# Patient Record
Sex: Female | Born: 1950 | Race: Black or African American | Hispanic: No | Marital: Single | Smoking: Never smoker
Health system: Southern US, Community
[De-identification: ages and names within clinical notes are randomized; demographics above are authoritative.]

## PROBLEM LIST (undated history)

## (undated) DIAGNOSIS — R0683 Snoring: Secondary | ICD-10-CM

## (undated) DIAGNOSIS — F329 Major depressive disorder, single episode, unspecified: Secondary | ICD-10-CM

## (undated) DIAGNOSIS — Z8679 Personal history of other diseases of the circulatory system: Secondary | ICD-10-CM

## (undated) DIAGNOSIS — G4700 Insomnia, unspecified: Secondary | ICD-10-CM

## (undated) DIAGNOSIS — Z923 Personal history of irradiation: Secondary | ICD-10-CM

## (undated) DIAGNOSIS — F419 Anxiety disorder, unspecified: Secondary | ICD-10-CM

## (undated) DIAGNOSIS — H269 Unspecified cataract: Secondary | ICD-10-CM

## (undated) DIAGNOSIS — I10 Essential (primary) hypertension: Secondary | ICD-10-CM

## (undated) DIAGNOSIS — M1990 Unspecified osteoarthritis, unspecified site: Secondary | ICD-10-CM

## (undated) DIAGNOSIS — E785 Hyperlipidemia, unspecified: Secondary | ICD-10-CM

## (undated) DIAGNOSIS — C50919 Malignant neoplasm of unspecified site of unspecified female breast: Secondary | ICD-10-CM

## (undated) DIAGNOSIS — F410 Panic disorder [episodic paroxysmal anxiety] without agoraphobia: Secondary | ICD-10-CM

## (undated) DIAGNOSIS — J301 Allergic rhinitis due to pollen: Secondary | ICD-10-CM

## (undated) DIAGNOSIS — F32A Depression, unspecified: Secondary | ICD-10-CM

## (undated) DIAGNOSIS — Z9221 Personal history of antineoplastic chemotherapy: Secondary | ICD-10-CM

## (undated) DIAGNOSIS — E039 Hypothyroidism, unspecified: Secondary | ICD-10-CM

## (undated) HISTORY — PX: LYMPH NODE DISSECTION: SHX5087

## (undated) HISTORY — PX: UTERINE FIBROID SURGERY: SHX826

## (undated) HISTORY — PX: REDUCTION MAMMAPLASTY: SUR839

## (undated) HISTORY — PX: TONSILLECTOMY: SUR1361

## (undated) HISTORY — DX: Malignant neoplasm of unspecified site of unspecified female breast: C50.919

## (undated) HISTORY — PX: GANGLION CYST EXCISION: SHX1691

---

## 1993-09-16 DIAGNOSIS — C50919 Malignant neoplasm of unspecified site of unspecified female breast: Secondary | ICD-10-CM | POA: Insufficient documentation

## 1993-09-16 DIAGNOSIS — Z923 Personal history of irradiation: Secondary | ICD-10-CM

## 1993-09-16 HISTORY — PX: BREAST LUMPECTOMY: SHX2

## 1993-09-16 HISTORY — DX: Malignant neoplasm of unspecified site of unspecified female breast: C50.919

## 1993-09-16 HISTORY — DX: Personal history of irradiation: Z92.3

## 2004-11-15 ENCOUNTER — Encounter: Admission: RE | Admit: 2004-11-15 | Discharge: 2004-11-15 | Payer: Self-pay | Admitting: Oncology

## 2004-11-26 ENCOUNTER — Encounter: Admission: RE | Admit: 2004-11-26 | Discharge: 2004-11-26 | Payer: Self-pay | Admitting: Oncology

## 2004-11-30 ENCOUNTER — Ambulatory Visit: Payer: Self-pay | Admitting: Oncology

## 2005-12-04 ENCOUNTER — Encounter: Admission: RE | Admit: 2005-12-04 | Discharge: 2005-12-04 | Payer: Self-pay | Admitting: Internal Medicine

## 2006-07-18 ENCOUNTER — Ambulatory Visit (HOSPITAL_COMMUNITY): Admission: RE | Admit: 2006-07-18 | Discharge: 2006-07-18 | Payer: Self-pay | Admitting: Gastroenterology

## 2006-09-09 ENCOUNTER — Emergency Department (HOSPITAL_COMMUNITY): Admission: EM | Admit: 2006-09-09 | Discharge: 2006-09-09 | Payer: Self-pay | Admitting: Emergency Medicine

## 2006-09-18 ENCOUNTER — Encounter: Admission: RE | Admit: 2006-09-18 | Discharge: 2006-09-18 | Payer: Self-pay | Admitting: Internal Medicine

## 2007-01-07 ENCOUNTER — Encounter: Admission: RE | Admit: 2007-01-07 | Discharge: 2007-01-07 | Payer: Self-pay | Admitting: Internal Medicine

## 2007-03-16 ENCOUNTER — Emergency Department (HOSPITAL_COMMUNITY): Admission: EM | Admit: 2007-03-16 | Discharge: 2007-03-16 | Payer: Self-pay | Admitting: Family Medicine

## 2007-03-16 LAB — CONVERTED CEMR LAB: Pap Smear: NORMAL

## 2007-11-30 ENCOUNTER — Other Ambulatory Visit: Admission: RE | Admit: 2007-11-30 | Discharge: 2007-11-30 | Payer: Self-pay | Admitting: Family Medicine

## 2008-03-31 ENCOUNTER — Encounter: Admission: RE | Admit: 2008-03-31 | Discharge: 2008-03-31 | Payer: Self-pay | Admitting: Internal Medicine

## 2008-06-29 ENCOUNTER — Encounter (INDEPENDENT_AMBULATORY_CARE_PROVIDER_SITE_OTHER): Payer: Self-pay | Admitting: *Deleted

## 2008-07-21 ENCOUNTER — Emergency Department (HOSPITAL_COMMUNITY): Admission: EM | Admit: 2008-07-21 | Discharge: 2008-07-21 | Payer: Self-pay | Admitting: Family Medicine

## 2008-07-24 ENCOUNTER — Emergency Department (HOSPITAL_COMMUNITY): Admission: EM | Admit: 2008-07-24 | Discharge: 2008-07-25 | Payer: Self-pay | Admitting: Emergency Medicine

## 2008-09-29 ENCOUNTER — Ambulatory Visit: Payer: Self-pay | Admitting: *Deleted

## 2008-09-29 DIAGNOSIS — N760 Acute vaginitis: Secondary | ICD-10-CM | POA: Insufficient documentation

## 2008-09-29 DIAGNOSIS — G4700 Insomnia, unspecified: Secondary | ICD-10-CM | POA: Insufficient documentation

## 2008-09-29 DIAGNOSIS — F3289 Other specified depressive episodes: Secondary | ICD-10-CM | POA: Insufficient documentation

## 2008-09-29 DIAGNOSIS — E782 Mixed hyperlipidemia: Secondary | ICD-10-CM | POA: Insufficient documentation

## 2008-09-29 DIAGNOSIS — E785 Hyperlipidemia, unspecified: Secondary | ICD-10-CM

## 2008-09-29 DIAGNOSIS — N898 Other specified noninflammatory disorders of vagina: Secondary | ICD-10-CM | POA: Insufficient documentation

## 2008-09-29 DIAGNOSIS — I10 Essential (primary) hypertension: Secondary | ICD-10-CM | POA: Insufficient documentation

## 2008-09-29 DIAGNOSIS — F329 Major depressive disorder, single episode, unspecified: Secondary | ICD-10-CM | POA: Insufficient documentation

## 2008-09-30 DIAGNOSIS — Z853 Personal history of malignant neoplasm of breast: Secondary | ICD-10-CM | POA: Insufficient documentation

## 2008-10-18 ENCOUNTER — Telehealth: Payer: Self-pay | Admitting: Internal Medicine

## 2008-10-19 ENCOUNTER — Ambulatory Visit: Payer: Self-pay | Admitting: Internal Medicine

## 2008-10-19 ENCOUNTER — Encounter (INDEPENDENT_AMBULATORY_CARE_PROVIDER_SITE_OTHER): Payer: Self-pay | Admitting: *Deleted

## 2008-10-19 LAB — CONVERTED CEMR LAB
Clue Cells Wet Prep HPF POC: NONE SEEN
Trich, Wet Prep: NONE SEEN
Yeast Wet Prep HPF POC: NONE SEEN

## 2008-10-25 ENCOUNTER — Ambulatory Visit: Payer: Self-pay | Admitting: Diagnostic Radiology

## 2008-10-25 ENCOUNTER — Emergency Department (HOSPITAL_BASED_OUTPATIENT_CLINIC_OR_DEPARTMENT_OTHER): Admission: EM | Admit: 2008-10-25 | Discharge: 2008-10-25 | Payer: Self-pay | Admitting: Emergency Medicine

## 2009-07-13 ENCOUNTER — Encounter: Admission: RE | Admit: 2009-07-13 | Discharge: 2009-07-13 | Payer: Self-pay | Admitting: Internal Medicine

## 2009-08-26 ENCOUNTER — Inpatient Hospital Stay (HOSPITAL_COMMUNITY): Admission: EM | Admit: 2009-08-26 | Discharge: 2009-08-28 | Payer: Self-pay | Admitting: Emergency Medicine

## 2009-12-27 ENCOUNTER — Encounter: Admission: RE | Admit: 2009-12-27 | Discharge: 2009-12-27 | Payer: Self-pay | Admitting: Obstetrics and Gynecology

## 2010-03-29 ENCOUNTER — Inpatient Hospital Stay (HOSPITAL_COMMUNITY): Admission: EM | Admit: 2010-03-29 | Discharge: 2010-03-31 | Payer: Self-pay | Admitting: Emergency Medicine

## 2010-03-29 ENCOUNTER — Ambulatory Visit: Payer: Self-pay | Admitting: Cardiovascular Disease

## 2010-03-30 ENCOUNTER — Ambulatory Visit: Payer: Self-pay | Admitting: Vascular Surgery

## 2010-03-30 ENCOUNTER — Encounter (INDEPENDENT_AMBULATORY_CARE_PROVIDER_SITE_OTHER): Payer: Self-pay | Admitting: Internal Medicine

## 2010-10-07 ENCOUNTER — Encounter: Payer: Self-pay | Admitting: Internal Medicine

## 2010-10-07 ENCOUNTER — Encounter: Payer: Self-pay | Admitting: Oncology

## 2010-12-01 LAB — BASIC METABOLIC PANEL
BUN: 15 mg/dL (ref 6–23)
CO2: 27 mEq/L (ref 19–32)
Calcium: 8.4 mg/dL (ref 8.4–10.5)
Chloride: 109 mEq/L (ref 96–112)
Creatinine, Ser: 0.88 mg/dL (ref 0.4–1.2)
GFR calc Af Amer: >60 mL/min (ref >60)
GFR calc non Af Amer: >60 mL/min (ref >60)
Glucose, Bld: 124 mg/dL — ABNORMAL HIGH (ref 70–99)
Potassium: 4.2 mEq/L (ref 3.5–5.1)
Sodium: 140 mEq/L (ref 135–145)

## 2010-12-01 LAB — GLUCOSE, CAPILLARY
Glucose-Capillary: 120 mg/dL — ABNORMAL HIGH (ref 70–99)
Glucose-Capillary: 122 mg/dL — ABNORMAL HIGH (ref 70–99)
Glucose-Capillary: 124 mg/dL — ABNORMAL HIGH (ref 70–99)
Glucose-Capillary: 98 mg/dL (ref 70–99)

## 2010-12-01 LAB — RAPID URINE DRUG SCREEN, HOSP PERFORMED
Amphetamines: NOT DETECTED
Barbiturates: NOT DETECTED
Benzodiazepines: POSITIVE — AB
Cocaine: NOT DETECTED
Opiates: NOT DETECTED
Tetrahydrocannabinol: NOT DETECTED

## 2010-12-01 LAB — CARDIAC PANEL(CRET KIN+CKTOT+MB+TROPI)
CK, MB: 1.1 ng/mL (ref 0.3–4.0)
CK, MB: 1.2 ng/mL (ref 0.3–4.0)
Relative Index: INVALID (ref 0.0–2.5)
Relative Index: INVALID (ref 0.0–2.5)
Total CK: 54 U/L (ref 7–177)
Total CK: 61 U/L (ref 7–177)
Troponin I: 0.02 ng/mL (ref 0.00–0.06)
Troponin I: 0.03 ng/mL (ref 0.00–0.06)

## 2010-12-01 LAB — COMPREHENSIVE METABOLIC PANEL
ALT: 17 U/L (ref 0–35)
AST: 16 U/L (ref 0–37)
Albumin: 3.8 g/dL (ref 3.5–5.2)
Alkaline Phosphatase: 43 U/L (ref 39–117)
BUN: 26 mg/dL — ABNORMAL HIGH (ref 6–23)
CO2: 24 mEq/L (ref 19–32)
Calcium: 8.9 mg/dL (ref 8.4–10.5)
Chloride: 105 mEq/L (ref 96–112)
Creatinine, Ser: 1.23 mg/dL — ABNORMAL HIGH (ref 0.4–1.2)
GFR calc Af Amer: 54 mL/min — ABNORMAL LOW (ref >60)
GFR calc non Af Amer: 45 mL/min — ABNORMAL LOW (ref >60)
Glucose, Bld: 109 mg/dL — ABNORMAL HIGH (ref 70–99)
Potassium: 3.7 mEq/L (ref 3.5–5.1)
Sodium: 137 mEq/L (ref 135–145)
Total Bilirubin: 0.6 mg/dL (ref 0.3–1.2)
Total Protein: 6.9 g/dL (ref 6.0–8.3)

## 2010-12-01 LAB — LIPID PANEL
Cholesterol: 179 mg/dL (ref 0–200)
HDL: 49 mg/dL (ref >39)
LDL Cholesterol: 96 mg/dL (ref 0–99)
Total CHOL/HDL Ratio: 3.7 RATIO
Triglycerides: 172 mg/dL — ABNORMAL HIGH (ref <150)
VLDL: 34 mg/dL (ref 0–40)

## 2010-12-01 LAB — CBC
HCT: 33.9 % — ABNORMAL LOW (ref 36.0–46.0)
Hemoglobin: 11.5 g/dL — ABNORMAL LOW (ref 12.0–15.0)
MCH: 31.6 pg (ref 26.0–34.0)
MCHC: 34.1 g/dL (ref 30.0–36.0)
MCV: 92.8 fL (ref 78.0–100.0)
Platelets: 254 10*3/uL (ref 150–400)
RBC: 3.65 MIL/uL — ABNORMAL LOW (ref 3.87–5.11)
RDW: 13.7 % (ref 11.5–15.5)
WBC: 6.6 10*3/uL (ref 4.0–10.5)

## 2010-12-01 LAB — HOMOCYSTEINE: Homocysteine: 14.3 umol/L (ref 4.0–15.4)

## 2010-12-01 LAB — DIFFERENTIAL
Basophils Absolute: 0 10*3/uL (ref 0.0–0.1)
Basophils Relative: 1 % (ref 0–1)
Eosinophils Absolute: 0.1 10*3/uL (ref 0.0–0.7)
Eosinophils Relative: 2 % (ref 0–5)
Lymphocytes Relative: 39 % (ref 12–46)
Lymphs Abs: 2.6 10*3/uL (ref 0.7–4.0)
Monocytes Absolute: 0.4 10*3/uL (ref 0.1–1.0)
Monocytes Relative: 6 % (ref 3–12)
Neutro Abs: 3.5 10*3/uL (ref 1.7–7.7)
Neutrophils Relative %: 54 % (ref 43–77)

## 2010-12-01 LAB — TSH: TSH: 3.906 u[IU]/mL (ref 0.350–4.500)

## 2010-12-01 LAB — PROTIME-INR
INR: 1.06 (ref 0.00–1.49)
Prothrombin Time: 13.7 seconds (ref 11.6–15.2)

## 2010-12-02 LAB — HEMOGLOBIN A1C
Hgb A1c MFr Bld: 5.4 % (ref <5.7)
Mean Plasma Glucose: 108 mg/dL (ref <117)

## 2010-12-02 LAB — CBC
HCT: 34.6 % — ABNORMAL LOW (ref 36.0–46.0)
Hemoglobin: 11.8 g/dL — ABNORMAL LOW (ref 12.0–15.0)
MCH: 31.3 pg (ref 26.0–34.0)
MCHC: 34 g/dL (ref 30.0–36.0)
MCV: 92 fL (ref 78.0–100.0)
Platelets: 234 10*3/uL (ref 150–400)
RBC: 3.76 MIL/uL — ABNORMAL LOW (ref 3.87–5.11)
RDW: 13.8 % (ref 11.5–15.5)
WBC: 6.7 10*3/uL (ref 4.0–10.5)

## 2010-12-02 LAB — DIFFERENTIAL
Basophils Absolute: 0 10*3/uL (ref 0.0–0.1)
Basophils Relative: 0 % (ref 0–1)
Eosinophils Absolute: 0.1 10*3/uL (ref 0.0–0.7)
Eosinophils Relative: 1 % (ref 0–5)
Lymphocytes Relative: 23 % (ref 12–46)
Lymphs Abs: 1.5 10*3/uL (ref 0.7–4.0)
Monocytes Absolute: 0.3 10*3/uL (ref 0.1–1.0)
Monocytes Relative: 5 % (ref 3–12)
Neutro Abs: 4.8 10*3/uL (ref 1.7–7.7)
Neutrophils Relative %: 71 % (ref 43–77)

## 2010-12-02 LAB — URINE CULTURE
Colony Count: NO GROWTH
Culture: NO GROWTH

## 2010-12-02 LAB — COMPREHENSIVE METABOLIC PANEL
ALT: 16 U/L (ref 0–35)
AST: 20 U/L (ref 0–37)
Albumin: 4 g/dL (ref 3.5–5.2)
Alkaline Phosphatase: 43 U/L (ref 39–117)
BUN: 32 mg/dL — ABNORMAL HIGH (ref 6–23)
CO2: 25 mEq/L (ref 19–32)
Calcium: 9.4 mg/dL (ref 8.4–10.5)
Chloride: 100 mEq/L (ref 96–112)
Creatinine, Ser: 2.02 mg/dL — ABNORMAL HIGH (ref 0.4–1.2)
GFR calc Af Amer: 31 mL/min — ABNORMAL LOW (ref >60)
GFR calc non Af Amer: 25 mL/min — ABNORMAL LOW (ref >60)
Glucose, Bld: 111 mg/dL — ABNORMAL HIGH (ref 70–99)
Potassium: 3.6 mEq/L (ref 3.5–5.1)
Sodium: 133 mEq/L — ABNORMAL LOW (ref 135–145)
Total Bilirubin: 0.3 mg/dL (ref 0.3–1.2)
Total Protein: 7.1 g/dL (ref 6.0–8.3)

## 2010-12-02 LAB — URINALYSIS, ROUTINE W REFLEX MICROSCOPIC
Bilirubin Urine: NEGATIVE
Glucose, UA: NEGATIVE mg/dL
Hgb urine dipstick: NEGATIVE
Ketones, ur: NEGATIVE mg/dL
Nitrite: NEGATIVE
Protein, ur: NEGATIVE mg/dL
Specific Gravity, Urine: 1.011 (ref 1.005–1.030)
Urobilinogen, UA: 0.2 mg/dL (ref 0.0–1.0)
pH: 5 (ref 5.0–8.0)

## 2010-12-02 LAB — APTT: aPTT: 25 seconds (ref 24–37)

## 2010-12-02 LAB — URINE MICROSCOPIC-ADD ON

## 2010-12-02 LAB — CARDIAC PANEL(CRET KIN+CKTOT+MB+TROPI)
CK, MB: 1.3 ng/mL (ref 0.3–4.0)
Relative Index: INVALID (ref 0.0–2.5)
Total CK: 59 U/L (ref 7–177)
Troponin I: 0.02 ng/mL (ref 0.00–0.06)

## 2010-12-02 LAB — POCT CARDIAC MARKERS
CKMB, poc: <1 ng/mL — ABNORMAL LOW (ref 1.0–8.0)
Myoglobin, poc: 217 ng/mL (ref 12–200)
Troponin i, poc: <0.05 ng/mL (ref 0.00–0.09)

## 2010-12-02 LAB — PROTIME-INR
INR: 1.01 (ref 0.00–1.49)
Prothrombin Time: 13.2 seconds (ref 11.6–15.2)

## 2010-12-02 LAB — CK: Total CK: 66 U/L (ref 7–177)

## 2010-12-18 LAB — BASIC METABOLIC PANEL
BUN: 20 mg/dL (ref 6–23)
BUN: 23 mg/dL (ref 6–23)
CO2: 24 mEq/L (ref 19–32)
CO2: 27 mEq/L (ref 19–32)
Calcium: 9.3 mg/dL (ref 8.4–10.5)
Calcium: 9.3 mg/dL (ref 8.4–10.5)
Chloride: 101 mEq/L (ref 96–112)
Chloride: 98 mEq/L (ref 96–112)
Creatinine, Ser: 0.84 mg/dL (ref 0.4–1.2)
Creatinine, Ser: 0.86 mg/dL (ref 0.4–1.2)
GFR calc Af Amer: >60 mL/min (ref >60)
GFR calc Af Amer: >60 mL/min (ref >60)
GFR calc non Af Amer: >60 mL/min (ref >60)
GFR calc non Af Amer: >60 mL/min (ref >60)
Glucose, Bld: 107 mg/dL — ABNORMAL HIGH (ref 70–99)
Glucose, Bld: 98 mg/dL (ref 70–99)
Potassium: 3.3 mEq/L — ABNORMAL LOW (ref 3.5–5.1)
Potassium: 3.5 mEq/L (ref 3.5–5.1)
Sodium: 134 mEq/L — ABNORMAL LOW (ref 135–145)
Sodium: 134 mEq/L — ABNORMAL LOW (ref 135–145)

## 2010-12-18 LAB — DIFFERENTIAL
Basophils Absolute: 0 K/uL (ref 0.0–0.1)
Basophils Relative: 1 % (ref 0–1)
Eosinophils Absolute: 0 K/uL (ref 0.0–0.7)
Eosinophils Relative: 0 % (ref 0–5)
Lymphocytes Relative: 15 % (ref 12–46)
Lymphs Abs: 1.1 K/uL (ref 0.7–4.0)
Monocytes Absolute: 0.2 K/uL (ref 0.1–1.0)
Monocytes Relative: 3 % (ref 3–12)
Neutro Abs: 6.2 K/uL (ref 1.7–7.7)
Neutrophils Relative %: 81 % — ABNORMAL HIGH (ref 43–77)

## 2010-12-18 LAB — CBC
HCT: 38.7 % (ref 36.0–46.0)
HCT: 38.8 % (ref 36.0–46.0)
Hemoglobin: 12.9 g/dL (ref 12.0–15.0)
Hemoglobin: 13.1 g/dL (ref 12.0–15.0)
MCHC: 33.4 g/dL (ref 30.0–36.0)
MCHC: 33.7 g/dL (ref 30.0–36.0)
MCV: 91.3 fL (ref 78.0–100.0)
MCV: 91.3 fL (ref 78.0–100.0)
Platelets: 302 10*3/uL (ref 150–400)
Platelets: 312 K/uL (ref 150–400)
RBC: 4.24 MIL/uL (ref 3.87–5.11)
RBC: 4.25 MIL/uL (ref 3.87–5.11)
RDW: 12.6 % (ref 11.5–15.5)
RDW: 13.3 % (ref 11.5–15.5)
WBC: 7.5 K/uL (ref 4.0–10.5)
WBC: 9.6 10*3/uL (ref 4.0–10.5)

## 2010-12-18 LAB — HOMOCYSTEINE: Homocysteine: 9.7 umol/L (ref 4.0–15.4)

## 2010-12-18 LAB — CK TOTAL AND CKMB (NOT AT ARMC)
CK, MB: 1.1 ng/mL (ref 0.3–4.0)
Relative Index: INVALID (ref 0.0–2.5)
Total CK: 68 U/L (ref 7–177)

## 2010-12-18 LAB — POCT CARDIAC MARKERS
CKMB, poc: <1 ng/mL — ABNORMAL LOW (ref 1.0–8.0)
CKMB, poc: <1 ng/mL — ABNORMAL LOW (ref 1.0–8.0)
Myoglobin, poc: 78.3 ng/mL (ref 12–200)
Myoglobin, poc: 88.1 ng/mL (ref 12–200)
Troponin i, poc: <0.05 ng/mL (ref 0.00–0.09)
Troponin i, poc: <0.05 ng/mL (ref 0.00–0.09)

## 2010-12-18 LAB — LIPID PANEL
Cholesterol: 216 mg/dL — ABNORMAL HIGH (ref 0–200)
HDL: 69 mg/dL (ref >39)
LDL Cholesterol: 125 mg/dL — ABNORMAL HIGH (ref 0–99)
Total CHOL/HDL Ratio: 3.1 RATIO
Triglycerides: 110 mg/dL (ref <150)
VLDL: 22 mg/dL (ref 0–40)

## 2010-12-18 LAB — CARDIAC PANEL(CRET KIN+CKTOT+MB+TROPI)
CK, MB: 1.1 ng/mL (ref 0.3–4.0)
CK, MB: 1.2 ng/mL (ref 0.3–4.0)
Relative Index: INVALID (ref 0.0–2.5)
Relative Index: INVALID (ref 0.0–2.5)
Total CK: 55 U/L (ref 7–177)
Total CK: 55 U/L (ref 7–177)
Troponin I: 0.02 ng/mL (ref 0.00–0.06)
Troponin I: 0.04 ng/mL (ref 0.00–0.06)

## 2010-12-18 LAB — TROPONIN I: Troponin I: 0.02 ng/mL (ref 0.00–0.06)

## 2010-12-18 LAB — TSH: TSH: 2.227 u[IU]/mL (ref 0.350–4.500)

## 2011-01-09 ENCOUNTER — Other Ambulatory Visit: Payer: Self-pay | Admitting: Obstetrics and Gynecology

## 2011-01-09 DIAGNOSIS — Z1231 Encounter for screening mammogram for malignant neoplasm of breast: Secondary | ICD-10-CM

## 2011-01-15 ENCOUNTER — Ambulatory Visit
Admission: RE | Admit: 2011-01-15 | Discharge: 2011-01-15 | Disposition: A | Discharge status: Home / Self Care | Payer: Medicare Other | Source: Ambulatory Visit | Attending: Obstetrics and Gynecology | Admitting: Obstetrics and Gynecology

## 2011-01-15 DIAGNOSIS — Z1231 Encounter for screening mammogram for malignant neoplasm of breast: Secondary | ICD-10-CM

## 2011-01-17 ENCOUNTER — Other Ambulatory Visit: Payer: Self-pay | Admitting: Obstetrics and Gynecology

## 2011-01-17 DIAGNOSIS — R928 Other abnormal and inconclusive findings on diagnostic imaging of breast: Secondary | ICD-10-CM

## 2011-01-22 ENCOUNTER — Other Ambulatory Visit: Payer: Medicare Other

## 2011-01-23 ENCOUNTER — Other Ambulatory Visit: Payer: Medicare Other

## 2011-01-29 ENCOUNTER — Ambulatory Visit
Admission: RE | Admit: 2011-01-29 | Discharge: 2011-01-29 | Disposition: A | Discharge status: Home / Self Care | Payer: Medicare Other | Source: Ambulatory Visit | Attending: Obstetrics and Gynecology | Admitting: Obstetrics and Gynecology

## 2011-01-29 DIAGNOSIS — R928 Other abnormal and inconclusive findings on diagnostic imaging of breast: Secondary | ICD-10-CM

## 2011-02-01 NOTE — Op Note (Signed)
NAME:  Elaine Simon, Elaine Simon         ACCOUNT NO.:  [id number]   MEDICAL RECORD NO.:  [id number]          PATIENT TYPE:  AMB   LOCATION:  ENDO                         FACILITY:  [facility]   PHYSICIAN:  Anselmo Rod, M.D.  DATE OF BIRTH:  18-Jul-1951   DATE OF PROCEDURE:  07/18/2006  DATE OF DISCHARGE:                                 OPERATIVE REPORT   PROCEDURE:  Screening colonoscopy.   ENDOSCOPIST:  Anselmo Rod, M.D.   INSTRUMENT USED:  Olympus video colonoscope.   INDICATIONS FOR PROCEDURE:  60 year old African American female with a  personal history of breast cancer undergoing screening colonoscopy to rule  out colonic polyps, masses, etc.   PREPROCEDURE PREPARATION:  Informed consent was obtained from the patient.  The patient was fasted for four hours prior to the procedure and prepped  with 20 Osmoprep pills the night before and 12 Osmoprep pills the morning of  the procedure.  The risks and benefits of the procedure including a 10% miss  rate of cancer and polyps were discussed with the patient, as well.   PREPROCEDURE PHYSICAL:  Patient with stable vital signs.  Neck supple.  Chest clear to auscultation.  S1 and S2 regular.  Abdomen soft with normal  bowel sounds.   DESCRIPTION OF PROCEDURE:  The patient was placed in the left lateral  decubitus position, sedated with 125 mcg of Fentanyl and 12 mg Versed given  intravenously in slow incremental doses.  Once the patient was adequately  sedated, maintained on low flow oxygen and continuous cardiac monitoring,  the Olympus video colonoscope was advanced from the rectum to the cecum.  The appendiceal orifice and ileocecal valve were clearly visualized and  photographed.  The terminal ileum appeared normal.  A few sigmoid  diverticula were noted, some of them had inspissated stool in them.  Small  internal hemorrhoids were seen on retroflexion.  There were no polyps or  masses identified.  The patient tolerated the  procedure well without  immediate complications.   IMPRESSION:  1. Small non-bleeding internal hemorrhoids.  2. A few sigmoid diverticula.  3. Otherwise, normal colon to the terminal ileum.   RECOMMENDATIONS:  1. Continue high fiber diet with liberal fluid intake.  2. Repeat colonoscopy is recommended in the next five years unless the      patient develops any abnormal symptoms in the interim in which case she      should contact the office immediately.  3. Outpatient follow up as need arises in the future.      Anselmo Rod, M.D.  Electronically Signed     JNM/MEDQ  D:  07/18/2006  T:  07/18/2006  Job:  [id number]   cc:   Olene Craven, M.D.

## 2011-12-27 ENCOUNTER — Telehealth: Payer: Self-pay | Admitting: *Deleted

## 2011-12-27 NOTE — Telephone Encounter (Signed)
Confirmed 01/09/12 appt w/ pt.  Mailed before appt letter & packet to pt.  Took paperwork to Med Rec for chart.  Called referring to make them aware.

## 2011-12-27 NOTE — Telephone Encounter (Signed)
Referral faxed in on 12/19/11, no office notes. Patient was originally referred back in 2006, however never established with our office. No previous office visits found in [place]. Patient history of breast cancer 17years ago. Needs to establish and be followed by Oncology. 12/27/11--Rec'd office notes to complete referral.

## 2012-01-03 ENCOUNTER — Other Ambulatory Visit: Payer: Self-pay | Admitting: *Deleted

## 2012-01-03 DIAGNOSIS — Z853 Personal history of malignant neoplasm of breast: Secondary | ICD-10-CM

## 2012-01-09 ENCOUNTER — Encounter: Payer: Self-pay | Admitting: Oncology

## 2012-01-09 ENCOUNTER — Telehealth: Payer: Self-pay | Admitting: Oncology

## 2012-01-09 ENCOUNTER — Ambulatory Visit (HOSPITAL_BASED_OUTPATIENT_CLINIC_OR_DEPARTMENT_OTHER): Payer: Medicare Other | Admitting: Oncology

## 2012-01-09 ENCOUNTER — Other Ambulatory Visit (HOSPITAL_BASED_OUTPATIENT_CLINIC_OR_DEPARTMENT_OTHER): Payer: Medicare Other | Admitting: Lab

## 2012-01-09 ENCOUNTER — Ambulatory Visit: Payer: Medicare Other

## 2012-01-09 VITALS — BP 100/68 | HR 61 | Temp 98.4°F | Ht 62.0 in | Wt 172.1 lb

## 2012-01-09 DIAGNOSIS — Z853 Personal history of malignant neoplasm of breast: Secondary | ICD-10-CM

## 2012-01-09 DIAGNOSIS — F411 Generalized anxiety disorder: Secondary | ICD-10-CM

## 2012-01-09 DIAGNOSIS — E039 Hypothyroidism, unspecified: Secondary | ICD-10-CM

## 2012-01-09 DIAGNOSIS — F529 Unspecified sexual dysfunction not due to a substance or known physiological condition: Secondary | ICD-10-CM

## 2012-01-09 LAB — CBC WITH DIFFERENTIAL/PLATELET
BASO%: 0.8 % (ref 0.0–2.0)
Basophils Absolute: 0 10*3/uL (ref 0.0–0.1)
EOS%: 0.7 % (ref 0.0–7.0)
Eosinophils Absolute: 0 10*3/uL (ref 0.0–0.5)
HCT: 38.6 % (ref 34.8–46.6)
HGB: 12.7 g/dL (ref 11.6–15.9)
LYMPH%: 41.7 % (ref 14.0–49.7)
MCH: 29.9 pg (ref 25.1–34.0)
MCHC: 33 g/dL (ref 31.5–36.0)
MCV: 90.6 fL (ref 79.5–101.0)
MONO#: 0.3 10*3/uL (ref 0.1–0.9)
MONO%: 5.2 % (ref 0.0–14.0)
NEUT#: 2.6 10*3/uL (ref 1.5–6.5)
NEUT%: 51.6 % (ref 38.4–76.8)
Platelets: 322 10*3/uL (ref 145–400)
RBC: 4.26 10*6/uL (ref 3.70–5.45)
RDW: 13.7 % (ref 11.2–14.5)
WBC: 5 10*3/uL (ref 3.9–10.3)
lymph#: 2.1 10*3/uL (ref 0.9–3.3)
nRBC: 0 % (ref 0–0)

## 2012-01-09 LAB — COMPREHENSIVE METABOLIC PANEL
ALT: 13 U/L (ref 0–35)
AST: 16 U/L (ref 0–37)
Albumin: 4 g/dL (ref 3.5–5.2)
Alkaline Phosphatase: 58 U/L (ref 39–117)
BUN: 11 mg/dL (ref 6–23)
CO2: 27 mEq/L (ref 19–32)
Calcium: 9.9 mg/dL (ref 8.4–10.5)
Chloride: 104 mEq/L (ref 96–112)
Creatinine, Ser: 1.06 mg/dL (ref 0.50–1.10)
Glucose, Bld: 103 mg/dL — ABNORMAL HIGH (ref 70–99)
Potassium: 4 mEq/L (ref 3.5–5.3)
Sodium: 138 mEq/L (ref 135–145)
Total Bilirubin: 0.3 mg/dL (ref 0.3–1.2)
Total Protein: 7.6 g/dL (ref 6.0–8.3)

## 2012-01-09 LAB — CANCER ANTIGEN 27.29: CA 27.29: 18 U/mL (ref 0–39)

## 2012-01-09 NOTE — Telephone Encounter (Signed)
gve the pt her may,april 2014 appt calendar along with the appt to see dr Wayland Denis in april

## 2012-01-09 NOTE — Patient Instructions (Signed)
1. Referral to Plastic surgery made  2. Refer to Survivor clinic to see Colman Cater NP in 1 month to discuss survivor issues  3. See Dr. Welton Flakes  In 1 year

## 2012-01-09 NOTE — Progress Notes (Signed)
Elaine Simon [id number] 18-Jun-1951 61 y.o. 01/09/2012 4:34 PM  CC  Paulo Fruit, MD No address on file Dr. [name]  REASON FOR CONSULTATION:  61 year old female with history of left breast cancer originally diagnosed in 1995 patient is a 17 year breast cancer survivor.  STAGE:  Per patient recollection she was a stage I  REFERRING PHYSICIAN: Dr. [name]  HISTORY OF PRESENT ILLNESS:  Elaine Simon is a 61 y.o. female.  With oncologic history dating back to 1995 when she on self breast examination found a left breast lump. At that time she was in [facility]. She went on to have a mammogram and a biopsy. In [place] patient tells me that they recommended that she have a mastectomy. However patient sought out a second opinion at [facility]. Where she was recommended lumpectomy with axillary lymph node dissection.She had this performed in 1995. She tells me that she was found to have a stage I cancer. 19 lymph nodes were negative for metastatic disease. Apparently it was estrogen receptor positive. Post lumpectomy patient went on to receive what sounds like adjuvant chemotherapy. She cannot recall the medications but from her description it seems that she may have received Adriamycin and Cytoxan for a total of 4-6 cycles. Once she completed bad she tells me that she went on to receive radiation therapy to the left breast. She was then recommended tamoxifen and patient declined. However several years ago she started taking Evista for osteoporosis and she tells me that she has been told by her previous oncologist and physicians that Evista will be helping her breast cancer risk reduction as well. She was recently seen by her primary care physician. She tells me that she has not been seen by an oncologist and she wanted to set up an appointment to see medical oncology for ongoing surveillance.  Patient has several survivor issues going on including  lack of libido anxiety over her recurrence risk after all these years. She also is concerned about fatigue and just not having the energy to do things. She is concerned about her cosmetic outcome with her left breast being much smaller than her right breast. She also has developed hypothyroidism and she is wondering if the radiation she received may have caused this. She also has significant depression. She is on Lipid lowering agent.   Past Medical History: Past Medical History  Diagnosis Date  . Breast cancer 1995  . HYPERLIPIDEMIA 09/29/2008    Qualifier: Diagnosis of  By: Andrey Campanile MD, Raliegh Ip    . DEPRESSION 09/29/2008    Qualifier: Diagnosis of  By: Andrey Campanile MD, Raliegh Ip    . HYPERTENSION 09/29/2008    Qualifier: Diagnosis of  By: Andrey Campanile MD, Raliegh Ip    . INSOMNIA 09/29/2008    Qualifier: Diagnosis of  By: Andrey Campanile MD, Raliegh Ip    . BACTERIAL VAGINITIS 09/29/2008    Qualifier: Diagnosis of  By: Andrey Campanile MD, Raliegh Ip      Past Surgical History: Past Surgical History  Procedure Date  . Lumpecymy right 1995    Family History: No family history of breast cancer ovarian cancer or any other malignancy Social History History  Substance Use Topics  . Smoking status: Never Smoker   . Smokeless tobacco: Not on file  . Alcohol Use: No    Allergies: Allergies  Allergen Reactions  . Codeine     Current Medications: Current Outpatient Prescriptions  Medication Sig Dispense Refill  . albuterol (PROVENTIL) (2.5 MG/3ML) 0.083% nebulizer solution Take 2.5  mg by nebulization 4 (four) times daily.      Marland Kitchen ALPRAZolam (XANAX) 1 MG tablet Take 1 mg by mouth as needed.      . DULoxetine (CYMBALTA) 30 MG capsule Take 30 mg by mouth daily.      . fenofibrate (TRICOR) 145 MG tablet Take 145 mg by mouth daily.      Marland Kitchen levothyroxine (SYNTHROID, LEVOTHROID) 50 MCG tablet Take 50 mcg by mouth daily.      . phentermine 37.5 MG capsule Take 37.5 mg by mouth daily.      . pravastatin (PRAVACHOL) 40 MG tablet  Take 40 mg by mouth daily.      . raloxifene (EVISTA) 60 MG tablet Take 60 mg by mouth daily.      . traZODone (DESYREL) 50 MG tablet Take 50 mg by mouth at bedtime.        OB/GYN History:Patient is single she has no children she began having periods at the age of 37 she has never been on hormone replacement therapy  Fertility Discussion: N/A Prior History of Cancer: As stated above  Health Maintenance:  Colonoscopy Fall 2012 Bone Density 3 years ago Last PAP smear July 2012  ECOG PERFORMANCE STATUS: 1 - Symptomatic but completely ambulatory  Genetic Counseling/testing: Patient has never had any kind of genetic counseling or testing performed at the time of her original diagnosis of breast cancer back in 1995. I have taken a complete family history and she has no family history of any kind of malignancies. However due to the fact the patient was diagnosed at age less than 45 she could possibly be a candidate for counseling and testing. I have recommended this and she will be set up for with a referral.  REVIEW OF SYSTEMS:  Constitutional: positive for fatigue Ears, nose, mouth, throat, and face: positive for environmental allergy Respiratory: negative Cardiovascular: negative Gastrointestinal: negative Genitourinary:positive for sexual problems Integument/breast: positive for patient has her left breast smaller than her right breast Hematologic/lymphatic: negative Musculoskeletal:negative Neurological: positive for anxiety and depression Behavioral/Psych: positive for anxiety, depression, fatigue and sexual difficulty Endocrine: positive for hypothyroidism  PHYSICAL EXAMINATION: Blood pressure 100/68, pulse 61, temperature 98.4 F (36.9 C), temperature source Oral, height 5\' 2"  (1.575 m), weight 172 lb 1.6 oz (78.064 kg).  YNW:GNFAO, healthy, no distress, well nourished, well developed and anxious SKIN: skin color, texture, turgor are normal HEAD: Normocephalic EYES: PERRLA,  EOMI, sclera clear EARS: External ears normal OROPHARYNX:no exudate, no erythema and lips, buccal mucosa, and tongue normal  NECK: supple, no bruits, thyroid normal size, non-tender, without nodularity LYMPH:  no palpable lymphadenopathy, no hepatosplenomegaly BREAST:right breast normal without mass, skin or nipple changes or axillary nodes, surgical scars noted In the left breast without any nodularity or evidence of local recurrence no nipple inversion or retraction LUNGS: clear to auscultation and percussion HEART: regular rate & rhythm, no murmurs and no gallops ABDOMEN:abdomen soft, non-tender, normal bowel sounds and no masses or organomegaly BACK: Back symmetric, no curvature., No CVA tenderness EXTREMITIES:no edema, no clubbing, no cyanosis  NEURO: alert & oriented x 3 with fluent speech, no focal motor/sensory deficits, gait normal, reflexes normal and symmetric  STUDIES/RESULTS: No results found.   LABS:    Chemistry      Component Value Date/Time   NA 138 01/09/2012 1500   K 4.0 01/09/2012 1500   CL 104 01/09/2012 1500   CO2 27 01/09/2012 1500   BUN 11 01/09/2012 1500   CREATININE 1.06 01/09/2012 1500  Component Value Date/Time   CALCIUM 9.9 01/09/2012 1500   ALKPHOS 58 01/09/2012 1500   AST 16 01/09/2012 1500   ALT 13 01/09/2012 1500   BILITOT 0.3 01/09/2012 1500      Lab Results  Component Value Date   WBC 5.0 01/09/2012   HGB 12.7 01/09/2012   HCT 38.6 01/09/2012   MCV 90.6 01/09/2012   PLT 322 01/09/2012     ASSESSMENT    61 year old female with  #1 previous history of left breast cancer apparently stage I that was ER positive. Patient underwent a lumpectomy with axillary lymph node dissection. This revealed an invasive cancer. Pathology or previous medical history I have no Previous records available to me. Patient apparently had 19 axillary lymph nodes removed all of which were negative. She was then treated with adjuvant chemotherapy consisting of possibly  Adriamycin and Cytoxan. She then received radiation therapy. This was then recommended adjuvant tamoxifen but she declined. Patient is 17 years out since her original diagnosis and treatment.  #2 sexual dysfunction    #3 hypothyroidism  #4 anxiety about recurrence    PLAN:    #1 patient and I discussed her diagnosis and what she tripped got treated with. I think she was treated very aggressively. And she is now 17 years out from original diagnosis and is a true survivor.  #2 we discussed anxiety about recurrence. She understands that her risk of recurrence is quite low since she had what sounds like a favorable disease. I do think that surveillance is appropriate. She is recommended to continue having her mammograms as well as continuing self breast examinations and clinical examinations.  #3 we discussed genetic counseling and testing and she is open to this and I have made a referral for her.  #4 sexual dysfunction we did discuss this. Apparently she has been receiving compounded testosterone I am uncomfortable with this but I will make a referral to our survivor clinic and see what other options the patient may have. She certainly could use counseling.  #5 patient is referred to the survivor clinic in about a month's time.  #6 I will plan on seeing her back on a yearly basis.        Thank you so much for allowing me to participate in the care of Elaine Simon. I will continue to follow up the patient with you and assist in her care.  All questions were answered. The patient knows to call the clinic with any problems, questions or concerns. We can certainly see the patient much sooner if necessary.  I spent 55 minutes counseling the patient face to face. The total time spent in the appointment was 60 minutes.  Drue Second, MD Medical/Oncology [facility] [phone] (beeper) [phone] (Office)  01/09/2012, 4:34 PM 01/09/2012, 4:34 PM

## 2012-01-13 ENCOUNTER — Telehealth: Payer: Self-pay | Admitting: Oncology

## 2012-01-13 NOTE — Telephone Encounter (Signed)
gve  A copy of the pof to [employer] for the genetics testing appt with karen powell

## 2012-01-16 ENCOUNTER — Encounter: Payer: Self-pay | Admitting: *Deleted

## 2012-01-16 ENCOUNTER — Telehealth: Payer: Self-pay | Admitting: *Deleted

## 2012-01-16 NOTE — Progress Notes (Signed)
Mailed after appt letter to pt. 

## 2012-01-16 NOTE — Telephone Encounter (Signed)
Left message for patient to call me to schedule a genetics appt. 

## 2012-01-23 ENCOUNTER — Telehealth: Payer: Self-pay | Admitting: *Deleted

## 2012-01-23 NOTE — Telephone Encounter (Signed)
Confirmed 02/06/12 genetics appt w/ pt.

## 2012-02-06 ENCOUNTER — Other Ambulatory Visit: Payer: Medicare Other | Admitting: Lab

## 2012-02-06 ENCOUNTER — Ambulatory Visit (HOSPITAL_BASED_OUTPATIENT_CLINIC_OR_DEPARTMENT_OTHER): Payer: Medicare Other | Admitting: Genetic Counselor

## 2012-02-06 ENCOUNTER — Telehealth: Payer: Self-pay | Admitting: *Deleted

## 2012-02-06 DIAGNOSIS — Z853 Personal history of malignant neoplasm of breast: Secondary | ICD-10-CM

## 2012-02-06 NOTE — Telephone Encounter (Addendum)
Pt called [facility] states "  I've gone off the Tamoxifen because of the side effects I ws having."  Informed MD of pt's message. Pt to keep appts as scheduled.

## 2012-02-07 NOTE — Progress Notes (Signed)
Patient did not show for her appointment.

## 2012-02-13 ENCOUNTER — Ambulatory Visit: Payer: Medicare Other | Admitting: Family

## 2012-02-17 ENCOUNTER — Telehealth: Payer: Self-pay | Admitting: Medical Oncology

## 2012-02-17 NOTE — Telephone Encounter (Signed)
Received call from patient about appointments that she had missed, survivorship clinic and genetics.  Patient stated that "I didn't come to those appointments because frankly I was overwhelmed by the decisions that Odette Horns had made that same week and I started thinking that I was going to have to make those decisions and I just didn't know if I was prepared."  Informed patient that she would receive counseling along with having her blood drawn for BRCA 1 & 2, forwarded patient to Baltazar Apo to reschedule patient.  No other questions at this time from patient.

## 2012-03-26 ENCOUNTER — Encounter (HOSPITAL_BASED_OUTPATIENT_CLINIC_OR_DEPARTMENT_OTHER): Payer: Self-pay | Admitting: *Deleted

## 2012-03-27 DIAGNOSIS — N62 Hypertrophy of breast: Secondary | ICD-10-CM | POA: Diagnosis present

## 2012-03-30 ENCOUNTER — Other Ambulatory Visit: Payer: Self-pay | Admitting: Plastic Surgery

## 2012-03-30 NOTE — H&P (Signed)
History and Physical  Elaine Simon   03/27/2012 3:00 PM Office Visit  MRN: [id number]  Department:  Plastic Surgery  Dept Phone: [phone]  Description: Female DOB: 07/20/1951  Provider: Claire Sanger, DO   Diagnoses  -  Symptomatic mammary hypertrophy   - Primary   611.1     Reason for Visit  -  Breast Reduction     Subjective:    Patient ID: Elaine Simon is a 60 y.o. female.  HPI The patient is a 60 y.o. female here for her preoperative history and physical for right breast reduction. She was diagnosed with left breast cancer in 1995 after palpating a lump, mammagram and biopsy. She was living in [place] at that time. A mastectomy was recommended but the patient sought a second opinion at [facility] and underwent a lumpectomy with ALN dissection with 19 negative nodes (ER positive). She then had adjuvant chemotherapy and radiation to the left breast. She is unhappy with the asymmetry with a D size on the left and DD on the right breast. She also has depression and thyroid disease. She would like to be symmetric or at least be able to wear a bra that fits.   The following portions of the patient's history were reviewed and updated as appropriate: allergies, current medications, past family history, past medical history, past social history, past surgical history and problem list.  Review of Systems  Constitutional: Negative.   HENT: Negative.   Eyes: Negative.   Respiratory: Negative.   Cardiovascular: Negative.   Gastrointestinal: Negative.   Genitourinary: Negative.   Musculoskeletal: Negative.   Neurological: Negative.   Hematological: Negative.   Psychiatric/Behavioral: Negative.       Objective:    Physical Exam  Constitutional: She appears well-developed and well-nourished.  HENT:   Head: Normocephalic and atraumatic.  Eyes: Conjunctivae normal and EOM are normal. Pupils are equal, round, and reactive to light.  Neck: Normal range of motion.    Cardiovascular: Normal rate.   Pulmonary/Chest: Effort normal. No respiratory distress. She has no wheezes.  Abdominal: Soft. She exhibits no distension. There is no tenderness.  Musculoskeletal: Normal range of motion.  Neurological: She is alert.  Skin: Skin is warm.  Psychiatric: She has a normal mood and affect. Her behavior is normal. Judgment and thought content normal.      Assessment:  1.  Symptomatic mammary hypertrophy     Plan:   Risks and complications were reviewed and include bleeding, pain, scar, infection, and risk of anesthesia.  We will plan on right breast mastopexy reduction. Consent was reviewed and signed.   Medications Ordered in the office    cephalexin (KEFLEX) 500 MG capsule 28 Take 1 capsule (500 mg total) by mouth 4 times daily.    HYDROcodone-acetaminophen (NORCO) 5-325 mg per tablet 30 Take 1 tablet by mouth every 6 (six) hours as needed for 10 days for Pain.    docusate sodium (COLACE) 100 MG capsule 20 Take 1 capsule (100 mg total) by mouth 3 times daily.   promethazine (PHENERGAN) 12.5 MG tablet 10 Take 2 tablets (25 mg total) by mouth every 6 (six) hours as needed for 7 days for Nausea.                

## 2012-04-01 ENCOUNTER — Encounter (HOSPITAL_BASED_OUTPATIENT_CLINIC_OR_DEPARTMENT_OTHER): Payer: Self-pay | Admitting: Anesthesiology

## 2012-04-01 ENCOUNTER — Ambulatory Visit (HOSPITAL_BASED_OUTPATIENT_CLINIC_OR_DEPARTMENT_OTHER)
Admission: RE | Admit: 2012-04-01 | Discharge: 2012-04-01 | Disposition: A | Discharge status: Home / Self Care | Payer: Medicare Other | Source: Ambulatory Visit | Attending: Plastic Surgery | Admitting: Plastic Surgery

## 2012-04-01 ENCOUNTER — Encounter (HOSPITAL_BASED_OUTPATIENT_CLINIC_OR_DEPARTMENT_OTHER): Payer: Self-pay | Admitting: *Deleted

## 2012-04-01 ENCOUNTER — Encounter (HOSPITAL_BASED_OUTPATIENT_CLINIC_OR_DEPARTMENT_OTHER)
Admission: RE | Disposition: A | Discharge status: Home / Self Care | Payer: Self-pay | Source: Ambulatory Visit | Attending: Plastic Surgery

## 2012-04-01 ENCOUNTER — Ambulatory Visit (HOSPITAL_BASED_OUTPATIENT_CLINIC_OR_DEPARTMENT_OTHER): Payer: Medicare Other | Admitting: Anesthesiology

## 2012-04-01 DIAGNOSIS — E079 Disorder of thyroid, unspecified: Secondary | ICD-10-CM | POA: Insufficient documentation

## 2012-04-01 DIAGNOSIS — N651 Disproportion of reconstructed breast: Secondary | ICD-10-CM | POA: Insufficient documentation

## 2012-04-01 DIAGNOSIS — F329 Major depressive disorder, single episode, unspecified: Secondary | ICD-10-CM | POA: Insufficient documentation

## 2012-04-01 DIAGNOSIS — F3289 Other specified depressive episodes: Secondary | ICD-10-CM | POA: Insufficient documentation

## 2012-04-01 DIAGNOSIS — Z9221 Personal history of antineoplastic chemotherapy: Secondary | ICD-10-CM | POA: Insufficient documentation

## 2012-04-01 DIAGNOSIS — Z853 Personal history of malignant neoplasm of breast: Secondary | ICD-10-CM | POA: Insufficient documentation

## 2012-04-01 DIAGNOSIS — N62 Hypertrophy of breast: Secondary | ICD-10-CM | POA: Insufficient documentation

## 2012-04-01 DIAGNOSIS — Z923 Personal history of irradiation: Secondary | ICD-10-CM | POA: Insufficient documentation

## 2012-04-01 HISTORY — DX: Hypothyroidism, unspecified: E03.9

## 2012-04-01 HISTORY — DX: Allergic rhinitis due to pollen: J30.1

## 2012-04-01 HISTORY — PX: BREAST RECONSTRUCTION: SHX9

## 2012-04-01 HISTORY — DX: Hyperlipidemia, unspecified: E78.5

## 2012-04-01 HISTORY — DX: Snoring: R06.83

## 2012-04-01 HISTORY — DX: Unspecified cataract: H26.9

## 2012-04-01 HISTORY — DX: Unspecified osteoarthritis, unspecified site: M19.90

## 2012-04-01 HISTORY — DX: Major depressive disorder, single episode, unspecified: F32.9

## 2012-04-01 HISTORY — DX: Panic disorder (episodic paroxysmal anxiety): F41.0

## 2012-04-01 HISTORY — DX: Insomnia, unspecified: G47.00

## 2012-04-01 HISTORY — DX: Depression, unspecified: F32.A

## 2012-04-01 HISTORY — DX: Personal history of other diseases of the circulatory system: Z86.79

## 2012-04-01 HISTORY — DX: Anxiety disorder, unspecified: F41.9

## 2012-04-01 LAB — POCT HEMOGLOBIN-HEMACUE: Hemoglobin: 12.4 g/dL (ref 12.0–15.0)

## 2012-04-01 SURGERY — RECONSTRUCTION, BREAST
Anesthesia: General | Site: Breast | Laterality: Right | Wound class: Clean

## 2012-04-01 MED ORDER — LIDOCAINE HCL (CARDIAC) 20 MG/ML IV SOLN
INTRAVENOUS | Status: DC | PRN
  Administered 2012-04-01: 100 mg via INTRAVENOUS

## 2012-04-01 MED ORDER — MIDAZOLAM HCL 5 MG/5ML IJ SOLN
INTRAMUSCULAR | Status: DC | PRN
  Administered 2012-04-01: 2 mg via INTRAVENOUS

## 2012-04-01 MED ORDER — SUCCINYLCHOLINE CHLORIDE 20 MG/ML IJ SOLN
INTRAMUSCULAR | Status: DC | PRN
  Administered 2012-04-01: 100 mg via INTRAVENOUS

## 2012-04-01 MED ORDER — HYDROMORPHONE HCL PF 1 MG/ML IJ SOLN
0.2500 mg | INTRAMUSCULAR | Status: DC | PRN
  Administered 2012-04-01 (×4): 0.5 mg via INTRAVENOUS

## 2012-04-01 MED ORDER — OXYCODONE HCL 5 MG PO TABS
5.0000 mg | ORAL_TABLET | Freq: Once | ORAL | Status: AC | PRN
  Administered 2012-04-01: 5 mg via ORAL

## 2012-04-01 MED ORDER — SODIUM CHLORIDE 0.9 % IR SOLN
Status: DC | PRN
  Administered 2012-04-01: 09:00:00

## 2012-04-01 MED ORDER — CEFAZOLIN SODIUM-DEXTROSE 2-3 GM-% IV SOLR
2.0000 g | INTRAVENOUS | Status: AC
  Administered 2012-04-01: 2 g via INTRAVENOUS

## 2012-04-01 MED ORDER — DEXAMETHASONE SODIUM PHOSPHATE 4 MG/ML IJ SOLN
INTRAMUSCULAR | Status: DC | PRN
  Administered 2012-04-01: 10 mg via INTRAVENOUS

## 2012-04-01 MED ORDER — LACTATED RINGERS IV SOLN
INTRAVENOUS | Status: DC
  Administered 2012-04-01 (×2): via INTRAVENOUS

## 2012-04-01 MED ORDER — BUPIVACAINE-EPINEPHRINE 0.25% -1:200000 IJ SOLN
INTRAMUSCULAR | Status: DC | PRN
  Administered 2012-04-01: 30 mL

## 2012-04-01 MED ORDER — DROPERIDOL 2.5 MG/ML IJ SOLN
INTRAMUSCULAR | Status: DC | PRN
  Administered 2012-04-01: 0.625 mg via INTRAVENOUS

## 2012-04-01 MED ORDER — ONDANSETRON HCL 4 MG/2ML IJ SOLN
INTRAMUSCULAR | Status: DC | PRN
  Administered 2012-04-01: 4 mg via INTRAVENOUS

## 2012-04-01 MED ORDER — METOCLOPRAMIDE HCL 5 MG/ML IJ SOLN
10.0000 mg | Freq: Once | INTRAMUSCULAR | Status: DC | PRN
Start: 2012-04-01 — End: 2012-04-01

## 2012-04-01 MED ORDER — FENTANYL CITRATE 0.05 MG/ML IJ SOLN
INTRAMUSCULAR | Status: DC | PRN
  Administered 2012-04-01: 50 ug via INTRAVENOUS
  Administered 2012-04-01: 100 ug via INTRAVENOUS
  Administered 2012-04-01 (×2): 50 ug via INTRAVENOUS

## 2012-04-01 MED ORDER — ACETAMINOPHEN 10 MG/ML IV SOLN
1000.0000 mg | Freq: Once | INTRAVENOUS | Status: AC
  Administered 2012-04-01: 1000 mg via INTRAVENOUS

## 2012-04-01 MED ORDER — PROPOFOL 10 MG/ML IV EMUL
INTRAVENOUS | Status: DC | PRN
  Administered 2012-04-01: 200 mg via INTRAVENOUS

## 2012-04-01 SURGICAL SUPPLY — 66 items
ADH SKN CLS APL DERMABOND .7 (GAUZE/BANDAGES/DRESSINGS) ×2
BAG DECANTER FOR FLEXI CONT (MISCELLANEOUS) ×2 IMPLANT
BANDAGE GAUZE ELAST BULKY 4 IN (GAUZE/BANDAGES/DRESSINGS) ×4 IMPLANT
BINDER BREAST LRG (GAUZE/BANDAGES/DRESSINGS) IMPLANT
BINDER BREAST MEDIUM (GAUZE/BANDAGES/DRESSINGS) IMPLANT
BINDER BREAST XLRG (GAUZE/BANDAGES/DRESSINGS) IMPLANT
BINDER BREAST XXLRG (GAUZE/BANDAGES/DRESSINGS) IMPLANT
BLADE HEX COATED 2.75 (ELECTRODE) ×2 IMPLANT
BLADE SURG 10 STRL SS (BLADE) ×4 IMPLANT
BLADE SURG 15 STRL LF DISP TIS (BLADE) ×1 IMPLANT
BLADE SURG 15 STRL SS (BLADE) ×2
CANISTER SUCTION 1200CC (MISCELLANEOUS) ×1 IMPLANT
CHLORAPREP W/TINT 26ML (MISCELLANEOUS) ×2 IMPLANT
CLOTH BEACON ORANGE TIMEOUT ST (SAFETY) ×2 IMPLANT
COVER MAYO STAND STRL (DRAPES) ×2 IMPLANT
COVER TABLE BACK 60X90 (DRAPES) ×2 IMPLANT
DECANTER SPIKE VIAL GLASS SM (MISCELLANEOUS) IMPLANT
DERMABOND ADVANCED (GAUZE/BANDAGES/DRESSINGS) ×2
DERMABOND ADVANCED .7 DNX12 (GAUZE/BANDAGES/DRESSINGS) ×1 IMPLANT
DRAIN CHANNEL 19F RND (DRAIN) IMPLANT
DRAPE LAPAROSCOPIC ABDOMINAL (DRAPES) ×2 IMPLANT
DRSG PAD ABDOMINAL 8X10 ST (GAUZE/BANDAGES/DRESSINGS) ×4 IMPLANT
ELECT BLADE 4.0 EZ CLEAN MEGAD (MISCELLANEOUS) ×2
ELECT BLADE 6.5 .24CM SHAFT (ELECTRODE) IMPLANT
ELECT REM PT RETURN 9FT ADLT (ELECTROSURGICAL) ×2
ELECTRODE BLDE 4.0 EZ CLN MEGD (MISCELLANEOUS) ×1 IMPLANT
ELECTRODE REM PT RTRN 9FT ADLT (ELECTROSURGICAL) ×1 IMPLANT
EVACUATOR SILICONE 100CC (DRAIN) IMPLANT
GAUZE SPONGE 4X4 12PLY STRL LF (GAUZE/BANDAGES/DRESSINGS) IMPLANT
GLOVE BIO SURGEON STRL SZ 6.5 (GLOVE) ×4 IMPLANT
GLOVE ECLIPSE 6.5 STRL STRAW (GLOVE) ×1 IMPLANT
GOWN PREVENTION PLUS XLARGE (GOWN DISPOSABLE) ×5 IMPLANT
IV NS 1000ML (IV SOLUTION)
IV NS 1000ML BAXH (IV SOLUTION) IMPLANT
IV NS 500ML (IV SOLUTION)
IV NS 500ML BAXH (IV SOLUTION) ×1 IMPLANT
KIT FILL SYSTEM UNIVERSAL (SET/KITS/TRAYS/PACK) IMPLANT
NDL 21 GA WING INFUSION (NEEDLE) IMPLANT
NDL HYPO 25X1 1.5 SAFETY (NEEDLE) IMPLANT
NDL SPNL 22GX3.5 QUINCKE BK (NEEDLE) IMPLANT
NDL SPNL 25GX3.5 QUINCKE BL (NEEDLE) IMPLANT
NEEDLE 21 GA WING INFUSION (NEEDLE) IMPLANT
NEEDLE HYPO 25X1 1.5 SAFETY (NEEDLE) ×2 IMPLANT
NEEDLE SPNL 22GX3.5 QUINCKE BK (NEEDLE) ×2 IMPLANT
NEEDLE SPNL 25GX3.5 QUINCKE BL (NEEDLE) ×2 IMPLANT
NS IRRIG 1000ML POUR BTL (IV SOLUTION) IMPLANT
PACK BASIN DAY SURGERY FS (CUSTOM PROCEDURE TRAY) ×2 IMPLANT
PENCIL BUTTON HOLSTER BLD 10FT (ELECTRODE) ×2 IMPLANT
PIN SAFETY STERILE (MISCELLANEOUS) IMPLANT
SLEEVE SCD COMPRESS KNEE MED (MISCELLANEOUS) ×2 IMPLANT
SPONGE LAP 18X18 X RAY DECT (DISPOSABLE) ×5 IMPLANT
STRIP CLOSURE SKIN 1/2X4 (GAUZE/BANDAGES/DRESSINGS) IMPLANT
SUT MON AB 5-0 PS2 18 (SUTURE) ×5 IMPLANT
SUT PDS AB 2-0 CT2 27 (SUTURE) IMPLANT
SUT VIC AB 3-0 SH 27 (SUTURE) ×6
SUT VIC AB 3-0 SH 27X BRD (SUTURE) IMPLANT
SUT VIC AB 5-0 PS2 18 (SUTURE) ×1 IMPLANT
SUT VICRYL 4-0 PS2 18IN ABS (SUTURE) ×11 IMPLANT
SYR 50ML LL SCALE MARK (SYRINGE) IMPLANT
SYR BULB IRRIGATION 50ML (SYRINGE) ×2 IMPLANT
SYR CONTROL 10ML LL (SYRINGE) ×1 IMPLANT
TOWEL OR 17X24 6PK STRL BLUE (TOWEL DISPOSABLE) ×4 IMPLANT
TUBE CONNECTING 20X1/4 (TUBING) ×2 IMPLANT
UNDERPAD 30X30 INCONTINENT (UNDERPADS AND DIAPERS) ×4 IMPLANT
WATER STERILE IRR 1000ML POUR (IV SOLUTION) ×1 IMPLANT
YANKAUER SUCT BULB TIP NO VENT (SUCTIONS) ×2 IMPLANT

## 2012-04-01 NOTE — Anesthesia Preprocedure Evaluation (Signed)
Anesthesia Evaluation  Patient identified by MRN, date of birth, ID band Patient awake    Reviewed: Allergy & Precautions, H&P , NPO status , Patient's Chart, lab work & pertinent test results, reviewed documented beta blocker date and time   Airway Mallampati: II TM Distance: >3 FB Neck ROM: full    Dental   Pulmonary neg pulmonary ROS,          Cardiovascular hypertension, On Medications     Neuro/Psych PSYCHIATRIC DISORDERS Anxiety Depression negative neurological ROS     GI/Hepatic negative GI ROS, Neg liver ROS,   Endo/Other  Hypothyroidism   Renal/GU negative Renal ROS  negative genitourinary   Musculoskeletal   Abdominal   Peds  Hematology negative hematology ROS (+)   Anesthesia Other Findings See surgeon's H&P   Reproductive/Obstetrics negative OB ROS                           Anesthesia Physical Anesthesia Plan  ASA: II  Anesthesia Plan: General   Post-op Pain Management:    Induction: Intravenous  Airway Management Planned: Oral ETT  Additional Equipment:   Intra-op Plan:   Post-operative Plan: Extubation in OR  Informed Consent: I have reviewed the patients History and Physical, chart, labs and discussed the procedure including the risks, benefits and alternatives for the proposed anesthesia with the patient or authorized representative who has indicated his/her understanding and acceptance.   Dental Advisory Given  Plan Discussed with: CRNA and Surgeon  Anesthesia Plan Comments:         Anesthesia Quick Evaluation

## 2012-04-01 NOTE — Anesthesia Postprocedure Evaluation (Signed)
Anesthesia Post Note  Patient: Elaine Simon [facility]  Procedure(s) Performed: Procedure(s) (LRB): BREAST RECONSTRUCTION (Right)  Anesthesia type: General  Patient location: PACU  Post pain: Pain level controlled  Post assessment: Patient's Cardiovascular Status Stable  Last Vitals:  Filed Vitals:   04/01/12 1148  BP:   Pulse: 80  Temp:   Resp: 18    Post vital signs: Reviewed and stable  Level of consciousness: alert  Complications: No apparent anesthesia complications

## 2012-04-01 NOTE — Transfer of Care (Signed)
Immediate Anesthesia Transfer of Care Note  Patient: Elaine Simon [facility]  Procedure(s) Performed: Procedure(s) (LRB): BREAST RECONSTRUCTION (Right)  Patient Location: PACU  Anesthesia Type: General  Level of Consciousness: awake, alert  and oriented  Airway & Oxygen Therapy: Patient Spontanous Breathing and Patient connected to face mask oxygen  Post-op Assessment: Report given to PACU RN and Post -op Vital signs reviewed and stable  Post vital signs: Reviewed and stable  Complications: No apparent anesthesia complications

## 2012-04-01 NOTE — Brief Op Note (Signed)
04/01/2012  10:44 AM  PATIENT:  Elaine Simon  61 y.o. female  PRE-OPERATIVE DIAGNOSIS:  Right breast mammary hyperplasia  POST-OPERATIVE DIAGNOSIS:  Right breast mammary hyperplasia  PROCEDURE:  Procedure(s) (LRB): BREAST RECONSTRUCTION (Right)  SURGEON:  Surgeon(s) and Role:    * Cierria Height Sanger, DO - Primary  PHYSICIAN ASSISTANT:   ASSISTANTS: none   ANESTHESIA:   general  EBL:  Total I/O In: 1000 [I.V.:1000] Out: -   BLOOD ADMINISTERED:none  DRAINS: none   LOCAL MEDICATIONS USED:  MARCAINE     SPECIMEN:  Source of Specimen:  right breast tissue  DISPOSITION OF SPECIMEN:  PATHOLOGY  COUNTS:  YES  TOURNIQUET:  * No tourniquets in log *  DICTATION: dictated  PLAN OF CARE: Discharge to home after PACU  PATIENT DISPOSITION:  PACU - hemodynamically stable.   Delay start of Pharmacological VTE agent (>24hrs) due to surgical blood loss or risk of bleeding: no

## 2012-04-01 NOTE — H&P (View-Only) (Signed)
History and Physical  Robena Ewy   03/27/2012 3:00 PM Office Visit  MRN: [id number]  Department:  Plastic Surgery  Dept Phone: [phone]  Description: Female DOB: March 20, 1951  Provider: Wayland Denis, DO   Diagnoses  -  Symptomatic mammary hypertrophy   - Primary   611.1     Reason for Visit  -  Breast Reduction     Subjective:    Patient ID: Katlynne Mckercher is a 61 y.o. female.  HPI The patient is a 62 y.o. female here for her preoperative history and physical for right breast reduction. She was diagnosed with left breast cancer in 1995 after palpating a lump, mammagram and biopsy. She was living in [place] at that time. A mastectomy was recommended but the patient sought a second opinion at [facility] and underwent a lumpectomy with ALN dissection with 19 negative nodes (ER positive). She then had adjuvant chemotherapy and radiation to the left breast. She is unhappy with the asymmetry with a D size on the left and DD on the right breast. She also has depression and thyroid disease. She would like to be symmetric or at least be able to wear a bra that fits.   The following portions of the patient's history were reviewed and updated as appropriate: allergies, current medications, past family history, past medical history, past social history, past surgical history and problem list.  Review of Systems  Constitutional: Negative.   HENT: Negative.   Eyes: Negative.   Respiratory: Negative.   Cardiovascular: Negative.   Gastrointestinal: Negative.   Genitourinary: Negative.   Musculoskeletal: Negative.   Neurological: Negative.   Hematological: Negative.   Psychiatric/Behavioral: Negative.       Objective:    Physical Exam  Constitutional: She appears well-developed and well-nourished.  HENT:   Head: Normocephalic and atraumatic.  Eyes: Conjunctivae normal and EOM are normal. Pupils are equal, round, and reactive to light.  Neck: Normal range of motion.    Cardiovascular: Normal rate.   Pulmonary/Chest: Effort normal. No respiratory distress. She has no wheezes.  Abdominal: Soft. She exhibits no distension. There is no tenderness.  Musculoskeletal: Normal range of motion.  Neurological: She is alert.  Skin: Skin is warm.  Psychiatric: She has a normal mood and affect. Her behavior is normal. Judgment and thought content normal.      Assessment:  1.  Symptomatic mammary hypertrophy     Plan:   Risks and complications were reviewed and include bleeding, pain, scar, infection, and risk of anesthesia.  We will plan on right breast mastopexy reduction. Consent was reviewed and signed.   Medications Ordered in the office    cephalexin (KEFLEX) 500 MG capsule 28 Take 1 capsule (500 mg total) by mouth 4 times daily.    HYDROcodone-acetaminophen (NORCO) 5-325 mg per tablet 30 Take 1 tablet by mouth every 6 (six) hours as needed for 10 days for Pain.    docusate sodium (COLACE) 100 MG capsule 20 Take 1 capsule (100 mg total) by mouth 3 times daily.   promethazine (PHENERGAN) 12.5 MG tablet 10 Take 2 tablets (25 mg total) by mouth every 6 (six) hours as needed for 7 days for Nausea.

## 2012-04-01 NOTE — Interval H&P Note (Signed)
History and Physical Interval Note:  04/01/2012 7:44 AM  Elaine Simon  has presented today for surgery, with the diagnosis of breast cancer  The various methods of treatment have been discussed with the patient and family. After consideration of risks, benefits and other options for treatment, the patient has consented to  Procedure(s) (LRB): BREAST RECONSTRUCTION (Right) as a surgical intervention .  The patient's history has been reviewed, patient examined, no change in status, stable for surgery.  I have reviewed the patients' chart and labs.  Questions were answered to the patient's satisfaction.     SANGER,Antavious Spanos

## 2012-04-01 NOTE — Anesthesia Procedure Notes (Signed)
Procedure Name: Intubation Date/Time: 04/01/2012 8:36 AM Performed by: Zenia Resides D Pre-anesthesia Checklist: Patient identified, Emergency Drugs available, Suction available, Patient being monitored and Timeout performed Patient Re-evaluated:Patient Re-evaluated prior to inductionOxygen Delivery Method: Circle System Utilized Preoxygenation: Pre-oxygenation with 100% oxygen Intubation Type: IV induction Ventilation: Mask ventilation without difficulty Laryngoscope Size: Mac and 3 Grade View: Grade I Tube type: Oral Number of attempts: 1 Airway Equipment and Method: stylet and oral airway Placement Confirmation: ETT inserted through vocal cords under direct vision,  positive ETCO2 and breath sounds checked- equal and bilateral Secured at: 22 cm Tube secured with: Tape Dental Injury: Teeth and Oropharynx as per pre-operative assessment

## 2012-04-02 ENCOUNTER — Encounter (HOSPITAL_BASED_OUTPATIENT_CLINIC_OR_DEPARTMENT_OTHER): Payer: Self-pay | Admitting: Plastic Surgery

## 2012-04-02 NOTE — Op Note (Signed)
NAME:  Elaine Simon, Elaine Simon NO.:  [id number]  MEDICAL RECORD NO.:  [id number]  LOCATION:[facility]     FACILITY:[facility]  PHYSICIAN:  Wayland Denis, DO      DATE OF BIRTH:  May 27, 1951  DATE OF PROCEDURE:  04/01/2012 DATE OF DISCHARGE:                              OPERATIVE REPORT   PREOPERATIVE DIAGNOSIS:  Right mammary hypertrophy with asymmetry secondary to left breast cancer.  POSTOPERATIVE DIAGNOSIS:  Right mammary hypertrophy with asymmetry secondary to left breast cancer.  PROCEDURE:  Right breast reduction for symmetry.  ATTENDING:  Wayland Denis, DO.  ASSISTANT:  Shawn Rayburn, P.A.  ANESTHESIA:  General.  INDICATION FOR PROCEDURE:  The patient is a 61 year old female who underwent left-sided breast lumpectomy with radiation and had significant asymmetry.  Risks and complications were reviewed and included bleeding, pain, scar, risk of anesthesia, nipple areola loss, the discovery of breast cancer, and changes over time.  Consent was confirmed, and the patient wished to proceed.  DESCRIPTION OF PROCEDURE:  The patient was taken to the operating room, placed on the operating room table in the supine position.  General anesthesia was administered.  Once adequate, a time-out was called.  All information was confirmed to be correct.  She was prepped and draped in the usual sterile fashion.  A 0.25% bupivacaine with epinephrine was injected in the superior, lateral, and medial flaps.  After that was complete, the patient had been marked preoperatively.  So, a 10 blade was used to score the skin where the preop markings were made for the vertical limb and the horizontal limbs, as well as the pedicle.  The pedicle was 11 cm in width.  The sponge was placed around the base of the breast for purposes of counter tension and the pedicle was de- epithelialized.  Bovie was then used to dissect the medial and lateral limb, medial and lateral flaps,  and those were excised, and sent for await.  The medial and lateral flaps were dissected down to the chest wall, and kept at 1-2 cm in thickness.  The flap then was pinned at the superior aspect and that was sent for await as well.  Hemostasis was achieved with electrocautery.  The pocket was irrigated with antibiotic solution.  The two vertical limbs were brought together and brought down to the horizontal limb, 10 cm from the medial most portion of the incision sites.  The 3-0 Vicryl were used to reapproximate the skin. The deep layers, followed by 4-0 Vicryl.  The patient was sat up and marked for her location of the nipple areola complex.  The 42 cookie cutter was used initially when the limb was de-epithelialized.  The 38 mm cookie cutter was selected and placed to match the other side, which was 6 cm from the inframammary fold.  The skin was de-epithelialized under the area of the cookie cutter, and the nipple areola complex was brought up and tacked to the edge with 5-0 Vicryl.  5-0 Monocryl was used to reapproximate all the skin edges with a running subcuticular stitch.  No drain was placed. Dermabond was then applied with ABDs and breast binder.  The patient tolerated the procedure well.  There were no complications.  A total of 582 g were removed.  The patient was awoken and tolerated the procedure well.  Wayland Denis, DO     CS/MEDQ  D:  04/01/2012  T:  04/02/2012  Job:  [id number]

## 2012-04-07 ENCOUNTER — Encounter (HOSPITAL_BASED_OUTPATIENT_CLINIC_OR_DEPARTMENT_OTHER): Payer: Self-pay

## 2012-05-15 ENCOUNTER — Other Ambulatory Visit: Payer: Self-pay | Admitting: Obstetrics and Gynecology

## 2012-05-15 DIAGNOSIS — Z1231 Encounter for screening mammogram for malignant neoplasm of breast: Secondary | ICD-10-CM

## 2012-06-10 ENCOUNTER — Ambulatory Visit (INDEPENDENT_AMBULATORY_CARE_PROVIDER_SITE_OTHER): Payer: Medicare Other | Admitting: Obstetrics and Gynecology

## 2012-06-10 ENCOUNTER — Encounter: Payer: Self-pay | Admitting: Obstetrics and Gynecology

## 2012-06-10 VITALS — BP 120/60 | Ht 61.0 in | Wt 182.0 lb

## 2012-06-10 DIAGNOSIS — Z124 Encounter for screening for malignant neoplasm of cervix: Secondary | ICD-10-CM

## 2012-06-10 DIAGNOSIS — F32A Depression, unspecified: Secondary | ICD-10-CM

## 2012-06-10 DIAGNOSIS — F419 Anxiety disorder, unspecified: Secondary | ICD-10-CM

## 2012-06-10 DIAGNOSIS — F411 Generalized anxiety disorder: Secondary | ICD-10-CM

## 2012-06-10 DIAGNOSIS — N952 Postmenopausal atrophic vaginitis: Secondary | ICD-10-CM

## 2012-06-10 DIAGNOSIS — F329 Major depressive disorder, single episode, unspecified: Secondary | ICD-10-CM

## 2012-06-10 DIAGNOSIS — Z01419 Encounter for gynecological examination (general) (routine) without abnormal findings: Secondary | ICD-10-CM

## 2012-06-10 MED ORDER — AMBULATORY NON FORMULARY MEDICATION: Status: DC

## 2012-06-10 MED ORDER — ESTRADIOL 10 MCG VA TABS: 10.0000 ug | ORAL_TABLET | VAGINAL | Status: DC

## 2012-06-10 NOTE — Progress Notes (Deleted)
Regular Periods: {yes no:314532} Mammogram: {YES NO:22349}  Monthly Breast Ex.: {yes RU:045409} Exercise: {yes no:314532}  Tetanus < 10 years: {yes no:314532} Seatbelts: {yes no:314532}  NI. Bladder Functn.: {yes no:314532} Abuse at home: {yes no:314532}  Daily BM's: {yes no:314532} Stressful Work: {yes WJ:191478}  Healthy Diet: {yes no:314532} Sigmoid-Colonoscopy: ***  Calcium: {yes no:314532} Medical problems this year: ***   LAST PAP:11/2008  Contraception: ***  Mammogram:  ***  PCP: ***  PMH: ***  FMH: ***  Last Bone Scan: ***

## 2012-06-10 NOTE — Progress Notes (Signed)
Subjective:    Elaine Simon is a 61 y.o. female No obstetric history on file. who presents for annual exam. The patient has multiple complaints. She feels that her life has no meaning. She complains of vaginal dryness.  She is not sexually active.  She had a breast reduction on the right and is not pleased with the results.  She complains of "tingling" on the bottom of her feet. She wants to have a TB test done.  She has a history of bronchitis.  She has a history of osteopenia.  She has had breast cancer on the left and she is status post radiation.  She thinks that she is depressed.  She has a history of anxiety.   The following portions of the patient's history were reviewed and updated as appropriate: allergies, current medications, past family history, past medical history, past social history, past surgical history and problem list.  Review of Systems Pertinent items are noted in HPI. Gastrointestinal:No change in bowel habits, no abdominal pain, no rectal bleeding Genitourinary:negative for dysuria, frequency, hematuria, nocturia and urinary incontinence    Objective:     BP 120/60  Ht 5\' 1"  (1.549 m)  Wt 182 lb (82.555 kg)  BMI 34.39 kg/m2  Weight:  Wt Readings from Last 1 Encounters:  06/10/12 182 lb (82.555 kg)     BMI: Body mass index is 34.39 kg/(m^2). General Appearance: Alert, appropriate appearance for age. No acute distress HEENT: Grossly normal Neck / Thyroid: Supple, no masses, nodes or enlargement Lungs: clear to auscultation bilaterally Back: No CVA tenderness Breast Exam: healed scars on the right from reduction mammoplasty.  Thickening on the left from radiation. Cardiovascular: Regular rate and rhythm. S1, S2, no murmur Gastrointestinal: Soft, non-tender, no masses or organomegaly  ++++++++++++++++++++++++++++++++++++++++++++++++++++++++  Pelvic Exam: External genitalia: normal general appearance Vaginal: normal without tenderness, induration or  masses, atrophic mucosa and relaxation noted Cervix: flush with the vaginal apex Adnexa: normal bimanual exam Uterus: normal size, nontender Rectovaginal: normal rectal, no masses  ++++++++++++++++++++++++++++++++++++++++++++++++++++++++  Lymphatic Exam: Non-palpable nodes in neck, clavicular, axillary, or inguinal regions  Psychiatric: Alert and oriented, appropriate affect.@OBJECTIVEEN @      Assessment:    history of anxiety and panic disorder.  Patient with multiple complaints today   Depression  History of breast cancer.  No evidence of recurrence. . Vaginal atrophy  Overweight or obese: Yes  Pelvic relaxation: Yes  Menopausal symptoms: Yes. Severe: Yes.  Osteopenia  Recurrent bacterial vaginosis   Plan:    Pap smear.   Patient is referred back to her family physician for management of her multiple complaints.  Today's visit was still 45 min. (greater than 60%the face).  Bone density scan.  Follow-up:  for annual exam  The updated Pap smear screening guidelines were discussed with the patient. The patient requested that I obtain a Pap smear: Yes.  Kegel exercises discussed: Yes.  Proper diet and regular exercise were reviewed.  Annual mammograms recommended starting at age 50. Proper breast care was discussed.  Screening colonoscopy is recommended beginning at age 37.  Regular health maintenance was reviewed.  Sleep hygiene was discussed.  Adequate calcium and vitamin D intake was emphasized.  Vagifem 10 g per vagina twice each week.  Boric acid vaginal suppositories 600 mg twice each week.  Leonard Schwartz M.D.  . Regular Periods:Post Menopausal Mammogram: no  Monthly Breast Ex.: no Exercise: no  Tetanus < 10 years: no Seatbelts: yes  NI. Bladder Functn.: yes Abuse at home:  no  Daily BM's: yes Stressful Work: yes  Healthy Diet: no Sigmoid-Colonoscopy: yes  Calcium: no Medical problems this year: Bronchitis   LAST PAP:11/23/2008  WNL  Contraception: Postmenopausal  Mammogram: 01/2011 WNL  PCP: Arcelia Jew  PMH: None  FMH: None  Last Bone Scan: None

## 2012-06-11 LAB — PAP IG W/ RFLX HPV ASCU

## 2012-06-18 ENCOUNTER — Telehealth: Payer: Self-pay | Admitting: Obstetrics and Gynecology

## 2012-06-18 DIAGNOSIS — M810 Age-related osteoporosis without current pathological fracture: Secondary | ICD-10-CM

## 2012-06-18 NOTE — Telephone Encounter (Signed)
Tc to pt per telephone call. Pt request pap smear results. Told pt pap smear-wnl. Pt needs DEXA sched. DEXA sched 06/23/12 @10 :30a. Pt agrees.

## 2012-06-23 ENCOUNTER — Other Ambulatory Visit: Payer: Medicare Other

## 2012-07-06 ENCOUNTER — Other Ambulatory Visit: Payer: Self-pay

## 2012-07-06 DIAGNOSIS — M8580 Other specified disorders of bone density and structure, unspecified site: Secondary | ICD-10-CM

## 2012-07-07 ENCOUNTER — Other Ambulatory Visit: Payer: Medicare Other

## 2012-07-08 ENCOUNTER — Other Ambulatory Visit: Payer: Medicare Other

## 2012-07-21 ENCOUNTER — Other Ambulatory Visit (INDEPENDENT_AMBULATORY_CARE_PROVIDER_SITE_OTHER): Payer: Medicare Other

## 2012-07-21 DIAGNOSIS — M810 Age-related osteoporosis without current pathological fracture: Secondary | ICD-10-CM

## 2012-07-30 ENCOUNTER — Telehealth: Payer: Self-pay

## 2012-07-30 NOTE — Telephone Encounter (Signed)
appt scheduled for 11-21/13 @ 3:30pm to discuss osteoporosis.  Pt will check her schedule to be sure she can come in that day and will call the office back Pt asked if AVS could call her and discuss her results because she may have surgery coming up That will cause her not to come in the office.  [facility] CMA

## 2012-08-06 ENCOUNTER — Ambulatory Visit (INDEPENDENT_AMBULATORY_CARE_PROVIDER_SITE_OTHER): Payer: Medicare Other | Admitting: Obstetrics and Gynecology

## 2012-08-06 ENCOUNTER — Encounter: Payer: Self-pay | Admitting: Obstetrics and Gynecology

## 2012-08-06 VITALS — BP 126/80 | Ht 62.0 in | Wt 187.0 lb

## 2012-08-06 DIAGNOSIS — C50919 Malignant neoplasm of unspecified site of unspecified female breast: Secondary | ICD-10-CM

## 2012-08-06 DIAGNOSIS — F419 Anxiety disorder, unspecified: Secondary | ICD-10-CM

## 2012-08-06 DIAGNOSIS — M810 Age-related osteoporosis without current pathological fracture: Secondary | ICD-10-CM

## 2012-08-06 DIAGNOSIS — F411 Generalized anxiety disorder: Secondary | ICD-10-CM

## 2012-08-06 DIAGNOSIS — N952 Postmenopausal atrophic vaginitis: Secondary | ICD-10-CM

## 2012-08-06 DIAGNOSIS — R6882 Decreased libido: Secondary | ICD-10-CM

## 2012-08-07 NOTE — Progress Notes (Signed)
HISTORY OF PRESENT ILLNESS  Elaine Simon is a 61 y.o. year old female,No obstetric history on file., who presents for a problem visit. The patient has a history of breast cancer dating back to 26.  She is currently without evidence of disease.  She has a past history of osteoporosis and reports that in the past she was on Fosamax for short period of time.  She has also taken Evista in the past.she has a history of anxiety and depression.  She has a history of atrophic vaginitis and decreased libido.  Subjective:  The patient did not use her Vagifem as previously prescribed.  She says it is too expensive.  Again, she complains of decreased libido and decreased sexual response.  Objective:  BP 126/80  Ht 5\' 2"  (1.575 m)  Wt 187 lb (84.823 kg)  BMI 34.20 kg/m2   General: no distress  Exam deferred.  Bone density scan:T score of -3.3 in the spine which is consistent with osteoporosis.  Assessment:  Osteoporosis  Decreased libido and decreased sexual response  Atrophic vaginitis  Breast cancer  Anxiety and depression  Plan:  Treatment options for osteoporosis were reviewed. Risks and benefits were outlined.  Questions were answered.  I will try to obtain her prior bone density scan from several years ago and compare the results.  I believe the patient should be treated with Fosamax 70 mg each week.  The patient will be called with my findings.  Treatment for decreased libido, decreased sexual response, and atrophic vaginitis were reviewed.  For now the patient wishes to try olive oil and/or Coconut oil for vaginal lubrication.  She will not use the Vagifem for the moment. In the past we have discussed the potential risk of using vaginal estrogen and recurrence of breast cancer.  25 min. Office encounter (greater than 50% face to face).   Return to office as needed.   Leonard Schwartz M.D.  08/06/12

## 2012-11-11 ENCOUNTER — Telehealth: Payer: Self-pay | Admitting: Oncology

## 2012-11-11 ENCOUNTER — Telehealth: Payer: Self-pay | Admitting: Medical Oncology

## 2012-11-11 NOTE — Telephone Encounter (Signed)
S/w pt re appt 2/28

## 2012-11-11 NOTE — Telephone Encounter (Signed)
Patient Elaine Simon stating having pain to right breast "discomfort increasing" and "causing me anxiety" requesting appt with Dr Welton Flakes. Per MD, pt to be sched 02/28 @ 1015, onc treatment sent. Scheduling to notify pt.

## 2012-11-13 ENCOUNTER — Ambulatory Visit (HOSPITAL_BASED_OUTPATIENT_CLINIC_OR_DEPARTMENT_OTHER): Payer: Medicare Other | Admitting: Oncology

## 2012-11-13 ENCOUNTER — Encounter: Payer: Self-pay | Admitting: Oncology

## 2012-11-13 ENCOUNTER — Telehealth: Payer: Self-pay | Admitting: Oncology

## 2012-11-13 VITALS — BP 117/78 | HR 76 | Temp 98.5°F | Resp 20 | Ht 62.0 in | Wt 187.8 lb

## 2012-11-13 DIAGNOSIS — M810 Age-related osteoporosis without current pathological fracture: Secondary | ICD-10-CM

## 2012-11-13 DIAGNOSIS — Z1231 Encounter for screening mammogram for malignant neoplasm of breast: Secondary | ICD-10-CM

## 2012-11-13 DIAGNOSIS — Z853 Personal history of malignant neoplasm of breast: Secondary | ICD-10-CM

## 2012-11-13 NOTE — Patient Instructions (Addendum)
Proceed with mammograms  I will see you back in May 2014

## 2012-11-22 NOTE — Progress Notes (Signed)
OFFICE PROGRESS NOTE  CC  Elaine Parker, MD [address] [place] [address]  DIAGNOSIS: 62 year old female with history of left breast cancer originally diagnosed in 1995 patient is a 62 year breast cancer survivor  PRIOR THERAPY: #1  oncologic history dating back to 1995 when she on self breast examination found a left breast lump. At that time she was in [facility]. She went on to have a mammogram and a biopsy. In [place] patient tells me that they recommended that she have a mastectomy. However patient sought out a second opinion at [facility]. Where she was recommended lumpectomy with axillary lymph node dissection.She had this performed in 1995. She tells me that she was found to have a stage I cancer. 19 lymph nodes were negative for metastatic disease. Apparently it was estrogen receptor positive. Post lumpectomy patient went on to receive what sounds like adjuvant chemotherapy. She cannot recall the medications but from her description it seems that she may have received Adriamycin and Cytoxan for a total of 4-6 cycles. Once she completed bad she tells me that she went on to receive radiation therapy to the left breast. She was then recommended tamoxifen and patient declined. However several years ago she started taking Evista for osteoporosis and she tells me that she has been told by her previous oncologist and physicians that Evista will be helping her breast cancer risk reduction as well. Patient has been on annual visits with me.  CURRENT THERAPY:Evista  INTERVAL HISTORY: Elaine Simon 62 y.o. female returns for urgent visit.she recently noted increasing discomfort in her right breast causing are a lot of anxiety. Because of this she wanted to an appointment with me. I saw her today. She is having some discomfort in the right breast but there is no palpable masses. She is due for a mammogram soon. I do think that we should have  this done now to make sure that she does not have any evidence of recurrent disease. She otherwise is without any complaints  MEDICAL HISTORY: Past Medical History  Diagnosis Date  . Cataract immature     bilat.  . Hypothyroidism   . Hay fever   . Osteoarthritis   . Anxiety   . Insomnia   . Hyperlipidemia   . Breast cancer 1995  . Panic attacks   . Hx of mitral valve prolapse     has had no problems  . Snores     states occ. apnea, occ. wakes up coughing; has never had sleep study;  denies daytime sleepiness  . Depression     ALLERGIES:  is allergic to adhesive and codeine.  MEDICATIONS:  Current Outpatient Prescriptions  Medication Sig Dispense Refill  . ALPRAZolam (XANAX) 1 MG tablet Take 1 mg by mouth as needed.      . AMBULATORY NON FORMULARY MEDICATION Medication Name: Boric Acid  Insert 1 Capsule into the vagina twice each week.  24 capsule  12  . amoxicillin-clavulanate (AUGMENTIN) 500-125 MG per tablet       . calcium carbonate (OS-CAL) 600 MG TABS Take 600 mg by mouth 2 (two) times daily with a meal.      . cholecalciferol (VITAMIN D) 1000 UNITS tablet Take 1,000 Units by mouth daily.      . fish oil-omega-3 fatty acids 1000 MG capsule Take 2 g by mouth daily.      Marland Kitchen olmesartan (BENICAR) 20 MG tablet Take 20 mg by mouth daily.      Marland Kitchen  raloxifene (EVISTA) 60 MG tablet Take 60 mg by mouth daily. PM      . traZODone (DESYREL) 50 MG tablet Take 50 mg by mouth at bedtime.      . Estradiol (VAGIFEM) 10 MCG TABS Place 1 tablet (10 mcg total) vaginally 2 (two) times a week.  8 tablet  11  . fenofibrate (TRICOR) 145 MG tablet Take 145 mg by mouth daily.      Marland Kitchen levothyroxine (SYNTHROID, LEVOTHROID) 50 MCG tablet Take 50 mcg by mouth daily.      . pravastatin (PRAVACHOL) 40 MG tablet Take 40 mg by mouth daily. PM       No current facility-administered medications for this visit.    SURGICAL HISTORY:  Past Surgical History  Procedure Laterality Date  . Breast lumpectomy   1995    left  . Ganglion cyst excision    . Tonsillectomy    . Uterine fibroid surgery    . Breast reconstruction  04/01/2012    Procedure: BREAST RECONSTRUCTION;  Surgeon: Wayland Denis, DO;  Location: [facility];  Service: Plastics;  Laterality: Right;  Right breast mastopexy reduction     REVIEW OF SYSTEMS:  Pertinent items are noted in HPI.   HEALTH MAINTENANCE:  PHYSICAL EXAMINATION: Blood pressure 117/78, pulse 76, temperature 98.5 F (36.9 C), temperature source Oral, resp. rate 20, height 5\' 2"  (1.575 m), weight 187 lb 12.8 oz (85.186 kg). Body mass index is 34.34 kg/(m^2). ECOG PERFORMANCE STATUS: 0 - Asymptomatic   Resp: clear to auscultation bilaterally Cardio: regular rate and rhythm GI: soft, non-tender; bowel sounds normal; no masses,  no organomegaly Extremities: extremities normal, atraumatic, no cyanosis or edema Neurologic: Grossly normal Bilateral breast examination is performed. Left breast no masses or nipple discharge right breast without any palpable masses.  LABORATORY DATA: Lab Results  Component Value Date   WBC 5.0 01/09/2012   HGB 12.4 04/01/2012   HCT 38.6 01/09/2012   MCV 90.6 01/09/2012   PLT 322 01/09/2012      Chemistry      Component Value Date/Time   NA 138 01/09/2012 1500   K 4.0 01/09/2012 1500   CL 104 01/09/2012 1500   CO2 27 01/09/2012 1500   BUN 11 01/09/2012 1500   CREATININE 1.06 01/09/2012 1500      Component Value Date/Time   CALCIUM 9.9 01/09/2012 1500   ALKPHOS 58 01/09/2012 1500   AST 16 01/09/2012 1500   ALT 13 01/09/2012 1500   BILITOT 0.3 01/09/2012 1500       RADIOGRAPHIC STUDIES:  No results found.  ASSESSMENT: 62 year old female with  #1 history of right breast cancer stage I diagnosed over 18 years ago. Now presenting with increasing right breast discomfort. Patient has been her possibility of recurrent disease. Recommendation is to proceed with mammograms diagnostic. If an ultrasound needs to be done it  will be performed. Patient is reassured.   PLAN:   #1Proceed with mammogram possibly ultrasound.  #2 I will see her back in followup   All questions were answered. The patient knows to call the clinic with any problems, questions or concerns. We can certainly see the patient much sooner if necessary.  I spent 25 minutes counseling the patient face to face. The total time spent in the appointment was 30 minutes.  Drue Second, MD Medical/Oncology [facility] [phone] (beeper) [phone] (Office)

## 2012-12-17 ENCOUNTER — Ambulatory Visit
Admission: RE | Admit: 2012-12-17 | Discharge: 2012-12-17 | Disposition: A | Discharge status: Home / Self Care | Payer: Medicare Other | Source: Ambulatory Visit | Attending: Oncology | Admitting: Oncology

## 2012-12-17 DIAGNOSIS — Z1231 Encounter for screening mammogram for malignant neoplasm of breast: Secondary | ICD-10-CM

## 2012-12-28 ENCOUNTER — Other Ambulatory Visit: Payer: Self-pay | Admitting: Oncology

## 2012-12-28 ENCOUNTER — Ambulatory Visit
Admission: RE | Admit: 2012-12-28 | Discharge: 2012-12-28 | Disposition: A | Discharge status: Home / Self Care | Payer: Medicare Other | Source: Ambulatory Visit | Attending: Oncology | Admitting: Oncology

## 2012-12-28 DIAGNOSIS — R928 Other abnormal and inconclusive findings on diagnostic imaging of breast: Secondary | ICD-10-CM

## 2013-01-11 ENCOUNTER — Other Ambulatory Visit: Payer: Medicare Other | Admitting: Lab

## 2013-01-11 ENCOUNTER — Ambulatory Visit: Payer: Medicare Other | Admitting: Oncology

## 2013-02-10 ENCOUNTER — Ambulatory Visit: Payer: Self-pay | Admitting: Oncology

## 2013-02-10 ENCOUNTER — Telehealth: Payer: Self-pay | Admitting: Oncology

## 2013-02-10 ENCOUNTER — Other Ambulatory Visit: Payer: Self-pay | Admitting: Lab

## 2013-02-10 NOTE — Telephone Encounter (Signed)
, °

## 2013-02-15 ENCOUNTER — Encounter: Payer: Self-pay | Admitting: Oncology

## 2013-02-15 ENCOUNTER — Telehealth: Payer: Self-pay | Admitting: Oncology

## 2013-02-15 ENCOUNTER — Ambulatory Visit (HOSPITAL_BASED_OUTPATIENT_CLINIC_OR_DEPARTMENT_OTHER): Payer: Medicare Other | Admitting: Oncology

## 2013-02-15 ENCOUNTER — Other Ambulatory Visit (HOSPITAL_BASED_OUTPATIENT_CLINIC_OR_DEPARTMENT_OTHER): Payer: Medicare Other | Admitting: Lab

## 2013-02-15 VITALS — BP 121/81 | HR 82 | Temp 98.9°F | Resp 20 | Ht 62.0 in | Wt 191.1 lb

## 2013-02-15 DIAGNOSIS — E559 Vitamin D deficiency, unspecified: Secondary | ICD-10-CM

## 2013-02-15 DIAGNOSIS — Z853 Personal history of malignant neoplasm of breast: Secondary | ICD-10-CM

## 2013-02-15 LAB — COMPREHENSIVE METABOLIC PANEL (CC13)
ALT: 14 U/L (ref 0–55)
AST: 18 U/L (ref 5–34)
Albumin: 4 g/dL (ref 3.5–5.0)
Alkaline Phosphatase: 56 U/L (ref 40–150)
BUN: 12.7 mg/dL (ref 7.0–26.0)
CO2: 24 mEq/L (ref 22–29)
Calcium: 9.3 mg/dL (ref 8.4–10.4)
Chloride: 102 mEq/L (ref 98–107)
Creatinine: 1 mg/dL (ref 0.6–1.1)
Glucose: 112 mg/dl — ABNORMAL HIGH (ref 70–99)
Potassium: 3.8 mEq/L (ref 3.5–5.1)
Sodium: 136 mEq/L (ref 136–145)
Total Bilirubin: 0.52 mg/dL (ref 0.20–1.20)
Total Protein: 7.7 g/dL (ref 6.4–8.3)

## 2013-02-15 LAB — CBC WITH DIFFERENTIAL/PLATELET
BASO%: 0.4 % (ref 0.0–2.0)
Basophils Absolute: 0 10*3/uL (ref 0.0–0.1)
EOS%: 0.2 % (ref 0.0–7.0)
Eosinophils Absolute: 0 10*3/uL (ref 0.0–0.5)
HCT: 39.7 % (ref 34.8–46.6)
HGB: 13.2 g/dL (ref 11.6–15.9)
LYMPH%: 29 % (ref 14.0–49.7)
MCH: 29.8 pg (ref 25.1–34.0)
MCHC: 33.3 g/dL (ref 31.5–36.0)
MCV: 89.5 fL (ref 79.5–101.0)
MONO#: 0.3 10*3/uL (ref 0.1–0.9)
MONO%: 4.8 % (ref 0.0–14.0)
NEUT#: 4.3 10*3/uL (ref 1.5–6.5)
NEUT%: 65.6 % (ref 38.4–76.8)
Platelets: 290 10*3/uL (ref 145–400)
RBC: 4.44 10*6/uL (ref 3.70–5.45)
RDW: 13.5 % (ref 11.2–14.5)
WBC: 6.6 10*3/uL (ref 3.9–10.3)
lymph#: 1.9 10*3/uL (ref 0.9–3.3)

## 2013-02-15 NOTE — Telephone Encounter (Signed)
m °

## 2013-02-16 LAB — VITAMIN D 25 HYDROXY (VIT D DEFICIENCY, FRACTURES): Vit D, 25-Hydroxy: 47 ng/mL (ref 30–89)

## 2013-02-16 NOTE — Progress Notes (Signed)
Quick Note:  Call patient: take vitamin D 1000 u daily ______

## 2013-02-17 ENCOUNTER — Ambulatory Visit: Payer: Self-pay | Admitting: Oncology

## 2013-02-17 ENCOUNTER — Telehealth: Payer: Self-pay | Admitting: Medical Oncology

## 2013-02-17 ENCOUNTER — Other Ambulatory Visit: Payer: Self-pay | Admitting: Lab

## 2013-02-17 NOTE — Telephone Encounter (Signed)
May send lab results to her

## 2013-02-17 NOTE — Telephone Encounter (Signed)
Per MD, informed patient of her labs and to continue with taking vitamin D 1000 iu daily. Patient states she does take it just not everyday but will try. Patient also requested copy of labs to be mailed to her. All questions answered, patient knows to call office with any concerns or questions. Patient also expressed thanks for being patient with her and answering all her questions and wished for me to express to Dr Welton Flakes how much she appreciates her compassion and care.  Mssg sent to MD.

## 2013-02-17 NOTE — Telephone Encounter (Signed)
Message copied by Rexene Edison on Wed Feb 17, 2013  3:07 PM ------      Message from: Elaine Simon      Created: Tue Feb 16, 2013  8:39 PM       Call patient: take vitamin D 1000 u daily ------

## 2013-03-08 NOTE — Progress Notes (Signed)
OFFICE PROGRESS NOTE  CC  Elaine Parker, MD [address] [place] [address]  DIAGNOSIS: 62 year old female with history of left breast cancer originally diagnosed in 1995 patient is a 18 year breast cancer survivor  PRIOR THERAPY: #1  oncologic history dating back to 1995 when she on self breast examination found a left breast lump. At that time she was in [facility]. She went on to have a mammogram and a biopsy. In [place] patient tells me that they recommended that she have a mastectomy. However patient sought out a second opinion at [facility]. Where she was recommended lumpectomy with axillary lymph node dissection.She had this performed in 1995. She tells me that she was found to have a stage I cancer. 19 lymph nodes were negative for metastatic disease. Apparently it was estrogen receptor positive. Post lumpectomy patient went on to receive what sounds like adjuvant chemotherapy. She cannot recall the medications but from her description it seems that she may have received Adriamycin and Cytoxan for a total of 4-6 cycles. Once she completed bad she tells me that she went on to receive radiation therapy to the left breast. She was then recommended tamoxifen and patient declined. However several years ago she started taking Evista for osteoporosis and she tells me that she has been told by her previous oncologist and physicians that Evista will be helping her breast cancer risk reduction as well. Patient has been on annual visits with me.  CURRENT THERAPY: Evista  INTERVAL HISTORY: Elaine Simon 62 y.o. female returns for to discuss her mammogram results. She artery. And she feels much better but she has significant anxiety. Patient also has had some difficulty with intimacy and she is on adjuvant and 10 mcg tablets 2 times weekly. We discussed this as well. She is also on Zoloft and trazodone to help her with the anxiety. She remains on  Evista 60 mg daily and is tolerating it quite nicely. She understands that this would be a chemopreventive sort of drug to help prevent future breast cancer. She has no nausea or vomiting. She still continues to have a little bit of breast tenderness. Remainder of the 10 point review of systems is negative  MEDICAL HISTORY: Past Medical History  Diagnosis Date  . Cataract immature     bilat.  . Hypothyroidism   . Hay fever   . Osteoarthritis   . Anxiety   . Insomnia   . Hyperlipidemia   . Breast cancer 1995  . Panic attacks   . Hx of mitral valve prolapse     has had no problems  . Snores     states occ. apnea, occ. wakes up coughing; has never had sleep study;  denies daytime sleepiness  . Depression     ALLERGIES:  is allergic to adhesive and codeine.  MEDICATIONS:  Current Outpatient Prescriptions  Medication Sig Dispense Refill  . ALPRAZolam (XANAX) 1 MG tablet Take 1 mg by mouth as needed.      . AMBULATORY NON FORMULARY MEDICATION Medication Name: Boric Acid  Insert 1 Capsule into the vagina twice each week.  24 capsule  12  . calcium carbonate (OS-CAL) 600 MG TABS Take 600 mg by mouth 2 (two) times daily with a meal.      . cholecalciferol (VITAMIN D) 1000 UNITS tablet Take 1,000 Units by mouth daily.      . fish oil-omega-3 fatty acids 1000 MG capsule Take 2 g by mouth daily.      Marland Kitchen  hydrochlorothiazide (MICROZIDE) 12.5 MG capsule       . pravastatin (PRAVACHOL) 40 MG tablet Take 40 mg by mouth daily. PM      . raloxifene (EVISTA) 60 MG tablet Take 60 mg by mouth daily. PM      . traZODone (DESYREL) 50 MG tablet Take 50 mg by mouth at bedtime.      Marland Kitchen amoxicillin-clavulanate (AUGMENTIN) 500-125 MG per tablet       . Estradiol (VAGIFEM) 10 MCG TABS Place 1 tablet (10 mcg total) vaginally 2 (two) times a week.  8 tablet  11  . etodolac (LODINE) 400 MG tablet       . fenofibrate (TRICOR) 145 MG tablet Take 145 mg by mouth daily.      Marland Kitchen levothyroxine (SYNTHROID,  LEVOTHROID) 50 MCG tablet Take 50 mcg by mouth daily.      Marland Kitchen olmesartan (BENICAR) 20 MG tablet Take 20 mg by mouth daily.      . sertraline (ZOLOFT) 50 MG tablet        No current facility-administered medications for this visit.    SURGICAL HISTORY:  Past Surgical History  Procedure Laterality Date  . Breast lumpectomy  1995    left  . Ganglion cyst excision    . Tonsillectomy    . Uterine fibroid surgery    . Breast reconstruction  04/01/2012    Procedure: BREAST RECONSTRUCTION;  Surgeon: Wayland Denis, DO;  Location: [facility];  Service: Plastics;  Laterality: Right;  Right breast mastopexy reduction     REVIEW OF SYSTEMS:  Pertinent items are noted in HPI.   HEALTH MAINTENANCE:  PHYSICAL EXAMINATION: Blood pressure 121/81, pulse 82, temperature 98.9 F (37.2 C), temperature source Oral, resp. rate 20, height 5\' 2"  (1.575 m), weight 191 lb 1.6 oz (86.682 kg). Body mass index is 34.94 kg/(m^2). ECOG PERFORMANCE STATUS: 0 - Asymptomatic   Resp: clear to auscultation bilaterally Cardio: regular rate and rhythm GI: soft, non-tender; bowel sounds normal; no masses,  no organomegaly Extremities: extremities normal, atraumatic, no cyanosis or edema Neurologic: Grossly normal Bilateral breast examination is performed. Left breast no masses or nipple discharge right breast without any palpable masses.  LABORATORY DATA: Lab Results  Component Value Date   WBC 6.6 02/15/2013   HGB 13.2 02/15/2013   HCT 39.7 02/15/2013   MCV 89.5 02/15/2013   PLT 290 02/15/2013      Chemistry      Component Value Date/Time   NA 136 02/15/2013 1101   NA 138 01/09/2012 1500   K 3.8 02/15/2013 1101   K 4.0 01/09/2012 1500   CL 102 02/15/2013 1101   CL 104 01/09/2012 1500   CO2 24 02/15/2013 1101   CO2 27 01/09/2012 1500   BUN 12.7 02/15/2013 1101   BUN 11 01/09/2012 1500   CREATININE 1.0 02/15/2013 1101   CREATININE 1.06 01/09/2012 1500      Component Value Date/Time   CALCIUM 9.3 02/15/2013 1101    CALCIUM 9.9 01/09/2012 1500   ALKPHOS 56 02/15/2013 1101   ALKPHOS 58 01/09/2012 1500   AST 18 02/15/2013 1101   AST 16 01/09/2012 1500   ALT 14 02/15/2013 1101   ALT 13 01/09/2012 1500   BILITOT 0.52 02/15/2013 1101   BILITOT 0.3 01/09/2012 1500       RADIOGRAPHIC STUDIES:  No results found.  ASSESSMENT: 62 year old female with  #1 history of right breast cancer stage I diagnosed over 18 years ago.a few months ago the  patient had developed right breasts tenderness she is very concerned about recurrence. We did a mammogram that was negative for any evidence of metastatic disease. I have discussed the findings with the patient. I have reassured her.  #2 We had an extensive discussion regarding trying to keep her from thinking that her cancer is going to be coming back. I do think that the patient is under a lot of stress.I did suggest to her regarding the possibility of sending her for counseling to reduce her stress. But she is already being seen by someone. She has a lot of personal issues that need to be worked out.   PLAN:  #1 I will plan on seeing the patient back in about 1 years time.  #2 she knows to call me with any problems   All questions were answered. The patient knows to call the clinic with any problems, questions or concerns. We can certainly see the patient much sooner if necessary.  I spent 25 minutes counseling the patient face to face. The total time spent in the appointment was 30 minutes.  Drue Second, MD Medical/Oncology [facility] [phone] (beeper) [phone] (Office)

## 2013-04-23 DIAGNOSIS — N6489 Other specified disorders of breast: Secondary | ICD-10-CM | POA: Insufficient documentation

## 2013-05-21 ENCOUNTER — Other Ambulatory Visit: Payer: Self-pay | Admitting: Oncology

## 2013-05-21 DIAGNOSIS — N641 Fat necrosis of breast: Secondary | ICD-10-CM

## 2013-06-02 ENCOUNTER — Ambulatory Visit
Admission: RE | Admit: 2013-06-02 | Discharge: 2013-06-02 | Disposition: A | Discharge status: Home / Self Care | Payer: Self-pay | Source: Ambulatory Visit | Attending: Oncology | Admitting: Oncology

## 2013-06-02 ENCOUNTER — Other Ambulatory Visit: Payer: Medicare Other

## 2013-06-02 DIAGNOSIS — N641 Fat necrosis of breast: Secondary | ICD-10-CM

## 2013-08-19 ENCOUNTER — Other Ambulatory Visit: Payer: Self-pay | Admitting: Internal Medicine

## 2013-08-19 DIAGNOSIS — E049 Nontoxic goiter, unspecified: Secondary | ICD-10-CM

## 2013-08-24 ENCOUNTER — Ambulatory Visit (HOSPITAL_COMMUNITY): Payer: Medicare Other

## 2013-10-25 ENCOUNTER — Other Ambulatory Visit: Payer: Self-pay | Admitting: Internal Medicine

## 2013-10-25 DIAGNOSIS — M810 Age-related osteoporosis without current pathological fracture: Secondary | ICD-10-CM

## 2013-12-07 ENCOUNTER — Other Ambulatory Visit: Payer: Self-pay | Admitting: Oncology

## 2013-12-07 ENCOUNTER — Other Ambulatory Visit: Payer: Self-pay

## 2013-12-07 DIAGNOSIS — Z1231 Encounter for screening mammogram for malignant neoplasm of breast: Secondary | ICD-10-CM

## 2013-12-21 ENCOUNTER — Ambulatory Visit
Admission: RE | Admit: 2013-12-21 | Discharge: 2013-12-21 | Disposition: A | Discharge status: Home / Self Care | Payer: Medicare Other | Source: Ambulatory Visit

## 2013-12-21 DIAGNOSIS — Z1231 Encounter for screening mammogram for malignant neoplasm of breast: Secondary | ICD-10-CM

## 2014-02-01 ENCOUNTER — Telehealth: Payer: Self-pay | Admitting: Adult Health

## 2014-02-01 NOTE — Telephone Encounter (Signed)
, °

## 2014-02-14 ENCOUNTER — Ambulatory Visit: Payer: Medicare Other | Admitting: Oncology

## 2014-02-14 ENCOUNTER — Other Ambulatory Visit: Payer: Medicare Other

## 2014-03-08 ENCOUNTER — Telehealth: Payer: Self-pay | Admitting: Adult Health

## 2014-03-08 NOTE — Telephone Encounter (Signed)
CALLED PT TO INFORM APPT ON 7/1 WITH LC HAS TO BE MOVED. PT STATED SHE DOES NOT WANT TO SEE AN APP FOR HER ANNUAL EXAM. I ADVISED PT I COULD SCHEDULE HER TO SEE DR. Lindi Adie IN LATE AUG BUT NOT SOONER. PT STATES SHE DOESNT WANT HER APPT MOVED FAR FROM WHERE IT IS BECAUSE IT'S BEEN A YEAR, SHE WANTS TO BE SEEN SOONER, AND BY A PHYSICIAN. GAVE PT APPT FOR 7/13 WITH DR. Edwyna Shell. PT ALSO ASKED WHERE DR. KHAN IS NOW PRACTICING. I ADVISED PT I HAVE NO ADDITIONAL INFO RE: DR. Humphrey Rolls. MAILED PT APPT FOR 7/13 AT PT'S REQ.

## 2014-03-16 ENCOUNTER — Other Ambulatory Visit: Payer: Medicare Other

## 2014-03-16 ENCOUNTER — Ambulatory Visit: Payer: Medicare Other | Admitting: Adult Health

## 2014-03-27 ENCOUNTER — Other Ambulatory Visit: Payer: Self-pay | Admitting: Oncology

## 2014-03-27 DIAGNOSIS — Z853 Personal history of malignant neoplasm of breast: Secondary | ICD-10-CM

## 2014-03-28 ENCOUNTER — Ambulatory Visit (HOSPITAL_BASED_OUTPATIENT_CLINIC_OR_DEPARTMENT_OTHER): Payer: Medicare Other | Admitting: Oncology

## 2014-03-28 ENCOUNTER — Encounter: Payer: Self-pay | Admitting: Oncology

## 2014-03-28 ENCOUNTER — Other Ambulatory Visit (HOSPITAL_BASED_OUTPATIENT_CLINIC_OR_DEPARTMENT_OTHER): Payer: Medicare Other

## 2014-03-28 ENCOUNTER — Telehealth: Payer: Self-pay | Admitting: Oncology

## 2014-03-28 VITALS — BP 131/88 | HR 96 | Temp 97.9°F | Resp 18 | Ht 62.0 in | Wt 189.9 lb

## 2014-03-28 DIAGNOSIS — Z853 Personal history of malignant neoplasm of breast: Secondary | ICD-10-CM

## 2014-03-28 DIAGNOSIS — C50912 Malignant neoplasm of unspecified site of left female breast: Secondary | ICD-10-CM

## 2014-03-28 DIAGNOSIS — F341 Dysthymic disorder: Secondary | ICD-10-CM

## 2014-03-28 DIAGNOSIS — Z803 Family history of malignant neoplasm of breast: Secondary | ICD-10-CM

## 2014-03-28 DIAGNOSIS — M810 Age-related osteoporosis without current pathological fracture: Secondary | ICD-10-CM

## 2014-03-28 DIAGNOSIS — E669 Obesity, unspecified: Secondary | ICD-10-CM

## 2014-03-28 LAB — CBC WITH DIFFERENTIAL/PLATELET
BASO%: 0.4 % (ref 0.0–2.0)
Basophils Absolute: 0 10*3/uL (ref 0.0–0.1)
EOS%: 1.3 % (ref 0.0–7.0)
Eosinophils Absolute: 0.1 10*3/uL (ref 0.0–0.5)
HCT: 37.7 % (ref 34.8–46.6)
HGB: 12.5 g/dL (ref 11.6–15.9)
LYMPH%: 36.6 % (ref 14.0–49.7)
MCH: 29.6 pg (ref 25.1–34.0)
MCHC: 33.2 g/dL (ref 31.5–36.0)
MCV: 89.3 fL (ref 79.5–101.0)
MONO#: 0.3 10*3/uL (ref 0.1–0.9)
MONO%: 4.5 % (ref 0.0–14.0)
NEUT#: 3.2 10*3/uL (ref 1.5–6.5)
NEUT%: 57.2 % (ref 38.4–76.8)
Platelets: 269 10*3/uL (ref 145–400)
RBC: 4.22 10*6/uL (ref 3.70–5.45)
RDW: 14.7 % — ABNORMAL HIGH (ref 11.2–14.5)
WBC: 5.5 10*3/uL (ref 3.9–10.3)
lymph#: 2 10*3/uL (ref 0.9–3.3)

## 2014-03-28 LAB — COMPREHENSIVE METABOLIC PANEL (CC13)
ALT: 20 U/L (ref 0–55)
AST: 17 U/L (ref 5–34)
Albumin: 3.9 g/dL (ref 3.5–5.0)
Alkaline Phosphatase: 65 U/L (ref 40–150)
Anion Gap: 11 mEq/L (ref 3–11)
BUN: 14.3 mg/dL (ref 7.0–26.0)
CO2: 23 mEq/L (ref 22–29)
Calcium: 10.2 mg/dL (ref 8.4–10.4)
Chloride: 104 mEq/L (ref 98–109)
Creatinine: 0.9 mg/dL (ref 0.6–1.1)
Glucose: 100 mg/dl (ref 70–140)
Potassium: 4 mEq/L (ref 3.5–5.1)
Sodium: 138 mEq/L (ref 136–145)
Total Bilirubin: 0.25 mg/dL (ref 0.20–1.20)
Total Protein: 7.7 g/dL (ref 6.4–8.3)

## 2014-03-28 NOTE — Telephone Encounter (Signed)
per pof no appt sch prn-per Lenna Sciara

## 2014-03-28 NOTE — Progress Notes (Signed)
OFFICE PROGRESS NOTE   03/28/2014   Physicians:E.Kelby Fam, Art Stringer, Carol Ada, ([place])  INTERVAL HISTORY:  Patient is a very pleasant 63 yo lady with remote history of stage 1 left breast cancer, seen for the first time by this MD for a yearly follow up visit. She was previously followed at this office by Dr Marcy Panning since 2013. Most recent bilateral 3D mammography was at [facility] 12-22-2013, still with heterogeneously dense breast tissue but no mammographic findings of concern. She does breast self exams intermittently, and is seen regularly by PCP and yearly by gyn, with MD exams done. She is not aware of any changes in either breast.  She has never had problems with lymphedema LUE, despite 19 LN removed and radiation. She does not seem to have any residual problems from adjuvant chemotherapy or radiation. There is no known breast cancer in family, including mother and 3 sisters. She has recently been watching diet and increasing exercise, with intentional loss of 15 lbs in last 6 months.   ONCOLOGIC HISTORY Per patient's history, stage 1 left breast cancer found on self exam 20 years ago. She had lumpectomy with 19 axillary nodes removed at [facility], then chemotherapy with ~ 6 cycles of "a red medicine and a pink medicine" in [facility] and radiation in [place]. She declined tamoxifen but did take Evista for low bone density.   Review of systems as above, also: Fairly good energy, good appetite. No recent infectious illness. Left shoulder sore related to positioning when she sleeps. No bleeding. Slight constipation thought medication related, due colonoscopy upcoming. Feels depressed at times and anxious about the cancer at times; she is involved in support group. Remainder of 10 point Review of Systems negative.  Past Medical/ Surgical History As above, also Colonoscopy with polyps initially, none on second exam, due #3 exam by Dr Benson [place] Right breast reduction by  Dr Theodoro Kos  Objective:  Vital signs in last 24 hours:  BP 131/88  Pulse 96  Temp(Src) 97.9 F (36.6 C) (Oral)  Resp 18  Ht 5\' 2"  (1.575 m)  Wt 189 lb 14.4 oz (86.138 kg)  BMI 34.72 kg/m2 Weight 191 in 02-2013 per EMR Alert, oriented and appropriate. Ambulatory without difficulty.   HEENT:PERRL, sclerae not icteric. Oral mucosa moist without lesions, posterior pharynx clear.  Neck supple. No JVD.  Lymphatics:no cervical,suraclavicular, axillary adenopathy Resp: clear to auscultation bilaterally and normal percussion bilaterally Cardio: regular rate and rhythm. No gallop. GI: abdomen obese, soft, nontender, not distended, no mass or organomegaly. Normally active bowel sounds.  Musculoskeletal/ Extremities: without pitting edema, cords, tenderness. No concerns for lymphedema LUE. Neuro: speech fluent and appropriate, CN intact, motor/sensory/cerebellar nonfocal. PSYCH normal mood and affect. Skin without rash, ecchymosis, petechiae Breasts: Left with well healed scar at superior margin of areola, without dominant mass, skin or nipple findings. Right breast with scars from reduction mammoplasty, no dominant mass, no skin or nipple findings. Axillae benign.   Lab Results:  Results for orders placed in visit on 03/28/14  CBC WITH DIFFERENTIAL      Result Value Ref Range   WBC 5.5  3.9 - 10.3 10e3/uL   NEUT# 3.2  1.5 - 6.5 10e3/uL   HGB 12.5  11.6 - 15.9 g/dL   HCT 37.7  34.8 - 46.6 %   Platelets 269  145 - 400 10e3/uL   MCV 89.3  79.5 - 101.0 fL   MCH 29.6  25.1 - 34.0 pg   MCHC 33.2  31.5 - 36.0 g/dL   RBC 4.22  3.70 - 5.45 10e6/uL   RDW 14.7 (*) 11.2 - 14.5 %   lymph# 2.0  0.9 - 3.3 10e3/uL   MONO# 0.3  0.1 - 0.9 10e3/uL   Eosinophils Absolute 0.1  0.0 - 0.5 10e3/uL   Basophils Absolute 0.0  0.0 - 0.1 10e3/uL   NEUT% 57.2  38.4 - 76.8 %   LYMPH% 36.6  14.0 - 49.7 %   MONO% 4.5  0.0 - 14.0 %   EOS% 1.3  0.0 - 7.0 %   BASO% 0.4  0.0 - 2.0 %  COMPREHENSIVE METABOLIC  PANEL (ID78)      Result Value Ref Range   Sodium 138  136 - 145 mEq/L   Potassium 4.0  3.5 - 5.1 mEq/L   Chloride 104  98 - 109 mEq/L   CO2 23  22 - 29 mEq/L   Glucose 100  70 - 140 mg/dl   BUN 14.3  7.0 - 26.0 mg/dL   Creatinine 0.9  0.6 - 1.1 mg/dL   Total Bilirubin 0.25  0.20 - 1.20 mg/dL   Alkaline Phosphatase 65  40 - 150 U/L   AST 17  5 - 34 U/L   ALT 20  0 - 55 U/L   Total Protein 7.7  6.4 - 8.3 g/dL   Albumin 3.9  3.5 - 5.0 g/dL   Calcium 10.2  8.4 - 10.4 mg/dL   Anion Gap 11  3 - 11 mEq/L    Labs as above reviewed with patient  Studies/Results: DIGITAL SCREENING BILATERAL MAMMOGRAM WITH [facility]D TOMO WITH [facility] COMPARISON: Previous exam(s).  ACR Breast Density Category c: The breast tissue is heterogeneously  dense, which may obscure small masses.  FINDINGS:  There are no findings suspicious for malignancy. Images were  processed with CAD. Left lumpectomy and right reduction mammoplasty  changes are reidentified.  IMPRESSION:  No mammographic evidence of malignancy. A result letter of this  screening mammogram will be mailed directly to the patient.  RECOMMENDATION:  Screening mammogram in one year. (Code:SM-B-01Y)  BI-RADS CATEGORY 2: Benign finding(s).  Mammogram report reviewed with patient. Discussed usefulness of 3D/tomo mammography with dense breast tissue   Medications: I have reviewed the patient's current medications. She has not used Evista recently  DISCUSSION: I have congratulated patient on doing so well now out 20 years from early stage breast cancer. I have told her that I do believe that cancer is completely cured. Follow up now is appropriately done by PCP and gyn, including blood counts and chemistries yearly (as she would have with PE yearly regardless) due to past chemotherapy. She should continue 3D mammography yearly. We have discussed benefit from oncology standpoint of weight loss to ideal and I have encouraged her to increase  exercise by 5000 steps daily above baseline. We are glad to see her back at this office at any time if she or other physicians feel that we help, but I have not given her a scheduled return appointment to medical oncology.  Assessment/Plan:  1. Early stage left breast cancer: post lumpectomy and 19 node axillary dissection 1995, adjuvant chemotherapy likely with adriamycin and another agent, local radiation. No known active disease since that treatment completed. She will continue yearly mammograms, physical exam and labs by PCP/ gyn at least yearly, and will follow up prn at this office 2.obesity: BMI 34.8. 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 On calcium and Vit D. Increase weight bearing exercise as above. Has been on Evista 5.anxiety/ depression on zoloft and trazadone.   Patient is pleased with plan for prn follow up with medical oncology. She has had all questions answered to her satisfaction and understands that I will be in touch with her other physicians by this note.  Gordy Levan, MD   03/28/2014, 3:44 PM

## 2014-09-23 ENCOUNTER — Ambulatory Visit
Admission: RE | Admit: 2014-09-23 | Discharge: 2014-09-23 | Disposition: A | Discharge status: Home / Self Care | Payer: Medicare Other | Source: Ambulatory Visit | Attending: Internal Medicine | Admitting: Internal Medicine

## 2014-09-23 DIAGNOSIS — M810 Age-related osteoporosis without current pathological fracture: Secondary | ICD-10-CM

## 2014-12-14 DIAGNOSIS — I10 Essential (primary) hypertension: Secondary | ICD-10-CM | POA: Diagnosis not present

## 2014-12-14 DIAGNOSIS — L03119 Cellulitis of unspecified part of limb: Secondary | ICD-10-CM | POA: Diagnosis not present

## 2014-12-14 DIAGNOSIS — H2512 Age-related nuclear cataract, left eye: Secondary | ICD-10-CM | POA: Diagnosis not present

## 2014-12-14 DIAGNOSIS — H40013 Open angle with borderline findings, low risk, bilateral: Secondary | ICD-10-CM | POA: Diagnosis not present

## 2014-12-14 DIAGNOSIS — Z131 Encounter for screening for diabetes mellitus: Secondary | ICD-10-CM | POA: Diagnosis not present

## 2014-12-14 DIAGNOSIS — Z961 Presence of intraocular lens: Secondary | ICD-10-CM | POA: Diagnosis not present

## 2014-12-14 DIAGNOSIS — Z9889 Other specified postprocedural states: Secondary | ICD-10-CM | POA: Diagnosis not present

## 2015-03-11 ENCOUNTER — Encounter (HOSPITAL_COMMUNITY): Payer: Self-pay | Admitting: Emergency Medicine

## 2015-03-11 ENCOUNTER — Emergency Department (HOSPITAL_COMMUNITY)
Admission: EM | Admit: 2015-03-11 | Discharge: 2015-03-12 | Disposition: A | Discharge status: Home / Self Care | Payer: Medicare Other | Attending: Emergency Medicine | Admitting: Emergency Medicine

## 2015-03-11 DIAGNOSIS — Z79899 Other long term (current) drug therapy: Secondary | ICD-10-CM | POA: Diagnosis not present

## 2015-03-11 DIAGNOSIS — E785 Hyperlipidemia, unspecified: Secondary | ICD-10-CM | POA: Diagnosis not present

## 2015-03-11 DIAGNOSIS — Z8669 Personal history of other diseases of the nervous system and sense organs: Secondary | ICD-10-CM | POA: Insufficient documentation

## 2015-03-11 DIAGNOSIS — Z8739 Personal history of other diseases of the musculoskeletal system and connective tissue: Secondary | ICD-10-CM | POA: Insufficient documentation

## 2015-03-11 DIAGNOSIS — Z0000 Encounter for general adult medical examination without abnormal findings: Secondary | ICD-10-CM

## 2015-03-11 DIAGNOSIS — F329 Major depressive disorder, single episode, unspecified: Secondary | ICD-10-CM | POA: Diagnosis not present

## 2015-03-11 DIAGNOSIS — Z8709 Personal history of other diseases of the respiratory system: Secondary | ICD-10-CM | POA: Insufficient documentation

## 2015-03-11 DIAGNOSIS — I10 Essential (primary) hypertension: Secondary | ICD-10-CM | POA: Diagnosis present

## 2015-03-11 DIAGNOSIS — Z853 Personal history of malignant neoplasm of breast: Secondary | ICD-10-CM | POA: Diagnosis not present

## 2015-03-11 DIAGNOSIS — R51 Headache: Secondary | ICD-10-CM | POA: Diagnosis not present

## 2015-03-11 DIAGNOSIS — F410 Panic disorder [episodic paroxysmal anxiety] without agoraphobia: Secondary | ICD-10-CM | POA: Diagnosis not present

## 2015-03-11 HISTORY — DX: Essential (primary) hypertension: I10

## 2015-03-11 NOTE — ED Notes (Signed)
Pt from home c/o HTN crisis. She reports taking BP at home and Systolic was in the 794'C.  She reports that she has bought a new BP cuff and is worried if it calibrated correctly. She reports a headache. Denies dizziness and blurred vision currently. Pt alert and oriented.

## 2015-03-12 MED ORDER — ACETAMINOPHEN 500 MG PO TABS
1000.0000 mg | ORAL_TABLET | Freq: Once | ORAL | Status: AC
  Administered 2015-03-12: 1000 mg via ORAL
  Filled 2015-03-12: qty 2

## 2015-03-12 NOTE — ED Provider Notes (Signed)
CSN: [id number]     Arrival date & time 03/11/15  2341 History   First MD Initiated Contact with Patient 03/12/15 0005     Chief Complaint  Patient presents with  . Hypertension     (Consider location/radiation/quality/duration/timing/severity/associated sxs/prior Treatment) Patient is a 64 y.o. female presenting with hypertension. The history is provided by the patient. No language interpreter was used.  Hypertension Associated symptoms include headaches. Pertinent negatives include no chills or fever. Associated symptoms comments: She presents with concern for elevated blood pressure. She checks her pressure at home and got a new cuff that gave a reading of 180/118. No chest pain or SOB. No nausea or vomiting. She reports a mild frontal headache..    Past Medical History  Diagnosis Date  . Cataract immature     bilat.  . Hypothyroidism   . Hay fever   . Osteoarthritis   . Anxiety   . Insomnia   . Hyperlipidemia   . Breast cancer 1995  . Panic attacks   . Hx of mitral valve prolapse     has had no problems  . Snores     states occ. apnea, occ. wakes up coughing; has never had sleep study;  denies daytime sleepiness  . Depression   . Hypertension    Past Surgical History  Procedure Laterality Date  . Breast lumpectomy  1995    left  . Ganglion cyst excision    . Tonsillectomy    . Uterine fibroid surgery    . Breast reconstruction  04/01/2012    Procedure: BREAST RECONSTRUCTION;  Surgeon: Theodoro Kos, DO;  Location: [place];  Service: Plastics;  Laterality: Right;  Right breast mastopexy reduction    No family history on file. History  Substance Use Topics  . Smoking status: Never Smoker   . Smokeless tobacco: Never Used  . Alcohol Use: Yes     Comment: occasionally   OB History    No data available     Review of Systems  Constitutional: Negative for fever and chills.  Respiratory: Negative.   Cardiovascular: Negative.   Gastrointestinal:  Negative.   Musculoskeletal: Negative.   Skin: Negative.   Neurological: Positive for headaches.      Allergies  Adhesive and Codeine  Home Medications   Prior to Admission medications   Medication Sig Start Date End Date Taking? Authorizing Provider  ALPRAZolam Duanne Moron) 1 MG tablet Take 1 mg by mouth as needed.    Historical Provider, MD  AMBULATORY NON FORMULARY MEDICATION Medication Name: Boric Acid  Insert 1 Capsule into the vagina twice each week. 06/10/12   Ena Dawley, MD  calcium carbonate (OS-CAL) 600 MG TABS Take 600 mg by mouth 2 (two) times daily with a meal.    Historical Provider, MD  cholecalciferol (VITAMIN D) 1000 UNITS tablet Take 1,000 Units by mouth daily.    Historical Provider, MD  fish oil-omega-3 fatty acids 1000 MG capsule Take 2 g by mouth daily.    Historical Provider, MD  hydrochlorothiazide (MICROZIDE) 12.5 MG capsule  02/07/13   Historical Provider, MD  pravastatin (PRAVACHOL) 40 MG tablet Take 40 mg by mouth daily. PM    Historical Provider, MD  raloxifene (EVISTA) 60 MG tablet Take 60 mg by mouth daily. PM    Historical Provider, MD  sertraline (ZOLOFT) 50 MG tablet  02/02/13   Historical Provider, MD  traZODone (DESYREL) 50 MG tablet Take 50 mg by mouth at bedtime.    Historical Provider,  MD   BP 122/80 mmHg  Pulse 91  Temp(Src) 98 F (36.7 C) (Oral)  Resp 18  SpO2 95% Physical Exam  Constitutional: She is oriented to person, place, and time. She appears well-developed and well-nourished.  HENT:  Head: Normocephalic.  Neck: Normal range of motion. Neck supple.  Cardiovascular: Normal rate and regular rhythm.   Pulmonary/Chest: Effort normal and breath sounds normal.  Abdominal: Soft. Bowel sounds are normal. There is no tenderness. There is no rebound and no guarding.  Musculoskeletal: Normal range of motion. She exhibits no edema.  Neurological: She is alert and oriented to person, place, and time.  Skin: Skin is warm and dry. No rash  noted.  Psychiatric: She has a normal mood and affect.    ED Course  Procedures (including critical care time) Labs Review Labs Reviewed - No data to display  Imaging Review No results found.   EKG Interpretation None      MDM   Final diagnoses:  None    1. Normal exam  Blood pressure normal in the emergency department. She is comfortable and is appropriate for discharge.     Charlann Lange, PA-C 03/12/15 0021  Shanon Rosser, MD 03/12/15 [phone]

## 2015-03-12 NOTE — Discharge Instructions (Signed)

## 2015-03-22 DIAGNOSIS — Z86010 Personal history of colonic polyps: Secondary | ICD-10-CM | POA: Diagnosis not present

## 2015-05-11 DIAGNOSIS — E784 Other hyperlipidemia: Secondary | ICD-10-CM | POA: Diagnosis not present

## 2015-05-11 DIAGNOSIS — I10 Essential (primary) hypertension: Secondary | ICD-10-CM | POA: Diagnosis not present

## 2015-05-11 DIAGNOSIS — E669 Obesity, unspecified: Secondary | ICD-10-CM | POA: Diagnosis not present

## 2015-05-11 DIAGNOSIS — Z131 Encounter for screening for diabetes mellitus: Secondary | ICD-10-CM | POA: Diagnosis not present

## 2015-05-11 DIAGNOSIS — E039 Hypothyroidism, unspecified: Secondary | ICD-10-CM | POA: Diagnosis not present

## 2015-05-11 DIAGNOSIS — Z79899 Other long term (current) drug therapy: Secondary | ICD-10-CM | POA: Diagnosis not present

## 2015-05-17 DIAGNOSIS — E669 Obesity, unspecified: Secondary | ICD-10-CM | POA: Diagnosis not present

## 2015-05-17 DIAGNOSIS — E784 Other hyperlipidemia: Secondary | ICD-10-CM | POA: Diagnosis not present

## 2015-05-17 DIAGNOSIS — Z1239 Encounter for other screening for malignant neoplasm of breast: Secondary | ICD-10-CM | POA: Diagnosis not present

## 2015-05-17 DIAGNOSIS — B350 Tinea barbae and tinea capitis: Secondary | ICD-10-CM | POA: Diagnosis not present

## 2015-05-17 DIAGNOSIS — Z131 Encounter for screening for diabetes mellitus: Secondary | ICD-10-CM | POA: Diagnosis not present

## 2015-05-24 ENCOUNTER — Other Ambulatory Visit: Payer: Self-pay

## 2015-05-24 ENCOUNTER — Other Ambulatory Visit: Payer: Self-pay | Admitting: Internal Medicine

## 2015-05-24 DIAGNOSIS — Z1231 Encounter for screening mammogram for malignant neoplasm of breast: Secondary | ICD-10-CM

## 2015-05-24 DIAGNOSIS — N644 Mastodynia: Secondary | ICD-10-CM

## 2015-05-30 ENCOUNTER — Ambulatory Visit
Admission: RE | Admit: 2015-05-30 | Discharge: 2015-05-30 | Disposition: A | Discharge status: Home / Self Care | Payer: Medicare Other | Source: Ambulatory Visit | Attending: Internal Medicine | Admitting: Internal Medicine

## 2015-05-30 ENCOUNTER — Other Ambulatory Visit: Payer: Self-pay | Admitting: Internal Medicine

## 2015-05-30 DIAGNOSIS — N644 Mastodynia: Secondary | ICD-10-CM

## 2015-06-14 ENCOUNTER — Encounter (HOSPITAL_COMMUNITY): Payer: Self-pay | Admitting: Emergency Medicine

## 2015-06-14 ENCOUNTER — Emergency Department (INDEPENDENT_AMBULATORY_CARE_PROVIDER_SITE_OTHER)
Admission: EM | Admit: 2015-06-14 | Discharge: 2015-06-14 | Disposition: A | Discharge status: Home / Self Care | Payer: Medicare Other | Source: Home / Self Care | Attending: Family Medicine | Admitting: Family Medicine

## 2015-06-14 DIAGNOSIS — T148XXA Other injury of unspecified body region, initial encounter: Secondary | ICD-10-CM

## 2015-06-14 DIAGNOSIS — T148 Other injury of unspecified body region: Secondary | ICD-10-CM

## 2015-06-14 MED ORDER — DICLOFENAC SODIUM 75 MG PO TBEC
75.0000 mg | DELAYED_RELEASE_TABLET | Freq: Two times a day (BID) | ORAL | Status: DC
Start: 2015-06-14 — End: 2017-08-10

## 2015-06-14 NOTE — ED Notes (Signed)
Pt is having some mild pain in her left thigh laterally that started a few hours ago.

## 2015-06-14 NOTE — Discharge Instructions (Signed)
I'm very reassured that your condition is not from a blood clot. This technically is still a possibility and if you develop any leg swelling said onset chest pain or shortness of breath, or your condition does not improve then follow-up in the emergency room. Otherwise please use the Voltaren twice daily, apply heat and massage to her leg, and stretch the area regularly. This will likely improve over the next several days.

## 2015-06-14 NOTE — ED Provider Notes (Signed)
CSN: [id number]     Arrival date & time 06/14/15  1947 History   First MD Initiated Contact with Patient 06/14/15 1959     Chief Complaint  Patient presents with  . Leg Pain   (Consider location/radiation/quality/duration/timing/severity/associated sxs/prior Treatment) HPI   L leg pain. Started 2 hours ago. Laying in bed after taking a nap when it started. Achy. Posterior and lateral.  Worse w/ certain movements.  Improves w/ rest.  No recent medications changes ASA 81 w/o improvement.  No recent travel.  Denies chest pain, shortness breath, palpitations, lower extremity swelling, fevers, headache.  Past Medical History  Diagnosis Date  . Cataract immature     bilat.  . Hypothyroidism   . Hay fever   . Osteoarthritis   . Anxiety   . Insomnia   . Hyperlipidemia   . Breast cancer 1995  . Panic attacks   . Hx of mitral valve prolapse     has had no problems  . Snores     states occ. apnea, occ. wakes up coughing; has never had sleep study;  denies daytime sleepiness  . Depression   . Hypertension    Past Surgical History  Procedure Laterality Date  . Breast lumpectomy  1995    left  . Ganglion cyst excision    . Tonsillectomy    . Uterine fibroid surgery    . Breast reconstruction  04/01/2012    Procedure: BREAST RECONSTRUCTION;  Surgeon: Theodoro Kos, DO;  Location: [facility];  Service: Plastics;  Laterality: Right;  Right breast mastopexy reduction    Family History  Problem Relation Age of Onset  . Heart disease Other   . Diabetes Other    Social History  Substance Use Topics  . Smoking status: Never Smoker   . Smokeless tobacco: Never Used  . Alcohol Use: Yes     Comment: occasionally   OB History    No data available     Review of Systems Per HPI with all other pertinent systems negative.   Allergies  Adhesive and Codeine  Home Medications   Prior to Admission medications   Medication Sig Start Date End Date Taking?  Authorizing Provider  ALPRAZolam Duanne Moron) 1 MG tablet Take 1 mg by mouth as needed.    Historical Provider, MD  AMBULATORY NON FORMULARY MEDICATION Medication Name: Boric Acid  Insert 1 Capsule into the vagina twice each week. 06/10/12   Ena Dawley, MD  calcium carbonate (OS-CAL) 600 MG TABS Take 600 mg by mouth 2 (two) times daily with a meal.    Historical Provider, MD  cholecalciferol (VITAMIN D) 1000 UNITS tablet Take 1,000 Units by mouth daily.    Historical Provider, MD  diclofenac (VOLTAREN) 75 MG EC tablet Take 1 tablet (75 mg total) by mouth 2 (two) times daily. 06/14/15   Waldemar Dickens, MD  fish oil-omega-3 fatty acids 1000 MG capsule Take 2 g by mouth daily.    Historical Provider, MD  hydrochlorothiazide (MICROZIDE) 12.5 MG capsule  02/07/13   Historical Provider, MD  pravastatin (PRAVACHOL) 40 MG tablet Take 40 mg by mouth daily. PM    Historical Provider, MD  sertraline (ZOLOFT) 50 MG tablet  02/02/13   Historical Provider, MD  traZODone (DESYREL) 50 MG tablet Take 50 mg by mouth at bedtime.    Historical Provider, MD   Meds Ordered and Administered this Visit  Medications - No data to display  BP 116/82 mmHg  Pulse 74  Temp(Src)  98.6 F (37 C) (Oral)  Resp 18  SpO2 95% No data found.   Physical Exam Physical Exam  Constitutional: oriented to person, place, and time. appears well-developed and well-nourished. No distress.  HENT:  Head: Normocephalic and atraumatic.  Eyes: EOMI. PERRL.  Neck: Normal range of motion.  Cardiovascular: RRR, no m/r/g, 2+ distal pulses,  Pulmonary/Chest: Effort normal and breath sounds normal. No respiratory distress.  Abdominal: Soft. Bowel sounds are normal. NonTTP, no distension.  Musculoskeletal: Normal range of motion. Point tenderness in the posterior lateral distal left thigh., no effusion.  Neurological: alert and oriented to person, place, and time.  Skin: Skin is warm. No rash noted. non diaphoretic.  Psychiatric: normal  mood and affect. behavior is normal. Judgment and thought content normal.   ED Course  Procedures (including critical care time)  Labs Review Labs Reviewed - No data to display  Imaging Review No results found.   Visual Acuity Review  Right Eye Distance:   Left Eye Distance:   Bilateral Distance:    Right Eye Near:   Left Eye Near:    Bilateral Near:         MDM   1. Muscle strain    patient likely suffering from mild muscle strain of the posterior thigh. Voltaren, stretches, heat, massage. Very low suspicion for DVT and no need for further workup at this time. Patient given description of symptoms that would require emergent ED evaluation. Patient agreeable.  Waldemar Dickens, MD 06/14/15 2019

## 2015-06-27 ENCOUNTER — Other Ambulatory Visit: Payer: Self-pay | Admitting: Internal Medicine

## 2015-06-27 DIAGNOSIS — N6320 Unspecified lump in the left breast, unspecified quadrant: Secondary | ICD-10-CM

## 2015-07-28 DIAGNOSIS — F341 Dysthymic disorder: Secondary | ICD-10-CM | POA: Diagnosis not present

## 2015-07-28 DIAGNOSIS — Z23 Encounter for immunization: Secondary | ICD-10-CM | POA: Diagnosis not present

## 2015-07-28 DIAGNOSIS — R109 Unspecified abdominal pain: Secondary | ICD-10-CM | POA: Diagnosis not present

## 2015-07-28 DIAGNOSIS — N760 Acute vaginitis: Secondary | ICD-10-CM | POA: Diagnosis not present

## 2015-07-28 DIAGNOSIS — I10 Essential (primary) hypertension: Secondary | ICD-10-CM | POA: Diagnosis not present

## 2015-07-28 DIAGNOSIS — Z124 Encounter for screening for malignant neoplasm of cervix: Secondary | ICD-10-CM | POA: Diagnosis not present

## 2015-08-03 ENCOUNTER — Ambulatory Visit
Admission: RE | Admit: 2015-08-03 | Discharge: 2015-08-03 | Disposition: A | Discharge status: Home / Self Care | Payer: Medicare Other | Source: Ambulatory Visit | Attending: Internal Medicine | Admitting: Internal Medicine

## 2015-08-03 DIAGNOSIS — N6489 Other specified disorders of breast: Secondary | ICD-10-CM | POA: Diagnosis not present

## 2015-08-03 DIAGNOSIS — N6320 Unspecified lump in the left breast, unspecified quadrant: Secondary | ICD-10-CM

## 2016-01-24 DIAGNOSIS — L811 Chloasma: Secondary | ICD-10-CM | POA: Insufficient documentation

## 2016-01-24 DIAGNOSIS — L988 Other specified disorders of the skin and subcutaneous tissue: Secondary | ICD-10-CM | POA: Insufficient documentation

## 2016-05-03 ENCOUNTER — Ambulatory Visit (HOSPITAL_COMMUNITY)
Admission: EM | Admit: 2016-05-03 | Discharge: 2016-05-03 | Disposition: A | Discharge status: Home / Self Care | Payer: Medicare Other | Attending: Emergency Medicine | Admitting: Emergency Medicine

## 2016-05-03 ENCOUNTER — Encounter (HOSPITAL_COMMUNITY): Payer: Self-pay | Admitting: Emergency Medicine

## 2016-05-03 ENCOUNTER — Ambulatory Visit (INDEPENDENT_AMBULATORY_CARE_PROVIDER_SITE_OTHER): Payer: Medicare Other

## 2016-05-03 DIAGNOSIS — F329 Major depressive disorder, single episode, unspecified: Secondary | ICD-10-CM

## 2016-05-03 DIAGNOSIS — R531 Weakness: Secondary | ICD-10-CM

## 2016-05-03 DIAGNOSIS — R0982 Postnasal drip: Secondary | ICD-10-CM

## 2016-05-03 DIAGNOSIS — H8309 Labyrinthitis, unspecified ear: Secondary | ICD-10-CM

## 2016-05-03 DIAGNOSIS — R42 Dizziness and giddiness: Secondary | ICD-10-CM

## 2016-05-03 DIAGNOSIS — F32A Depression, unspecified: Secondary | ICD-10-CM

## 2016-05-03 LAB — POCT I-STAT, CHEM 8
BUN: 16 mg/dL (ref 6–20)
Calcium, Ion: 1.22 mmol/L (ref 1.12–1.23)
Chloride: 101 mmol/L (ref 101–111)
Creatinine, Ser: 0.7 mg/dL (ref 0.44–1.00)
Glucose, Bld: 101 mg/dL — ABNORMAL HIGH (ref 65–99)
HCT: 41 % (ref 36.0–46.0)
Hemoglobin: 13.9 g/dL (ref 12.0–15.0)
Potassium: 3.7 mmol/L (ref 3.5–5.1)
Sodium: 138 mmol/L (ref 135–145)
TCO2: 26 mmol/L (ref 0–100)

## 2016-05-03 MED ORDER — MECLIZINE HCL 25 MG PO TABS: ORAL_TABLET | ORAL | 0 refills | Status: DC

## 2016-05-03 NOTE — Discharge Instructions (Signed)
Read instructions particularly signs and symptoms that indicate warnings for something serious. If you develop increased dizziness, falls, chest pain, heaviness, tightness, pressure, shortness of breath, bad headache, vision changes, weakness one side of the body, slurred speech, palpitations of the heart or any other worrisome symptoms called 911 and go to the emergency department. Follow-up with your cardiologist next week.

## 2016-05-03 NOTE — ED Triage Notes (Signed)
Pt states that she has been suffering from dizziness for the last week.  She does not associate it with anything in particular.  She can be standing for a while or walking and will suddenly feel dizzy.  She has since noticed some bilateral pain in her neck below her ears and some hot flashes, not necessarily associated with one another.  She has also been more fatigued than usual.  She denies any new activity, new medications or injury.  She states she started a new "heart medication" about 6 weeks ago that begins with an "L", possibly.

## 2016-05-03 NOTE — ED Provider Notes (Signed)
CSN: [id number]     Arrival date & time 05/03/16  1602 History   First MD Initiated Contact with Patient 05/03/16 1650     Chief Complaint  Patient presents with  . Dizziness  . Fatigue  . Hot Flashes  . Neck Pain   (Consider location/radiation/quality/duration/timing/severity/associated sxs/prior Treatment) 65 year old postmenopausal female states that for one week she has been experiencing intermittent dizziness. It occurs only when standing. It will last for less than 1 minute. She states that she is having problems with her balance whenever this occurs. Does not seem to be affected by body position changes. It tends to come rather suddenly and unexpectedly. It is unpredictable. Her second complaint is that of anterior neck pain. Nothing seems to make it worse. She also complains of sinus drainage or PND and mild congestion.  She later adds that for the past 3 weeks or so she has been feeling overly tired and nearly exhausted. She states that she has been feeling a little depressed. She has been staying with her mother who has been ill.      Past Medical History:  Diagnosis Date  . Anxiety   . Breast cancer ([place]) 1995  . Cataract immature    bilat.  . Depression   . Hay fever   . Hx of mitral valve prolapse    has had no problems  . Hyperlipidemia   . Hypertension   . Hypothyroidism   . Insomnia   . Osteoarthritis   . Panic attacks   . Snores    states occ. apnea, occ. wakes up coughing; has never had sleep study;  denies daytime sleepiness   Past Surgical History:  Procedure Laterality Date  . BREAST LUMPECTOMY  1995   left  . BREAST RECONSTRUCTION  04/01/2012   Procedure: BREAST RECONSTRUCTION;  Surgeon: Theodoro Kos, DO;  Location: [facility];  Service: Plastics;  Laterality: Right;  Right breast mastopexy reduction   . GANGLION CYST EXCISION    . TONSILLECTOMY    . UTERINE FIBROID SURGERY     Family History  Problem Relation Age of Onset  .  Heart disease Other   . Diabetes Other    Social History  Substance Use Topics  . Smoking status: Never Smoker  . Smokeless tobacco: Never Used  . Alcohol use Yes     Comment: occasionally   OB History    No data available     Review of Systems  Constitutional: Positive for activity change and fatigue. Negative for fever.  HENT: Positive for congestion and postnasal drip. Negative for ear discharge, ear pain, sinus pressure, sneezing, sore throat and trouble swallowing.   Eyes: Negative.  Negative for visual disturbance.  Respiratory: Negative for cough, chest tightness, wheezing and stridor.   Cardiovascular: Negative for chest pain, palpitations and leg swelling.  Gastrointestinal: Negative.   Genitourinary: Negative.   Musculoskeletal: Negative.   Skin: Negative.   Neurological: Positive for dizziness. Negative for tremors, seizures, syncope, facial asymmetry, speech difficulty, weakness, numbness and headaches.  Psychiatric/Behavioral: Negative.   All other systems reviewed and are negative.   Allergies  Adhesive [tape] and Codeine  Home Medications   Prior to Admission medications   Medication Sig Start Date End Date Taking? Authorizing Provider  ALPRAZolam Duanne Moron) 1 MG tablet Take 1 mg by mouth as needed.   Yes Historical Provider, MD  pravastatin (PRAVACHOL) 40 MG tablet Take 40 mg by mouth daily. PM   Yes Historical Provider, MD  sertraline (  ZOLOFT) 50 MG tablet  02/02/13  Yes Historical Provider, MD  traZODone (DESYREL) 50 MG tablet Take 50 mg by mouth at bedtime.   Yes Historical Provider, MD  AMBULATORY NON FORMULARY MEDICATION Medication Name: Boric Acid  Insert 1 Capsule into the vagina twice each week. 06/10/12   Ena Dawley, MD  calcium carbonate (OS-CAL) 600 MG TABS Take 600 mg by mouth 2 (two) times daily with a meal.    Historical Provider, MD  cholecalciferol (VITAMIN D) 1000 UNITS tablet Take 1,000 Units by mouth daily.    Historical Provider, MD   diclofenac (VOLTAREN) 75 MG EC tablet Take 1 tablet (75 mg total) by mouth 2 (two) times daily. 06/14/15   Waldemar Dickens, MD  fish oil-omega-3 fatty acids 1000 MG capsule Take 2 g by mouth daily.    Historical Provider, MD  hydrochlorothiazide (MICROZIDE) 12.5 MG capsule  02/07/13   Historical Provider, MD  meclizine (ANTIVERT) 25 MG tablet 1/2 to 1 tab po q 8h prn dizziness 05/03/16   Janne Napoleon, NP   Meds Ordered and Administered this Visit  Medications - No data to display  BP 125/82 (BP Location: Right Arm)   Pulse 72   Temp 97.8 F (36.6 C) (Oral)   Resp 16   SpO2 95%  Orthostatic VS for the past 24 hrs:  BP- Lying Pulse- Lying BP- Sitting Pulse- Sitting BP- Standing at 0 minutes Pulse- Standing at 0 minutes  05/03/16 1732 120/80 65 118/78 60 118/83 77    Physical Exam  Constitutional: She is oriented to person, place, and time. She appears well-developed and well-nourished. No distress.  HENT:  Head: Normocephalic and atraumatic.  Mouth/Throat: Oropharynx is clear and moist. No oropharyngeal exudate.  Left TM obscured by cerumen. Right TM appears normal. No erythema or effusion.  Oropharynx with minor erythema and scant clear PND. Exudates or swelling.  Eyes: Conjunctivae and EOM are normal. Pupils are equal, round, and reactive to light.  Neck: Normal range of motion. Neck supple. No thyromegaly present.  No palpable nodes or other nodules. No tenderness to the anterior neck.  Cardiovascular: Normal rate, regular rhythm, normal heart sounds and intact distal pulses.   No murmur heard. Pulmonary/Chest: Effort normal and breath sounds normal. No respiratory distress. She has no wheezes. She has no rales.  Abdominal: Soft. There is no tenderness.  Musculoskeletal: Normal range of motion. She exhibits no edema.  Lymphadenopathy:    She has no cervical adenopathy.  Neurological: She is alert and oriented to person, place, and time. She has normal strength. She displays no  tremor. No cranial nerve deficit or sensory deficit. She exhibits normal muscle tone. GCS eye subscore is 4. GCS verbal subscore is 5. GCS motor subscore is 6.  Romberg: Sways but maintains station Heel to toe falls out of line twice. Finger to nose coordinated and intact. Bilateral upper and lower extremity strength is 4 over 4.  Skin: Skin is warm and dry.  Psychiatric: She has a normal mood and affect.  Nursing note and vitals reviewed.   Urgent Care Course   Clinical Course  ED ECG REPORT   Date: 05/03/2016  Rate: 57  Rhythm: sinus bradycardia  QRS Axis: left  Intervals: normal  ST/T Wave abnormalities: normal  Conduction Disutrbances:none  Narrative Interpretation:   Old EKG Reviewed: none available  I have personally reviewed the EKG tracing and agree with the computerized printout as noted.   Procedures (including critical care time)  Labs Review  Labs Reviewed  POCT I-STAT, CHEM 8 - Abnormal; Notable for the following:       Result Value   Glucose, Bld 101 (*)    All other components within normal limits    Imaging Review Dg Chest 2 View  Result Date: 05/03/2016 CLINICAL DATA:  Intermittent dizziness, dyspnea for 1 week EXAM: CHEST  2 VIEW COMPARISON:  03/29/2010 FINDINGS: Cardiomediastinal silhouette is stable. There is atelectasis or scarring left base. No infiltrate or pulmonary edema. Surgical clips in left axilla again noted. Bony thorax is unremarkable. IMPRESSION: No infiltrate or pulmonary edema. Atelectasis or scarring left base. Surgical clips are noted in left axilla. Electronically Signed   By: Lahoma Crocker M.D.   On: 05/03/2016 19:04     Visual Acuity Review  Right Eye Distance:   Left Eye Distance:   Bilateral Distance:    Right Eye Near:   Left Eye Near:    Bilateral Near:         MDM   1. Dizziness   2. Labyrinthitis, unspecified laterality   3. Depression   4. Weakness   5. PND (post-nasal drip)    Read instructions particularly  signs and symptoms that indicate warnings for something serious. If you develop increased dizziness, falls, chest pain, heaviness, tightness, pressure, shortness of breath, bad headache, vision changes, weakness one side of the body, slurred speech, palpitations of the heart or any other worrisome symptoms called 911 and go to the emergency department. Follow-up with your cardiologist next week. Meds ordered this encounter  Medications  . meclizine (ANTIVERT) 25 MG tablet    Sig: 1/2 to 1 tab po q 8h prn dizziness    Dispense:  20 tablet    Refill:  0    Order Specific Question:   Supervising Provider    Answer:   Sherlene Shams [id number]   Nursing notes regarding pulse oximetry indicate variable numbers between 91-97% at rest and with walking. She is wearing rather thick gel nail polish which interferes with the reading. She does not become short of breath or tachypneic during the ambulation measurements or at rest. She has developed no chest discomfort all. Likely her dizziness is coming from the labyrinthitis and she has had some upper respiratory congestion and drainage recently. Fatigue and tiredness may be explained by her admission of depression. She is discharged home and is given signs and symptoms, red flags to notice and call 911 should she get worse. She will follow-up with cardiologist next week. She is currently alert and oriented, stable she appears well and currently asymptomatic.    Janne Napoleon, NP 05/03/16 [id number]

## 2016-05-03 NOTE — ED Notes (Addendum)
Pt's O2 Sats were low during the entire procedure, but pt was asymptomatic: Lying 89% Sitting 92% Standing 91%

## 2016-05-03 NOTE — ED Notes (Addendum)
Checked pt's O2 Sats with ambulation.  Pt was 99-100% initially, then dropped to 93% for a few seconds.  The rest of the walk she was a steady 95% then she dropped to 93% again, and when we turned the last corner she told me she felt dizzy.  Her O2 Sat was 86%, but then quickly crept back up to 93%.  Pt returned to 95% when sitting down and a few seconds later it was 98%.  Other than the quick dizzy spell, she was asymptomatic for the rest of the walk.  Elaine Napoleon, NP notified.

## 2016-05-15 ENCOUNTER — Ambulatory Visit (HOSPITAL_COMMUNITY)
Admission: EM | Admit: 2016-05-15 | Discharge: 2016-05-15 | Disposition: A | Discharge status: Home / Self Care | Payer: Medicare Other | Attending: Emergency Medicine | Admitting: Emergency Medicine

## 2016-05-15 ENCOUNTER — Encounter (HOSPITAL_COMMUNITY): Payer: Self-pay | Admitting: Emergency Medicine

## 2016-05-15 DIAGNOSIS — M7662 Achilles tendinitis, left leg: Secondary | ICD-10-CM

## 2016-05-15 DIAGNOSIS — M25511 Pain in right shoulder: Secondary | ICD-10-CM

## 2016-05-15 MED ORDER — NAPROXEN 500 MG PO TABS: 500.0000 mg | ORAL_TABLET | Freq: Two times a day (BID) | ORAL | 0 refills | Status: DC

## 2016-05-15 NOTE — ED Triage Notes (Signed)
Patient has multiple complaints.  Patient reports left calf started hurting around 12 noon today.  Patient is specifically concerned for a blood clot.  Patient has not had a blood clot, nor has a family member.  Patient reports she has not moved around much today and concerned this may be a clot.  Patient has had no injury.  Pain is intermittent  Patient also has right shoulder pain.  Pain worsens with movement.  Pain onset two days ago.  No known injury.

## 2016-05-31 NOTE — ED Provider Notes (Signed)
CSN: [id number]     Arrival date & time 05/15/16  1843 History   None    Chief Complaint  Patient presents with  . Leg Pain  . Shoulder Pain   (Consider location/radiation/quality/duration/timing/severity/associated sxs/prior Treatment) C/o pain in right shoulder and right ankle.   The history is provided by the patient.  Leg Pain  Location:  Ankle Time since incident:  1 day Ankle location:  R ankle Pain details:    Quality:  Aching   Radiates to:  Does not radiate   Severity:  Mild   Onset quality:  Sudden   Timing:  Constant Chronicity:  New Dislocation: no   Foreign body present:  No foreign bodies Tetanus status:  Unknown Relieved by:  Nothing Worsened by:  Nothing Ineffective treatments:  None tried Shoulder Pain    Past Medical History:  Diagnosis Date  . Anxiety   . Breast cancer ([place]) 1995  . Cataract immature    bilat.  . Depression   . Hay fever   . Hx of mitral valve prolapse    has had no problems  . Hyperlipidemia   . Hypertension   . Hypothyroidism   . Insomnia   . Osteoarthritis   . Panic attacks   . Snores    states occ. apnea, occ. wakes up coughing; has never had sleep study;  denies daytime sleepiness   Past Surgical History:  Procedure Laterality Date  . BREAST LUMPECTOMY  1995   left  . BREAST RECONSTRUCTION  04/01/2012   Procedure: BREAST RECONSTRUCTION;  Surgeon: Theodoro Kos, DO;  Location: [facility];  Service: Plastics;  Laterality: Right;  Right breast mastopexy reduction   . GANGLION CYST EXCISION    . TONSILLECTOMY    . UTERINE FIBROID SURGERY     Family History  Problem Relation Age of Onset  . Heart disease Other   . Diabetes Other    Social History  Substance Use Topics  . Smoking status: Never Smoker  . Smokeless tobacco: Never Used  . Alcohol use Yes     Comment: occasionally   OB History    No data available     Review of Systems  Constitutional: Negative.   HENT: Negative.   Eyes:  Negative.   Respiratory: Negative.   Cardiovascular: Negative.   Gastrointestinal: Negative.   Endocrine: Negative.   Genitourinary: Negative.   Musculoskeletal: Positive for arthralgias.  Allergic/Immunologic: Negative.   Neurological: Negative.   Hematological: Negative.   Psychiatric/Behavioral: Negative.     Allergies  Adhesive [tape] and Codeine  Home Medications   Prior to Admission medications   Medication Sig Start Date End Date Taking? Authorizing Provider  hydrochlorothiazide (MICROZIDE) 12.5 MG capsule  02/07/13  Yes Historical Provider, MD  pravastatin (PRAVACHOL) 40 MG tablet Take 40 mg by mouth daily. PM   Yes Historical Provider, MD  ALPRAZolam Duanne Moron) 1 MG tablet Take 1 mg by mouth as needed.    Historical Provider, MD  AMBULATORY NON FORMULARY MEDICATION Medication Name: Boric Acid  Insert 1 Capsule into the vagina twice each week. 06/10/12   Ena Dawley, MD  calcium carbonate (OS-CAL) 600 MG TABS Take 600 mg by mouth 2 (two) times daily with a meal.    Historical Provider, MD  cholecalciferol (VITAMIN D) 1000 UNITS tablet Take 1,000 Units by mouth daily.    Historical Provider, MD  diclofenac (VOLTAREN) 75 MG EC tablet Take 1 tablet (75 mg total) by mouth 2 (two) times daily. 06/14/15  Waldemar Dickens, MD  fish oil-omega-3 fatty acids 1000 MG capsule Take 2 g by mouth daily.    Historical Provider, MD  meclizine (ANTIVERT) 25 MG tablet 1/2 to 1 tab po q 8h prn dizziness 05/03/16   Janne Napoleon, NP  naproxen (NAPROSYN) 500 MG tablet Take 1 tablet (500 mg total) by mouth 2 (two) times daily with a meal. 05/15/16   Lysbeth Penner, FNP  sertraline (ZOLOFT) 50 MG tablet  02/02/13   Historical Provider, MD  traZODone (DESYREL) 50 MG tablet Take 50 mg by mouth at bedtime.    Historical Provider, MD   Meds Ordered and Administered this Visit  Medications - No data to display  BP 120/78 (BP Location: Right Arm)   Pulse 81   Temp 98.2 F (36.8 C) (Oral)   Resp 16    SpO2 96%  No data found.   Physical Exam  Constitutional: She is oriented to person, place, and time. She appears well-developed and well-nourished.  HENT:  Head: Normocephalic and atraumatic.  Right Ear: External ear normal.  Left Ear: External ear normal.  Eyes: Conjunctivae and EOM are normal.  Neck: Normal range of motion.  Cardiovascular: Normal rate, regular rhythm and normal heart sounds.   Pulmonary/Chest: Effort normal.  Abdominal: Soft.  Musculoskeletal: Normal range of motion.  Neurological: She is alert and oriented to person, place, and time.  Nursing note and vitals reviewed.   Urgent Care Course   Clinical Course    Procedures (including critical care time)  Labs Review Labs Reviewed - No data to display  Imaging Review No results found.   Visual Acuity Review  Right Eye Distance:   Left Eye Distance:   Bilateral Distance:    Right Eye Near:   Left Eye Near:    Bilateral Near:         MDM  1. Shoulder pain 2. Ankle pain   Reassured no PE or DVT  And take naprosyn 500mg  po bid x 10 days   Lysbeth Penner, FNP 05/31/16 1317    Lysbeth Penner, FNP 05/31/16 1318

## 2016-07-04 ENCOUNTER — Ambulatory Visit (HOSPITAL_BASED_OUTPATIENT_CLINIC_OR_DEPARTMENT_OTHER): Payer: Medicare Other | Attending: Internal Medicine | Admitting: Internal Medicine

## 2016-07-04 VITALS — Ht 62.0 in | Wt 185.0 lb

## 2016-07-04 DIAGNOSIS — G4730 Sleep apnea, unspecified: Secondary | ICD-10-CM

## 2016-07-04 DIAGNOSIS — G4736 Sleep related hypoventilation in conditions classified elsewhere: Secondary | ICD-10-CM | POA: Diagnosis not present

## 2016-07-04 DIAGNOSIS — R0683 Snoring: Secondary | ICD-10-CM | POA: Diagnosis not present

## 2016-07-07 DIAGNOSIS — G4730 Sleep apnea, unspecified: Secondary | ICD-10-CM

## 2016-07-07 NOTE — Procedures (Signed)
  Patient Name: Elaine Simon, Elaine Simon Date: 07/04/2016 Gender: Female D.O.B: Jan 16, 1951 Age (years): 47 Referring Provider: Nolene Ebbs Height (inches): 62 Interpreting Physician: Baird Lyons MD, ABSM Weight (lbs): 185 RPSGT: Earney Hamburg BMI: 34 MRN: [id number] Neck Size: 14.50 CLINICAL INFORMATION Sleep Study Type: NPSG   Indication for sleep study: OSA   Epworth Sleepiness Score: 5   SLEEP STUDY TECHNIQUE As per the AASM Manual for the Scoring of Sleep and Associated Events v2.3 (April 2016) with a hypopnea requiring 4% desaturations. The channels recorded and monitored were frontal, central and occipital EEG, electrooculogram (EOG), submentalis EMG (chin), nasal and oral airflow, thoracic and abdominal wall motion, anterior tibialis EMG, snore microphone, electrocardiogram, and pulse oximetry.  MEDICATIONS Medications self-administered by patient taken the night of the study : XANAX, HYDROCHLOROTHIAZIDE, VITAMIN D PLUS CALCIUM, PRAVASTATIN, SERTRALINE, TRAZODONE  SLEEP ARCHITECTURE The study was initiated at 10:52:17 PM and ended at 4:56:01 AM. Sleep onset time was 19.9 minutes and the sleep efficiency was 84.3%. The total sleep time was 306.5 minutes. Stage REM latency was 300.5 minutes. The patient spent 2.12% of the night in stage N1 sleep, 88.42% in stage N2 sleep, 0.00% in stage N3 and 9.46% in REM. Alpha intrusion was absent. Supine sleep was 32.35%.  RESPIRATORY PARAMETERS The overall apnea/hypopnea index (AHI) was 0.6 per hour. There were 0 total apneas, including 0 obstructive, 0 central and 0 mixed apneas. There were 3 hypopneas and 0 RERAs. The AHI during Stage REM sleep was 0.0 per hour. AHI while supine was 1.8 per hour. The mean oxygen saturation was 92.65%. The minimum SpO2 during sleep was 87.00%. Loud snoring was noted during this study.  CARDIAC DATA The 2 lead EKG demonstrated sinus rhythm. The mean heart rate was 74.00 beats per  minute. Other EKG findings include: None.  LEG MOVEMENT DATA The total PLMS were 0 with a resulting PLMS index of 0.00. Associated arousal with leg movement index was 0.0  . IMPRESSIONS - No significant obstructive sleep apnea occurred during this study (AHI = 0.6/h). - No significant central sleep apnea occurred during this study (CAI = 0.0/h). - Oxygen desaturation was noted during this study (Min O2 = 87.00%), Mean 92.7%. - The patient snored with Loud snoring volume. - No cardiac abnormalities were noted during this study. - Clinically significant periodic limb movements did not occur during sleep. No significant associated arousals. - Frequent cough noted while sleeping supine. DIAGNOSIS - Nocturnal Hypoxemia (327.26 [G47.36 ICD-10]) - Primary Snoring (786.09 [R06.83 ICD-10])  RECOMMENDATIONS - Avoid alcohol, sedatives and other CNS depressants that may worsen sleep apnea and disrupt normal sleep architecture. - Sleep hygiene should be reviewed to assess factors that may improve sleep quality. - Weight management and regular exercise should be initiated or continued if appropriate. - Consider possibility of reflux during sleep triggering cough.  [Electronically signed] 07/07/2016 01:56 PM  Baird Lyons MD, [place], American Board of Sleep Medicine   NPI: [id number]  [place], American Board of Sleep Medicine  ELECTRONICALLY SIGNED ON:  07/07/2016, 1:49 PM [place] PH: [phone]   FX: [phone] [place]

## 2016-08-26 ENCOUNTER — Encounter: Payer: Self-pay | Admitting: *Deleted

## 2016-08-29 ENCOUNTER — Telehealth: Payer: Self-pay

## 2016-08-29 NOTE — Telephone Encounter (Signed)
LM for Elaine Simon that Dr. Marko Plume said that she has not been seen here at the cancer center in over 2 yrs.  She should see her PCP to be evaluated.

## 2016-09-19 ENCOUNTER — Encounter: Payer: Self-pay | Admitting: Oncology

## 2016-09-19 ENCOUNTER — Other Ambulatory Visit: Payer: Self-pay | Admitting: Oncology

## 2016-09-19 DIAGNOSIS — Z853 Personal history of malignant neoplasm of breast: Secondary | ICD-10-CM

## 2016-09-19 NOTE — Telephone Encounter (Signed)
Pt called and said she left a message before christmas about shoulder pain and swelling in her neck area and had not received a return call. After mentioning Barbaraann Share did leave her a message to see her PCP she did recall this. Pt said she disagrees with Dr Marko Plume and feels she needs to be seen by oncologist. She has many friends with cancer who have had reoccurance after many years. She is concerned that PCP would just see her and give her a referral to cancer center. She did say she will go to her PCP per Dr Marko Plume but would like to get an appt at [facility] as soon as possible. This nurse did not tell pt Dr Marko Plume is retiring.

## 2016-09-19 NOTE — Progress Notes (Unsigned)
Medical Oncology  RN phone note from 09-19-16 seen. Scheduling message sent for apt with Dr Marko Plume 09-30-16 + lab, as patient requests.  Godfrey Pick, MD

## 2016-09-25 ENCOUNTER — Telehealth: Payer: Self-pay | Admitting: Oncology

## 2016-09-25 NOTE — Telephone Encounter (Signed)
lvm to inform pt of added appt on 1/15 per LL LOS

## 2016-09-29 ENCOUNTER — Other Ambulatory Visit: Payer: Self-pay | Admitting: Oncology

## 2016-09-30 ENCOUNTER — Other Ambulatory Visit: Payer: Self-pay

## 2016-09-30 ENCOUNTER — Other Ambulatory Visit (HOSPITAL_BASED_OUTPATIENT_CLINIC_OR_DEPARTMENT_OTHER): Payer: Medicare Other

## 2016-09-30 ENCOUNTER — Ambulatory Visit (HOSPITAL_BASED_OUTPATIENT_CLINIC_OR_DEPARTMENT_OTHER): Payer: Medicare Other | Admitting: Oncology

## 2016-09-30 VITALS — BP 122/73 | HR 88 | Temp 97.6°F | Resp 18 | Wt 193.2 lb

## 2016-09-30 DIAGNOSIS — Z853 Personal history of malignant neoplasm of breast: Secondary | ICD-10-CM

## 2016-09-30 DIAGNOSIS — M7989 Other specified soft tissue disorders: Secondary | ICD-10-CM

## 2016-09-30 DIAGNOSIS — N644 Mastodynia: Secondary | ICD-10-CM

## 2016-09-30 LAB — CBC WITH DIFFERENTIAL/PLATELET
BASO%: 0.2 % (ref 0.0–2.0)
Basophils Absolute: 0 10*3/uL (ref 0.0–0.1)
EOS%: 1.6 % (ref 0.0–7.0)
Eosinophils Absolute: 0.1 10*3/uL (ref 0.0–0.5)
HCT: 38.7 % (ref 34.8–46.6)
HGB: 12.8 g/dL (ref 11.6–15.9)
LYMPH%: 47.6 % (ref 14.0–49.7)
MCH: 29.8 pg (ref 25.1–34.0)
MCHC: 33.1 g/dL (ref 31.5–36.0)
MCV: 90.2 fL (ref 79.5–101.0)
MONO#: 0.2 10*3/uL (ref 0.1–0.9)
MONO%: 4.6 % (ref 0.0–14.0)
NEUT#: 2.3 10*3/uL (ref 1.5–6.5)
NEUT%: 46 % (ref 38.4–76.8)
Platelets: 306 10*3/uL (ref 145–400)
RBC: 4.29 10*6/uL (ref 3.70–5.45)
RDW: 13.7 % (ref 11.2–14.5)
WBC: 5 10*3/uL (ref 3.9–10.3)
lymph#: 2.4 10*3/uL (ref 0.9–3.3)

## 2016-09-30 LAB — COMPREHENSIVE METABOLIC PANEL
ALT: 17 U/L (ref 0–55)
AST: 16 U/L (ref 5–34)
Albumin: 4 g/dL (ref 3.5–5.0)
Alkaline Phosphatase: 66 U/L (ref 40–150)
Anion Gap: 9 mEq/L (ref 3–11)
BUN: 19.1 mg/dL (ref 7.0–26.0)
CO2: 26 mEq/L (ref 22–29)
Calcium: 10 mg/dL (ref 8.4–10.4)
Chloride: 103 mEq/L (ref 98–109)
Creatinine: 1.1 mg/dL (ref 0.6–1.1)
EGFR: 62 mL/min/{1.73_m2} — ABNORMAL LOW (ref >90)
Glucose: 101 mg/dl (ref 70–140)
Potassium: 4 mEq/L (ref 3.5–5.1)
Sodium: 137 mEq/L (ref 136–145)
Total Bilirubin: 0.33 mg/dL (ref 0.20–1.20)
Total Protein: 7.9 g/dL (ref 6.4–8.3)

## 2016-09-30 NOTE — Progress Notes (Signed)
OFFICE PROGRESS NOTE   October 02, 2016   Physicians: E.Kelby Fam, Art Raphael Gibney, Carol Ada, ([place])  INTERVAL HISTORY:  Patient is seen, alone for visit, at her request and for first time back at this office since 03-2014. She has history of stage 1 left breast cancer ~ 22 years ago. Patient requested visit due to left shoulder pain and "swelling in neck".  PCP is Dr Jeanie Cooks, whom she saw last week. She is overdue mammograms, last at [facility] 05-2015.  She is followed for colon polyps by Dr Benson Norway, saw him recently.  Patient reports some nontender swelling lower cervical area on left before Christmas, which has resolved. She has some soreness lower left axilla into lateral breast with direct pressure, which is not new. She does not describe pain in left shoulder joint, with good ROM there and no noted swelling LUE. She is not aware of any changes in breasts otherwise.  She had acute injury to low back recently as she was lifting garage door, improving with muscle relaxant by PCP. Energy and appetite are good. She is not exercising regularly, dislikes weight gain now and plans to resume. She has had no recent infectious illness. No SOB except with more strenuos exertion, no cough or chest pain. No other bone pain. No bleeding. No LE swelling. Bowels and bladder ok. No abdominal or pelvic discomfort.  Remainder of 14 point Review of Systems negative.    Flu vaccine 07-04-16 Has not had genetics counseling, tho was premenopausal with the breast cancer. Referral made now.   ONCOLOGIC HISTORY Per patient's history, stage 1 left breast cancer found on self exam 1995, when patient was premenopausal. She had lumpectomy with 19 axillary nodes removed at [facility], per Dr Laurelyn Sickle note 01-09-12 thought ER +. She had adjuvant chemotherapy with 4-6 cycles of "a red medicine and a pink medicine" in [facility] and radiation in [place]. She declined tamoxifen but did take Evista for low bone  density.  This information is from patient and Dr Laurelyn Sickle consultation note of 01-09-12, as additional records are not available in this EMR.    Objective:  Vital signs in last 24 hours:  BP 122/73 (BP Location: Left Arm)   Pulse 88   Temp 97.6 F (36.4 C) (Oral)   Resp 18   Wt 193 lb 3.2 oz (87.6 kg)   SpO2 95%   BMI 35.34 kg/m  Weight was 190 in 03-2014.  Alert, oriented and appropriate, looks comfortable, pleasant and talkative. Ambulatory without difficulty.   HEENT:PERRL, sclerae not icteric. Oral mucosa moist without lesions, posterior pharynx clear.  Neck supple. No JVD.  Lymphatics: She has symmetrical prominent fat pads in supraclavicular areas bilaterally, with no palpable cervical or supraclavicular nodes that I can tell. No axillary or inguinal adenopathy Resp: clear to auscultation bilaterally and normal percussion bilaterally Cardio: regular rate and rhythm. No gallop. GI: abdomen soft, nontender, not distended, no mass or organomegaly. Normally active bowel sounds.  Musculoskeletal/ Extremities: UE/ LE without pitting edema, cords, tenderness. Good ROM bilateral shoulders and no tenderness to palpation C spine.  Neuro: no focal deficits. PSYCH appropriate mood and affect Skin without rash, ecchymosis, petechiae Breasts: left breast with well healed lumpectomy scar superior lateral areola with slight thickening there which does not appear malignant. Left breast otherwise without dominant mass, skin or nipple findings, some tenderness in area of axillary scar without mass or other findings.  Right breast with scars from reduction mammoplasty, no dominant mass, skin or nipple  findings. Axillae without palpable adenopathy.  Portacath-without erythema or tenderness  Lab Results:  Results for orders placed or performed in visit on 09/30/16  CBC with Differential  Result Value Ref Range   WBC 5.0 3.9 - 10.3 10e3/uL   NEUT# 2.3 1.5 - 6.5 10e3/uL   HGB 12.8 11.6 - 15.9 g/dL    HCT 38.7 34.8 - 46.6 %   Platelets 306 145 - 400 10e3/uL   MCV 90.2 79.5 - 101.0 fL   MCH 29.8 25.1 - 34.0 pg   MCHC 33.1 31.5 - 36.0 g/dL   RBC 4.29 3.70 - 5.45 10e6/uL   RDW 13.7 11.2 - 14.5 %   lymph# 2.4 0.9 - 3.3 10e3/uL   MONO# 0.2 0.1 - 0.9 10e3/uL   Eosinophils Absolute 0.1 0.0 - 0.5 10e3/uL   Basophils Absolute 0.0 0.0 - 0.1 10e3/uL   NEUT% 46.0 38.4 - 76.8 %   LYMPH% 47.6 14.0 - 49.7 %   MONO% 4.6 0.0 - 14.0 %   EOS% 1.6 0.0 - 7.0 %   BASO% 0.2 0.0 - 2.0 %  Comprehensive metabolic panel  Result Value Ref Range   Sodium 137 136 - 145 mEq/L   Potassium 4.0 3.5 - 5.1 mEq/L   Chloride 103 98 - 109 mEq/L   CO2 26 22 - 29 mEq/L   Glucose 101 70 - 140 mg/dl   BUN 19.1 7.0 - 26.0 mg/dL   Creatinine 1.1 0.6 - 1.1 mg/dL   Total Bilirubin 0.33 0.20 - 1.20 mg/dL   Alkaline Phosphatase 66 40 - 150 U/L   AST 16 5 - 34 U/L   ALT 17 0 - 55 U/L   Total Protein 7.9 6.4 - 8.3 g/dL   Albumin 4.0 3.5 - 5.0 g/dL   Calcium 10.0 8.4 - 10.4 mg/dL   Anion Gap 9 3 - 11 mEq/L   EGFR 62 (L) >90 ml/min/1.73 m2  Labs reviewed with patient.   Studies/Results:  No results found.  Confirmed with patient that last mammograms were at [facility] 05-2015.; she also had [place] left breast 07-2015 and CXR 04-2016, reports reviewed by MD now.   EXAM: CHEST  2 VIEW  05-03-16 COMPARISON:  03/29/2010  FINDINGS: Cardiomediastinal silhouette is stable. There is atelectasis or scarring left base. No infiltrate or pulmonary edema. Surgical clips in left axilla again noted. Bony thorax is unremarkable.  IMPRESSION: No infiltrate or pulmonary edema. Atelectasis or scarring left base. Surgical clips are noted in left axilla.   Medications: I have reviewed the patient's current medications.  DISCUSSION  Nothing obviously of concern on exam, however we will set up the mammograms that are overdue and also left breast US. She will see one of our breast medical oncologists in ~ 6-8 weeks, to be sure  nothing else of concern. If doing well then, patient would be very interested in referral to Breast Survivorship Clinic.  We have discussed referral to genetics due to premenopausal breast cancer, which patient would also like to do.   I have encouraged her to begin regular exercise, aiming for extra 5000 steps/ daily above present baseline. She has FitBit and is interested in trying this.   Assessment/Plan:   1. Early stage premenopausal left breast cancer: post lumpectomy and 19 node axillary dissection 1995, adjuvant chemotherapy likely with adriamycin and cytoxan (records not available), and radiation. Likely ER + as history of tamoxifen recommendation, which patient declined. No known active disease since that treatment completed. Mammograms overdue by a  few months, ordered now, with [place] left axilla where she has soreness.  She will meet one of the breast medical oncologists as above, then may be appropriate for Breast Survivorship Clinic follow up. 2.obesity: BMI 35. Discussed weight loss to ideal, needs to start daily exercise 3.history of colon polyps, followed by Dr Benson Norway 4.osteoporosis in spine by DEXA 07-2012, T score -3.3. On calcium and Vit D. Increase weight bearing exercise as above. Has been on Evista in past 5.anxiety/ depression on zoloft and trazadone. 6.prominent supraclavicular fat pads, but no supraclavicular adenopathy that I can appreciate. CXR 04-2016 nothing in this regard.  7.recent back injury lifting garage door, improving with conservative interventions by PCP. 8.flu vaccine 07-04-16 9. Post reduction mammoplasty right breast  All questions answered and patient is pleased with plans as above. Time spent 20 min including >50% counseling and coordination of care. Route PCP, cc Dr Benson Norway   Evlyn Clines, MD   10/02/2016, 1:53 PM

## 2016-10-02 ENCOUNTER — Encounter: Payer: Self-pay | Admitting: Oncology

## 2016-10-02 ENCOUNTER — Other Ambulatory Visit: Payer: Medicare Other

## 2016-10-02 ENCOUNTER — Encounter: Payer: Medicare Other | Admitting: Genetic Counselor

## 2016-10-04 ENCOUNTER — Telehealth: Payer: Self-pay | Admitting: Genetic Counselor

## 2016-10-04 ENCOUNTER — Other Ambulatory Visit: Payer: Medicare Other

## 2016-10-04 ENCOUNTER — Ambulatory Visit
Admission: RE | Admit: 2016-10-04 | Discharge: 2016-10-04 | Disposition: A | Discharge status: Home / Self Care | Payer: Medicare Other | Source: Ambulatory Visit | Attending: Oncology | Admitting: Oncology

## 2016-10-04 DIAGNOSIS — Z853 Personal history of malignant neoplasm of breast: Secondary | ICD-10-CM

## 2016-10-04 DIAGNOSIS — M7989 Other specified soft tissue disorders: Secondary | ICD-10-CM

## 2016-10-04 DIAGNOSIS — N644 Mastodynia: Secondary | ICD-10-CM

## 2016-10-04 NOTE — Telephone Encounter (Signed)
Left a vm to reschedule genetics appt.

## 2016-10-06 ENCOUNTER — Encounter: Payer: Self-pay | Admitting: Oncology

## 2016-10-06 NOTE — Progress Notes (Signed)
Medical Oncology  Reports of mammogram and [place] reviewed, no findings of concern. Radiologist spoke with patient re findings per those reports. Reports forwarded to her PCP.  Godfrey Pick, MD

## 2016-10-07 ENCOUNTER — Telehealth: Payer: Self-pay | Admitting: Genetic Counselor

## 2016-10-07 NOTE — Telephone Encounter (Signed)
Lt vm to rescheduled appt from 1/17 due to inclement weather

## 2016-10-08 ENCOUNTER — Telehealth: Payer: Self-pay | Admitting: Genetic Counselor

## 2016-10-08 NOTE — Telephone Encounter (Signed)
Pt returned call and confirmed appt, verified demo and insurance

## 2016-10-11 ENCOUNTER — Ambulatory Visit (HOSPITAL_BASED_OUTPATIENT_CLINIC_OR_DEPARTMENT_OTHER): Payer: Medicare Other

## 2016-10-11 ENCOUNTER — Other Ambulatory Visit: Payer: Medicare Other

## 2016-10-11 DIAGNOSIS — Z8042 Family history of malignant neoplasm of prostate: Secondary | ICD-10-CM

## 2016-10-11 DIAGNOSIS — Z809 Family history of malignant neoplasm, unspecified: Secondary | ICD-10-CM

## 2016-10-11 DIAGNOSIS — Z853 Personal history of malignant neoplasm of breast: Secondary | ICD-10-CM

## 2016-10-12 NOTE — Progress Notes (Signed)
REFERRING PROVIDER: Nolene Ebbs, MD [place],  [id number]  PRIMARY PROVIDER:  Philis Fendt, MD  PRIMARY REASON FOR VISIT:  1. History of breast cancer   2. Family history of prostate cancer   3. Paternal family history of cancer      HISTORY OF PRESENT ILLNESS:   Elaine Simon, a 66 y.o. female, was seen for a [place] cancer genetics consultation at the request of Dr. Jeanie Cooks due to a personal and family history of cancer.  Elaine Simon presents to clinic today to discuss the possibility of a hereditary predisposition to cancer, genetic testing, and to further clarify her future cancer risks, as well as potential cancer risks for family members.   In 1995, at the age of 51, Elaine Simon was diagnosed with invasive breast cancer of the left breast. This was treated with lumpectomy, chemotherapy and radiation.   HORMONAL RISK FACTORS:  Ovaries intact: yes.  Hysterectomy: no.  Menopausal status: postmenopausal.  HRT use: not assessed. Colonoscopy: yes; normal: benign polyps. Mammogram within the last year: yes. Number of breast biopsies: not assessed. Up to date with pelvic exams:  n/a. Any excessive radiation exposure in the past:  n/a  Past Medical History:  Diagnosis Date  . Anxiety   . Breast cancer ([place]) 1995  . Cataract immature    bilat.  . Depression   . Hay fever   . Hx of mitral valve prolapse    has had no problems  . Hyperlipidemia   . Hypertension   . Hypothyroidism   . Insomnia   . Osteoarthritis   . Panic attacks   . Snores    states occ. apnea, occ. wakes up coughing; has never had sleep study;  denies daytime sleepiness    Past Surgical History:  Procedure Laterality Date  . BREAST LUMPECTOMY  1995   left  . BREAST RECONSTRUCTION  04/01/2012   Procedure: BREAST RECONSTRUCTION;  Surgeon: Theodoro Kos, DO;  Location: [facility];  Service: Plastics;  Laterality: Right;  Right breast mastopexy  reduction   . GANGLION CYST EXCISION    . TONSILLECTOMY    . UTERINE FIBROID SURGERY      Social History   Social History  . Marital status: Single    Spouse name: N/A  . Number of children: N/A  . Years of education: N/A   Social History Main Topics  . Smoking status: Never Smoker  . Smokeless tobacco: Never Used  . Alcohol use Yes     Comment: occasionally  . Drug use: No  . Sexual activity: Not Currently   Other Topics Concern  . Not on file   Social History Narrative  . No narrative on file     FAMILY HISTORY:  We obtained a detailed, 4-generation family history.  Significant diagnoses are listed below: Family History  Problem Relation Age of Onset  . Heart disease Other   . Diabetes Other   . Cancer Father 58    prostate  . Cancer Brother 48    prostate  . Cancer Maternal Aunt 60    kidney  . Cancer Maternal Grandfather 75    stomach    Elaine Simon is unaware of previous family history of genetic testing for hereditary cancer risks. Patient's maternal ancestors are of African-American and Native American descent, and paternal ancestors are of African-American and Native American descent. There is no reported Ashkenazi Jewish ancestry.   GENETIC COUNSELING ASSESSMENT: Elaine Simon is a  66 y.o. female with a personal and family history which is somewhat suggestive of a hereditary predisposition to cancer. We, therefore, discussed and recommended the following at today's visit.   DISCUSSION: We reviewed the characteristics, features and inheritance patterns of hereditary cancer syndromes. We also discussed genetic testing, including the appropriate family members to test, the process of testing, insurance coverage and turn-around-time for results. We discussed the implications of a negative, positive and/or variant of uncertain significant result. We recommended Elaine Simon pursue genetic testing for the comprehensive cancer gene panel.   Based  on Elaine Simon's personal and family history of cancer, she meets medical criteria for genetic testing. Despite that she meets criteria, she may still have an out of pocket cost. We discussed that if her out of pocket cost for testing is over $100, the laboratory will call and confirm whether she wants to proceed with testing.  If the out of pocket cost of testing is less than $100 she will be billed by the genetic testing laboratory.   We reviewed the characteristics, features and inheritance patterns of hereditary cancer syndromes. We also discussed genetic testing, including the appropriate family members to test, the process of testing, insurance coverage and turn-around-time for results. We discussed the implications of a negative, positive and/or variant of uncertain significant result.    PLAN: After considering the risks, benefits, and limitations, Elaine Simon  provided informed consent to pursue genetic testing and the blood sample was sent to [facility] for analysis of the comprehensive common cancer panel test. Results should be available within approximately 2 weeks' time, at which point they will be disclosed by telephone to Elaine Simon, as will any additional recommendations warranted by these results. Elaine Simon will receive a summary of her genetic counseling visit and a copy of her results once available. This information will also be available in Epic. We encouraged Elaine Simon to remain in contact with cancer genetics annually so that we can continuously update the family history and inform her of any changes in cancer genetics and testing that may be of benefit for her family. Elaine Simon questions were answered to her satisfaction today. Our contact information was provided should additional questions or concerns arise.  Ms.  Simon questions were answered to her satisfaction today. Our contact information was provided should additional  questions or concerns arise. Thank you for the referral and allowing [place] to share in the care of your patient.   Benay Pike, MS, [facility] Certified Genetic Counselor  The patient was seen for a total of approximately 45 minutes in face-to-face genetic counseling.  This patient was discussed with Drs. Magrinat, Lindi Adie and/or Burr Medico who agrees with the above.    _______________________________________________________________________ For Office Staff:  Number of people involved in session: 2 Was an Intern/ student involved with case: no

## 2016-10-25 ENCOUNTER — Ambulatory Visit: Payer: Self-pay | Admitting: Genetic Counselor

## 2016-10-25 ENCOUNTER — Encounter: Payer: Self-pay | Admitting: Genetic Counselor

## 2016-10-25 ENCOUNTER — Telehealth: Payer: Self-pay | Admitting: Genetic Counselor

## 2016-10-25 DIAGNOSIS — Z853 Personal history of malignant neoplasm of breast: Secondary | ICD-10-CM

## 2016-10-25 DIAGNOSIS — Z8042 Family history of malignant neoplasm of prostate: Secondary | ICD-10-CM

## 2016-10-25 DIAGNOSIS — Z1379 Encounter for other screening for genetic and chromosomal anomalies: Secondary | ICD-10-CM | POA: Insufficient documentation

## 2016-10-25 NOTE — Progress Notes (Signed)
[facility]    Patient Name: Elaine Simon Patient DOB: [birth year] Patient Age: 66 y.o. Encounter Date: 10/25/2016  Referring Provider: Evlyn Clines, MD  Primary Care Provider: Philis Fendt, MD  Elaine Simon was called today to discuss genetic test results. Please see the Genetics note from her visit on 10/11/16 for a detailed discussion of her personal and family history.  Genetic Testing: At the time of Elaine Simon's visit, we recommended she pursue genetic testing of multiple genes associated with a hereditary predisposition to cancer via the Comprehensive Cancer Panel offered by [facility] which included sequencing and/or deletion duplication testing of the following 46 genes: APC, ATM, AXIN2, BAP1, BARD1, BMPR1A, BRCA1, BRCA2, BRIP1, CDH1, CDK4, CDKN2A, CHEK2, EPCAM, FANCC, FH, FLCN, HOXB13, MET, MITF,  MLH1, MSH2, MSH6, MUTYH, NBN, NF1, NTHL1,  PALB2, PMS2, POLD1, POLE, POT1, PTEN, RAD51C, RAD51D, RECQL, SCG5/GREM1, SDHB, SDHC, SDHD, SMAD4, STK11, TP53, TSC1, TSC2, and VHL.  [facility] Kitchen Testing did not reveal a clinically actionable mutation in these genes. A copy of the genetic test report will be scanned into Epic under the media tab.  Since the current test is not perfect, it is possible there may be a gene mutation that current testing cannot detect, but that chance is small. We also discussed that it is possible that a different genetic factor, which was not part of this testing or has not yet been discovered, is responsible for the cancer diagnoses in the family. Again, the likelihood of this is low. No additional testing is recommended at this time.   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 At this time, it is unknown if this finding is associated with increased cancer risk, but most variants get reclassified to being inconsequential. This finding should not be used to make  medical management decisions. With time, we suspect the lab will determine the significance of it, if any. If we do learn more about it, we will try to contact Elaine Simon to discuss it further. However, it is important to stay in touch with [place] periodically and keep the address and phone number up to date.    Cancer Screening: Given the personal and family histories, we must interpret these negative results with some caution. Families with features suggestive of hereditary risk tend to have multiple family members with cancer, diagnoses in multiple generations and diagnoses before the age of 37. Elaine Simon family exhibits some of these features. This result may simply reflect our current inability to detect all mutations within these genes. It is also possible there is a different gene, that we have not yet tested, that may be responsible for the cancer in this family.   We recommend that Elaine Simon discuss her cancer screening options with her provider.  Family Members: Family members are at some increased risk of developing cancer, over the general population risk, simply due to the family history. We recommended women have a yearly mammogram beginning aroundage 35, a yearly clinical breast exam, and perform monthly breast self-exams. A gynecologic exam is recommended yearly. Colon cancer screening is recommended to begin by age 65 for men and women.  Any relative who had cancer at a young age or had a particularly rare cancer may also wish to pursue genetic testing. Genetic counselors can be located in other cities, by visiting the website of the [employer] (ArtistMovie.se) and Field seismologist for  a Dietitian by zip code. Family members should not get tested for the above VUS outside of a research protocol as this finding has no implications for their medical management.  Lastly, cancer genetics is a rapidly advancing field and it is possible that new  genetic tests will be appropriate for her in the future. We encourage her to remain in contact with [place] on an annual basis so we can update her personal and family histories, and let her know of advances in cancer genetics that may benefit the family. Our contact number was provided. Elaine Simon is welcome to call anytime with additional questions.    Marylouise Stacks, MS,  [facility] Certified Genetic Counselor phone: [phone] [email or web address]

## 2016-10-25 NOTE — Telephone Encounter (Signed)
Patient called back to discuss results.

## 2016-11-06 ENCOUNTER — Other Ambulatory Visit: Payer: Self-pay

## 2016-11-06 DIAGNOSIS — Z853 Personal history of malignant neoplasm of breast: Secondary | ICD-10-CM

## 2016-11-07 ENCOUNTER — Ambulatory Visit (HOSPITAL_BASED_OUTPATIENT_CLINIC_OR_DEPARTMENT_OTHER): Payer: Medicare Other | Admitting: Hematology and Oncology

## 2016-11-07 ENCOUNTER — Other Ambulatory Visit: Payer: Medicare Other

## 2016-11-07 ENCOUNTER — Ambulatory Visit (HOSPITAL_BASED_OUTPATIENT_CLINIC_OR_DEPARTMENT_OTHER): Payer: Medicare Other

## 2016-11-07 VITALS — BP 129/79 | HR 92 | Temp 98.1°F | Resp 18 | Wt 187.4 lb

## 2016-11-07 DIAGNOSIS — Z780 Asymptomatic menopausal state: Secondary | ICD-10-CM | POA: Insufficient documentation

## 2016-11-07 DIAGNOSIS — Z853 Personal history of malignant neoplasm of breast: Secondary | ICD-10-CM

## 2016-11-07 DIAGNOSIS — M859 Disorder of bone density and structure, unspecified: Secondary | ICD-10-CM

## 2016-11-07 LAB — CBC WITH DIFFERENTIAL/PLATELET
BASO%: 0.2 % (ref 0.0–2.0)
Basophils Absolute: 0 10*3/uL (ref 0.0–0.1)
EOS%: 1.2 % (ref 0.0–7.0)
Eosinophils Absolute: 0.1 10*3/uL (ref 0.0–0.5)
HCT: 38.2 % (ref 34.8–46.6)
HGB: 12.9 g/dL (ref 11.6–15.9)
LYMPH%: 44.6 % (ref 14.0–49.7)
MCH: 29.8 pg (ref 25.1–34.0)
MCHC: 33.8 g/dL (ref 31.5–36.0)
MCV: 88.2 fL (ref 79.5–101.0)
MONO#: 0.3 10*3/uL (ref 0.1–0.9)
MONO%: 4.5 % (ref 0.0–14.0)
NEUT#: 2.8 10*3/uL (ref 1.5–6.5)
NEUT%: 49.5 % (ref 38.4–76.8)
Platelets: 297 10*3/uL (ref 145–400)
RBC: 4.33 10*6/uL (ref 3.70–5.45)
RDW: 13.1 % (ref 11.2–14.5)
WBC: 5.7 10*3/uL (ref 3.9–10.3)
lymph#: 2.6 10*3/uL (ref 0.9–3.3)

## 2016-11-07 LAB — COMPREHENSIVE METABOLIC PANEL
ALT: 29 U/L (ref 0–55)
AST: 23 U/L (ref 5–34)
Albumin: 4.6 g/dL (ref 3.5–5.0)
Alkaline Phosphatase: 71 U/L (ref 40–150)
Anion Gap: 12 mEq/L — ABNORMAL HIGH (ref 3–11)
BUN: 12.6 mg/dL (ref 7.0–26.0)
CO2: 25 mEq/L (ref 22–29)
Calcium: 10.3 mg/dL (ref 8.4–10.4)
Chloride: 100 mEq/L (ref 98–109)
Creatinine: 1.1 mg/dL (ref 0.6–1.1)
EGFR: 60 mL/min/{1.73_m2} — ABNORMAL LOW (ref >90)
Glucose: 103 mg/dl (ref 70–140)
Potassium: 4 mEq/L (ref 3.5–5.1)
Sodium: 136 mEq/L (ref 136–145)
Total Bilirubin: 0.31 mg/dL (ref 0.20–1.20)
Total Protein: 8.4 g/dL — ABNORMAL HIGH (ref 6.4–8.3)

## 2016-11-07 NOTE — Progress Notes (Signed)
Patient Care Team: Nolene Ebbs, MD as PCP - General (Internal Medicine) Carol Ada, MD as Consulting Physician (Gastroenterology) Foye Spurling, MD as Consulting Physician (Internal Medicine) Ena Dawley, MD as Consulting Physician (Obstetrics and Gynecology)  DIAGNOSIS:  Encounter Diagnoses  Name Primary?  . Post-menopausal Yes  . Estrogen deficiency   . NEOPLASM, MALIGNANT, BREAST, HX OF     CHIEF COMPLIANT: Follow-up of recent ultrasound and mammogram  INTERVAL HISTORY: Elaine Simon is a 66 year old with above-mentioned history of stage I breast cancer treated at [facility] with lumpectomy followed by adjuvant chemotherapy and radiation. She refuses antiestrogen therapy at that time. She has been in remission for the past 23 years. She was seen by Dr. Marko Plume who retired and so she is still here to see me. She reports that the swelling and discomfort have improved. She is very grateful that she does not have lymphedema.  REVIEW OF SYSTEMS:   Constitutional: Denies fevers, chills or abnormal weight loss Eyes: Denies blurriness of vision Ears, nose, mouth, throat, and face: Denies mucositis or sore throat Respiratory: Denies cough, dyspnea or wheezes Cardiovascular: Denies palpitation, chest discomfort Gastrointestinal:  Denies nausea, heartburn or change in bowel habits Skin: Denies abnormal skin rashes Lymphatics: Denies new lymphadenopathy or easy bruising Neurological:Denies numbness, tingling or new weaknesses Behavioral/Psych: Anxious about breast cancer recurrence  Extremities: No lower extremity edema Breast: Mild left breast discomfort and axillary discomfort All other systems were reviewed with the patient and are negative.  I have reviewed the past medical history, past surgical history, social history and family history with the patient and they are unchanged from previous note.  ALLERGIES:  is allergic to adhesive [tape] and codeine.  MEDICATIONS:    Current Outpatient Prescriptions  Medication Sig Dispense Refill  . ALPRAZolam (XANAX) 1 MG tablet Take 1 mg by mouth as needed.    . AMBULATORY NON FORMULARY MEDICATION Medication Name: Boric Acid  Insert 1 Capsule into the vagina twice each week. 24 capsule 12  . calcium carbonate (OS-CAL) 600 MG TABS Take 600 mg by mouth 2 (two) times daily with a meal.    . cholecalciferol (VITAMIN D) 1000 UNITS tablet Take 1,000 Units by mouth daily.    . diclofenac (VOLTAREN) 75 MG EC tablet Take 1 tablet (75 mg total) by mouth 2 (two) times daily. 60 tablet 0  . docusate sodium (COLACE) 100 MG capsule Take 100 mg by mouth daily as needed for mild constipation.    . fish oil-omega-3 fatty acids 1000 MG capsule Take 2 g by mouth daily.    . hydrochlorothiazide (MICROZIDE) 12.5 MG capsule Take 12.5 mg by mouth daily.     . pravastatin (PRAVACHOL) 40 MG tablet Take 40 mg by mouth daily. PM    . sertraline (ZOLOFT) 50 MG tablet     . traZODone (DESYREL) 50 MG tablet Take 50 mg by mouth at bedtime.    . Wheat Dextrin (BENEFIBER PO) Take 1 Dose by mouth daily.     No current facility-administered medications for this visit.     PHYSICAL EXAMINATION: ECOG PERFORMANCE STATUS: 1 - Symptomatic but completely ambulatory  Vitals:   11/07/16 1400  BP: 129/79  Pulse: 92  Resp: 18  Temp: 98.1 F (36.7 C)   Filed Weights   11/07/16 1400  Weight: 187 lb 6.4 oz (85 kg)    GENERAL:alert, no distress and comfortable SKIN: skin color, texture, turgor are normal, no rashes or significant lesions EYES: normal, Conjunctiva are pink  and non-injected, sclera clear OROPHARYNX:no exudate, no erythema and lips, buccal mucosa, and tongue normal  NECK: supple, thyroid normal size, non-tender, without nodularity LYMPH:  no palpable lymphadenopathy in the cervical, axillary or inguinal LUNGS: clear to auscultation and percussion with normal breathing effort HEART: regular rate & rhythm and no murmurs and no lower  extremity edema ABDOMEN:abdomen soft, non-tender and normal bowel sounds MUSCULOSKELETAL:no cyanosis of digits and no clubbing  NEURO: alert & oriented x 3 with fluent speech, no focal motor/sensory deficits EXTREMITIES: No lower extremity edema  LABORATORY DATA:  I have reviewed the data as listed   Chemistry      Component Value Date/Time   NA 136 11/07/2016 1529   K 4.0 11/07/2016 1529   CL 101 05/03/2016 1743   CL 102 02/15/2013 1101   CO2 25 11/07/2016 1529   BUN 12.6 11/07/2016 1529   CREATININE 1.1 11/07/2016 1529      Component Value Date/Time   CALCIUM 10.3 11/07/2016 1529   ALKPHOS 71 11/07/2016 1529   AST 23 11/07/2016 1529   ALT 29 11/07/2016 1529   BILITOT 0.31 11/07/2016 1529       Lab Results  Component Value Date   WBC 5.7 11/07/2016   HGB 12.9 11/07/2016   HCT 38.2 11/07/2016   MCV 88.2 11/07/2016   PLT 297 11/07/2016   NEUTROABS 2.8 11/07/2016    ASSESSMENT & PLAN:  NEOPLASM, MALIGNANT, BREAST, HX OF Stage 1 left breast cancer 1995 treated with lumpectomy and 19 node left axillary dissection, chemotherapy (possibly 6 cycles adriamycin combination) and radiation. Declined tamoxifen, was on Evista discontinued 8 years ago  Left axillary and left supraclavicular swelling: Ultrasound and mammogram were done recently and they were negative.  I discussed with her the results of the mammogram and ultrasound. Patient is very anxious  I discussed with her that there is no role of tumor markers. I encouraged her to exercise daily and to watch her diet and take care of her health.  Osteopenia: Last bone density 2016 showed a T score of -2.2, previously 2013 it was -3.3. She is currently not taking any Evista. I recommended obtaining another bone density test. I will call her with the result of this test. If she does have significant osteoporosis, we may have to start her on a bisphosphonate therapy. If she has mild osteopenia or osteoporosis, she could go  back to Evista. Return to clinic in 1 year for follow-up.    I spent 25 minutes talking to the patient of which more than half was spent in counseling and coordination of care.  Orders Placed This Encounter  Procedures  . DG Bone Density    Wt-187/pf 09/23/14 bcg/no calcium/no needs [facility] [id number] Epic order    Standing Status:   Future    Standing Expiration Date:   11/07/2017    Order Specific Question:   Reason for Exam (SYMPTOM  OR DIAGNOSIS REQUIRED)    Answer:   osteopenia    Order Specific Question:   Preferred imaging location?    Answer:   [facility]  . CBC with Differential    Standing Status:   Future    Number of Occurrences:   1    Standing Expiration Date:   11/07/2017  . Comprehensive metabolic panel    Standing Status:   Future    Number of Occurrences:   1    Standing Expiration Date:   11/07/2017  . Vitamin D 25  hydroxy    Standing Status:   Future    Number of Occurrences:   1    Standing Expiration Date:   11/07/2017   The patient has a good understanding of the overall plan. she agrees with it. she will call with any problems that may develop before the next visit here.   Rulon Eisenmenger, MD 11/07/16

## 2016-11-07 NOTE — Assessment & Plan Note (Signed)
Stage 1 left breast cancer 1995 treated with lumpectomy and 19 node left axillary dissection, chemotherapy (possibly 6 cycles adriamycin combination) and radiation. Declined tamoxifen, was on Evista discontinued 8 years ago  Left axillary and left supraclavicular swelling: Ultrasound and mammogram were done recently and they were negative.  I discussed with her the results of the mammogram and ultrasound. Patient is very anxious  I discussed with her that there is no role of tumor markers. I encouraged her to exercise daily and to watch her diet and take care of her health.  Osteopenia: Last bone density 2016 showed a T score of -2.2, previously 2013 it was -3.3. She is currently not taking any Evista. I recommended obtaining another bone density test. I will call her with the result of this test.  Return to clinic in 1 year for follow-up.

## 2016-11-08 LAB — VITAMIN D 25 HYDROXY (VIT D DEFICIENCY, FRACTURES): Vitamin D, 25-Hydroxy: 42.7 ng/mL (ref 30.0–100.0)

## 2016-11-13 ENCOUNTER — Ambulatory Visit
Admission: RE | Admit: 2016-11-13 | Discharge: 2016-11-13 | Disposition: A | Discharge status: Home / Self Care | Payer: Medicare Other | Source: Ambulatory Visit | Attending: Hematology and Oncology | Admitting: Hematology and Oncology

## 2016-11-13 DIAGNOSIS — E2839 Other primary ovarian failure: Secondary | ICD-10-CM

## 2016-11-13 DIAGNOSIS — Z780 Asymptomatic menopausal state: Secondary | ICD-10-CM

## 2016-12-19 ENCOUNTER — Other Ambulatory Visit (HOSPITAL_COMMUNITY): Payer: Self-pay | Admitting: Internal Medicine

## 2016-12-19 ENCOUNTER — Ambulatory Visit (HOSPITAL_COMMUNITY)
Admission: RE | Admit: 2016-12-19 | Discharge: 2016-12-19 | Disposition: A | Discharge status: Home / Self Care | Payer: Medicare Other | Source: Ambulatory Visit | Attending: Internal Medicine | Admitting: Internal Medicine

## 2016-12-19 DIAGNOSIS — M79605 Pain in left leg: Secondary | ICD-10-CM | POA: Diagnosis not present

## 2016-12-19 DIAGNOSIS — R52 Pain, unspecified: Secondary | ICD-10-CM

## 2016-12-19 NOTE — Progress Notes (Signed)
**  Preliminary report by tech**  Left lower extremity venous duplex complete. There is no evidence of deep or superficial vein thrombosis involving the left lower extremity. All visualized vessels appear patent and compressible. There is no evidence of a Baker cyst on the left. Results were faxed to Nolene Ebbs.  12/19/16 5:12 PM Elaine Simon RVT

## 2017-05-13 ENCOUNTER — Ambulatory Visit
Admission: RE | Admit: 2017-05-13 | Discharge: 2017-05-13 | Disposition: A | Discharge status: Home / Self Care | Payer: Medicare Other | Source: Ambulatory Visit | Attending: Hematology and Oncology | Admitting: Hematology and Oncology

## 2017-05-13 ENCOUNTER — Telehealth: Payer: Self-pay | Admitting: Genetics

## 2017-05-13 ENCOUNTER — Other Ambulatory Visit: Payer: Self-pay | Admitting: Hematology and Oncology

## 2017-05-13 ENCOUNTER — Encounter: Payer: Self-pay | Admitting: Hematology and Oncology

## 2017-05-13 ENCOUNTER — Encounter (INDEPENDENT_AMBULATORY_CARE_PROVIDER_SITE_OTHER): Payer: Self-pay

## 2017-05-13 ENCOUNTER — Ambulatory Visit (HOSPITAL_BASED_OUTPATIENT_CLINIC_OR_DEPARTMENT_OTHER): Payer: Medicare Other | Admitting: Hematology and Oncology

## 2017-05-13 DIAGNOSIS — Z853 Personal history of malignant neoplasm of breast: Secondary | ICD-10-CM

## 2017-05-13 DIAGNOSIS — M8580 Other specified disorders of bone density and structure, unspecified site: Secondary | ICD-10-CM

## 2017-05-13 HISTORY — DX: Personal history of irradiation: Z92.3

## 2017-05-13 NOTE — Progress Notes (Signed)
Patient Care Team: Nolene Ebbs, MD as PCP - General (Internal Medicine) Carol Ada, MD as Consulting Physician (Gastroenterology) Foye Spurling, MD as Consulting Physician (Internal Medicine) Ena Dawley, MD as Consulting Physician (Obstetrics and Gynecology)  DIAGNOSIS:  Encounter Diagnosis  Name Primary?  . NEOPLASM, MALIGNANT, BREAST, HX OF    CHIEF COMPLIANT: Surveillance of breast cancer  INTERVAL HISTORY: Elaine Simon is a 66 year old lady with a history of breast cancer previously seen and treated by Dr. Marko Plume who is here for her annual follow-up. She was diagnosed in 1995 and treated with surgery chemotherapy radiation and followed by antiestrogen therapy with Raloxifene until 2010. She is currently on surveillance. She felt a lump in the left breast since Friday. There was no pain associated with it.    REVIEW OF SYSTEMS:   Constitutional: Denies fevers, chills or abnormal weight loss Eyes: Denies blurriness of vision Ears, nose, mouth, throat, and face: Denies mucositis or sore throat Respiratory: Denies cough, dyspnea or wheezes Cardiovascular: Denies palpitation, chest discomfort Gastrointestinal:  Denies nausea, heartburn or change in bowel habits Skin: Denies abnormal skin rashes Lymphatics: Denies new lymphadenopathy or easy bruising Neurological:Denies numbness, tingling or new weaknesses Behavioral/Psych: Mood is stable, no new changes  Extremities: No lower extremity edema Breast: Felt a lump in the left breast All other systems were reviewed with the patient and are negative.  I have reviewed the past medical history, past surgical history, social history and family history with the patient and they are unchanged from previous note.  ALLERGIES:  is allergic to adhesive [tape] and codeine.  MEDICATIONS:  Current Outpatient Prescriptions  Medication Sig Dispense Refill  . ALPRAZolam (XANAX) 1 MG tablet Take 1 mg by mouth as needed.      . AMBULATORY NON FORMULARY MEDICATION Medication Name: Boric Acid  Insert 1 Capsule into the vagina twice each week. 24 capsule 12  . calcium carbonate (OS-CAL) 600 MG TABS Take 600 mg by mouth 2 (two) times daily with a meal.    . cholecalciferol (VITAMIN D) 1000 UNITS tablet Take 1,000 Units by mouth daily.    . diclofenac (VOLTAREN) 75 MG EC tablet Take 1 tablet (75 mg total) by mouth 2 (two) times daily. 60 tablet 0  . docusate sodium (COLACE) 100 MG capsule Take 100 mg by mouth daily as needed for mild constipation.    . fish oil-omega-3 fatty acids 1000 MG capsule Take 2 g by mouth daily.    . hydrochlorothiazide (MICROZIDE) 12.5 MG capsule Take 12.5 mg by mouth daily.     . pravastatin (PRAVACHOL) 40 MG tablet Take 40 mg by mouth daily. PM    . sertraline (ZOLOFT) 50 MG tablet     . traZODone (DESYREL) 50 MG tablet Take 50 mg by mouth at bedtime.    . Wheat Dextrin (BENEFIBER PO) Take 1 Dose by mouth daily.     No current facility-administered medications for this visit.     PHYSICAL EXAMINATION: ECOG PERFORMANCE STATUS: 1 - Symptomatic but completely ambulatory  Vitals:   05/13/17 1017  BP: 111/69  Pulse: 87  Resp: 18  Temp: 98.4 F (36.9 C)  SpO2: 97%   Filed Weights   05/13/17 1017  Weight: 192 lb 4.8 oz (87.2 kg)    GENERAL:alert, no distress and comfortable SKIN: skin color, texture, turgor are normal, no rashes or significant lesions EYES: normal, Conjunctiva are pink and non-injected, sclera clear OROPHARYNX:no exudate, no erythema and lips, buccal mucosa, and tongue normal  NECK: supple, thyroid normal size, non-tender, without nodularity LYMPH:  no palpable lymphadenopathy in the cervical, axillary or inguinal LUNGS: clear to auscultation and percussion with normal breathing effort HEART: regular rate & rhythm and no murmurs and no lower extremity edema ABDOMEN:abdomen soft, non-tender and normal bowel sounds MUSCULOSKELETAL:no cyanosis of digits and no  clubbing  NEURO: alert & oriented x 3 with fluent speech, no focal motor/sensory deficits EXTREMITIES: No lower extremity edema BREAST: Palpable lump in left breast. No palpable axillary supraclavicular or infraclavicular adenopathy no breast tenderness or nipple discharge. (exam performed in the presence of a chaperone)  LABORATORY DATA:  I have reviewed the data as listed   Chemistry      Component Value Date/Time   NA 136 11/07/2016 1529   K 4.0 11/07/2016 1529   CL 101 05/03/2016 1743   CL 102 02/15/2013 1101   CO2 25 11/07/2016 1529   BUN 12.6 11/07/2016 1529   CREATININE 1.1 11/07/2016 1529      Component Value Date/Time   CALCIUM 10.3 11/07/2016 1529   ALKPHOS 71 11/07/2016 1529   AST 23 11/07/2016 1529   ALT 29 11/07/2016 1529   BILITOT 0.31 11/07/2016 1529       Lab Results  Component Value Date   WBC 5.7 11/07/2016   HGB 12.9 11/07/2016   HCT 38.2 11/07/2016   MCV 88.2 11/07/2016   PLT 297 11/07/2016   NEUTROABS 2.8 11/07/2016    ASSESSMENT & PLAN:  NEOPLASM, MALIGNANT, BREAST, HX OF Stage 1 left breast cancer 1995 treated with lumpectomy and 19 node left axillary dissection, chemotherapy (possibly 6 cycles adriamycin combination) and radiation. Declined tamoxifen, was on Evista discontinued 2010  Left axillary and left supraclavicular swelling: Ultrasound and mammogram were done 10/04/2016 and they were negative.  Osteopenia: Last bone density 2016 showed a T score of -2.2, previously 2013 it was -3.3. She is currently not taking any Evista. I recommended obtaining another bone density test. Most recent bone density test on 11/13/2016 revealed a T score of minus 1.2. This is remarkable improvement from before. She does not need Evista.  Palpable lump in the left breast: Patient is felt this for the past week. It is not associated with any pain. This is freely mobile 3 cm nodule. Will request an urgent mammogram and ultrasound and biopsy of this lesion. We  will await the results of pathology report.  I spent 25 minutes talking to the patient of which more than half was spent in counseling and coordination of care.  Orders Placed This Encounter  Procedures  . MM DIAG BREAST TOMO UNI LEFT    Standing Status:   Future    Standing Expiration Date:   05/13/2018    Order Specific Question:   Reason for Exam (SYMPTOM  OR DIAGNOSIS REQUIRED)    Answer:   Lump in left breast    Order Specific Question:   Preferred imaging location?    Answer:   [facility]  . US BREAST LTD UNI LEFT INC AXILLA    Standing Status:   Future    Standing Expiration Date:   07/13/2018    Order Specific Question:   Reason for Exam (SYMPTOM  OR DIAGNOSIS REQUIRED)    Answer:   Lump in left breast for1 week    Order Specific Question:   Preferred imaging location?    Answer:   [facility]   The patient has a good understanding of the overall plan. she agrees with it.  she will call with any problems that may develop before the next visit here.   Rulon Eisenmenger, MD 05/13/17

## 2017-05-13 NOTE — Assessment & Plan Note (Signed)
Stage 1 left breast cancer 1995 treated with lumpectomy and 19 node left axillary dissection, chemotherapy (possibly 6 cycles adriamycin combination) and radiation. Declined tamoxifen, was on Evista discontinued 2010  Left axillary and left supraclavicular swelling: Ultrasound and mammogram were done 10/04/2016 and they were negative.  Osteopenia: Last bone density 2016 showed a T score of -2.2, previously 2013 it was -3.3. She is currently not taking any Evista. I recommended obtaining another bone density test. Most recent bone density test on 11/13/2016 revealed a T score of minus 1.2. This is remarkable improvement from before. She does not need Evista.  Return to playing in one year for follow-up

## 2017-05-13 NOTE — Telephone Encounter (Signed)
Ms. Whatley wanted to review her genetic testing results and asked to discuss what they mean. We discussed the following: 1) Ms. Grow's germline genetic testing, reported out by [facility] on 10/24/2016, did not reveal any pathogenic mutations in any of the 46 genes that were tested. 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 VUSs should not be used to guide Ms. Konitzer's risk assessment or care. We will update her in the future if this variant gets reclassified. 2) There was nothing observed in Ms. Mcinnis's germline genetic testing or family history to suggest that she is at high risk for other cancers. There are no changes to her care that are prompted by her genetic testing or family history. 3) There is no hereditary cancer risk (harmful gene mutation) known to be present in her or her family at this time. 4) Ms. Westervelt requested that a copy of her genetic testing result and genetic counseling notes be mailed to her. This will be mailed today.

## 2017-08-10 ENCOUNTER — Ambulatory Visit (INDEPENDENT_AMBULATORY_CARE_PROVIDER_SITE_OTHER): Payer: Medicare Other

## 2017-08-10 ENCOUNTER — Ambulatory Visit (HOSPITAL_COMMUNITY)
Admission: EM | Admit: 2017-08-10 | Discharge: 2017-08-10 | Disposition: A | Discharge status: Home / Self Care | Payer: Medicare Other | Attending: Family Medicine | Admitting: Family Medicine

## 2017-08-10 ENCOUNTER — Encounter (HOSPITAL_COMMUNITY): Payer: Self-pay | Admitting: *Deleted

## 2017-08-10 DIAGNOSIS — S8992XA Unspecified injury of left lower leg, initial encounter: Secondary | ICD-10-CM

## 2017-08-10 DIAGNOSIS — S8991XA Unspecified injury of right lower leg, initial encounter: Secondary | ICD-10-CM | POA: Diagnosis not present

## 2017-08-10 MED ORDER — MELOXICAM 7.5 MG PO TABS: 7.5000 mg | ORAL_TABLET | Freq: Every day | ORAL | 0 refills | Status: DC

## 2017-08-10 MED ORDER — DICLOFENAC SODIUM 1 % TD GEL: 2.0000 g | Freq: Four times a day (QID) | TRANSDERMAL | 0 refills | Status: AC

## 2017-08-10 NOTE — ED Triage Notes (Signed)
Reports slipping on hard tile floor last night, with bilat knees extending laterally.  Ambulatory with limp.  CMS intact.

## 2017-08-10 NOTE — ED Provider Notes (Signed)
[place]    CSN: [id number] Arrival date & time: 08/10/17  1253     History   Chief Complaint Chief Complaint  Patient presents with  . Knee Injury    HPI Elaine Simon is a 66 y.o. female.   66 year old female comes in for bilateral knee pain after falling last night. States she was carrying clothes when she slipped on something and landed on her knees while both knees were internally rotated. Patient states it took her awhile before she could ambulate, and limped to bed. She took naproxen 220mg  with some improvement. She is unable to tell if there is swelling. Denies numbness/tingling. She states pain is tolerable when nonweightbearing, but is having lots of difficulty with weightbearing.       Past Medical History:  Diagnosis Date  . Anxiety   . Breast cancer ([place]) 1995  . Cataract immature    bilat.  . Depression   . Hay fever   . Hx of mitral valve prolapse    has had no problems  . Hyperlipidemia   . Hypertension   . Hypothyroidism   . Insomnia   . Osteoarthritis   . Panic attacks   . Personal history of radiation therapy 1995  . Snores    states occ. apnea, occ. wakes up coughing; has never had sleep study;  denies daytime sleepiness    Patient Active Problem List   Diagnosis Date Noted  . Post-menopausal 11/07/2016  . Genetic testing 10/25/2016  . Symptomatic mammary hypertrophy 04/01/2012  . NEOPLASM, MALIGNANT, BREAST, HX OF 09/30/2008  . HYPERLIPIDEMIA 09/29/2008  . DEPRESSION 09/29/2008  . HYPERTENSION 09/29/2008  . BACTERIAL VAGINITIS 09/29/2008  . VAGINAL DISCHARGE 09/29/2008  . INSOMNIA 09/29/2008    Past Surgical History:  Procedure Laterality Date  . BREAST LUMPECTOMY Left 1995   left  . BREAST RECONSTRUCTION  04/01/2012   Procedure: BREAST RECONSTRUCTION;  Surgeon: Theodoro Kos, DO;  Location: [place];  Service: Plastics;  Laterality: Right;  Right breast mastopexy reduction   . GANGLION CYST  EXCISION    . LYMPH NODE DISSECTION    . TONSILLECTOMY    . UTERINE FIBROID SURGERY      OB History    No data available       Home Medications    Prior to Admission medications   Medication Sig Start Date End Date Taking? Authorizing Provider  ALPRAZolam Duanne Moron) 1 MG tablet Take 1 mg by mouth as needed.   Yes [provider]  cholecalciferol (VITAMIN D) 1000 UNITS tablet Take 1,000 Units by mouth daily.   Yes [provider]  docusate sodium (COLACE) 100 MG capsule Take 100 mg by mouth daily as needed for mild constipation.   Yes [provider]  hydrochlorothiazide (MICROZIDE) 12.5 MG capsule Take 12.5 mg by mouth daily.  02/07/13  Yes [provider]  pravastatin (PRAVACHOL) 40 MG tablet Take 40 mg by mouth daily. PM   Yes [provider]  sertraline (ZOLOFT) 50 MG tablet  02/02/13  Yes [provider]  traZODone (DESYREL) 50 MG tablet Take 50 mg by mouth at bedtime.   Yes [provider]  diclofenac sodium (VOLTAREN) 1 % GEL Apply 2 g topically 4 (four) times daily. 08/10/17   Tasia Catchings, Avarey Yaeger V, PA-C  meloxicam (MOBIC) 7.5 MG tablet Take 1 tablet (7.5 mg total) by mouth daily. 08/10/17   Ok Edwards, PA-C    Family History Family History  Problem Relation Age of Onset  . Heart disease Other   . Diabetes Other   . Cancer Father 78       prostate  . Cancer Brother 48       prostate  . Cancer Maternal Aunt 60       kidney  . Cancer Maternal Grandfather 50       stomach    Social History Social History   Tobacco Use  . Smoking status: Never Smoker  . Smokeless tobacco: Never Used  Substance Use Topics  . Alcohol use: Yes    Comment: occasionally  . Drug use: No     Allergies   Adhesive [tape] and Codeine   Review of Systems Review of Systems  Reason unable to perform ROS: See HPI as above.     Physical Exam Triage Vital Signs ED Triage Vitals  Enc Vitals Group     BP 08/10/17 1342 (!) 164/84      Pulse Rate 08/10/17 1342 72     Resp 08/10/17 1342 16     Temp 08/10/17 1342 98.3 F (36.8 C)     Temp Source 08/10/17 1342 Oral     SpO2 08/10/17 1342 98 %     Weight --      Height --      Head Circumference --      Peak Flow --      Pain Score 08/10/17 1343 8     Pain Loc --      Pain Edu? --      Excl. in [place]? --    No data found.  Updated Vital Signs BP (!) 164/84   Pulse 72   Temp 98.3 F (36.8 C) (Oral)   Resp 16   SpO2 98%   Physical Exam  Constitutional: She is oriented to person, place, and time. She appears well-developed and well-nourished. No distress.  HENT:  Head: Normocephalic and atraumatic.  Eyes: Conjunctivae are normal. Pupils are equal, round, and reactive to light.  Musculoskeletal:  No obvious swelling/contusions seen. Tenderness along bilateral medial joint lines. Can extend fully, ROM limited due to patient in wheelchair. Tried ambulating patient with difficulty. Sensation intact and equal bilaterally.   Neurological: She is alert and oriented to person, place, and time.     UC Treatments / Results  Labs (all labs ordered are listed, but only abnormal results are displayed) Labs Reviewed - No data to display  EKG  EKG Interpretation None       Radiology Dg Knee Complete 4 Views Left  Result Date: 08/10/2017 CLINICAL DATA:  66 year old female with a history of fall EXAM: LEFT KNEE - COMPLETE 4+ VIEW COMPARISON:  None. FINDINGS: No acute displaced fracture of the femur, patella, tibia. Irregularity at the fibular head with no comparison available. Lateral view demonstrates patella [place]. No joint effusion or significant soft tissue swelling. Mild degenerative changes at the knee. IMPRESSION: Irregularity of the fibular head, potentially chronic changes versus nondisplaced fracture. Recommend correlation with point tenderness at the head of the fibula. No acute fracture of femur or tibia. Patella [place] with no associated joint effusion or soft  tissue swelling. Electronically Signed   By: Corrie Mckusick D.O.   On: 08/10/2017 14:30   Dg Knee Complete 4 Views Right  Result Date: 08/10/2017 CLINICAL DATA:  66 year old female with medial right knee pain after fall last night. EXAM: RIGHT KNEE - COMPLETE 4+ VIEW COMPARISON:  None. FINDINGS: No evidence of fracture, dislocation, or  joint effusion. No evidence of arthropathy or other focal bone abnormality. Soft tissues are unremarkable. IMPRESSION: Negative. Electronically Signed   By: Kristopher Oppenheim M.D.   On: 08/10/2017 14:32    Procedures Procedures (including critical care time)  Medications Ordered in UC Medications - No data to display   Initial Impression / Assessment and Plan / UC Course  I have reviewed the triage vital signs and the nursing notes.  Pertinent labs & imaging results that were available during my care of the patient were reviewed by me and considered in my medical decision making (see chart for details).    Xray negative for fracture/dislocation. NSAIDs and voltaren gel. Ice compress and elevation. Knee sleeve during activity as needed. Follow up with PCP for reevaluation and further treatment needed. Return precautions given.   Final Clinical Impressions(s) / UC Diagnoses   Final diagnoses:  Injury of left knee, initial encounter  Injury of right knee, initial encounter    ED Discharge Orders        Ordered    meloxicam (MOBIC) 7.5 MG tablet  Daily     08/10/17 1449    diclofenac sodium (VOLTAREN) 1 % GEL  4 times daily     08/10/17 1449        Ok Edwards, PA-C 08/10/17 1747

## 2017-08-10 NOTE — Discharge Instructions (Signed)
Xray without fracture/dislocation. Take mobic as directed with food. Voltaren gel on affected site. Ice compress and elevation of knees. Follow up with PCP for reevaluation if symptoms do not improve.

## 2017-10-30 ENCOUNTER — Ambulatory Visit (HOSPITAL_COMMUNITY)
Admission: RE | Admit: 2017-10-30 | Discharge: 2017-10-30 | Disposition: A | Discharge status: Home / Self Care | Payer: Medicare Other | Source: Ambulatory Visit | Attending: Orthopedic Surgery | Admitting: Orthopedic Surgery

## 2017-10-30 ENCOUNTER — Other Ambulatory Visit (HOSPITAL_COMMUNITY): Payer: Self-pay | Admitting: Orthopedic Surgery

## 2017-10-30 DIAGNOSIS — M79604 Pain in right leg: Secondary | ICD-10-CM | POA: Diagnosis present

## 2017-10-30 DIAGNOSIS — M7989 Other specified soft tissue disorders: Secondary | ICD-10-CM | POA: Diagnosis not present

## 2017-10-30 DIAGNOSIS — M79605 Pain in left leg: Secondary | ICD-10-CM | POA: Diagnosis not present

## 2017-10-30 NOTE — Progress Notes (Signed)
Preliminary results by tech - Right Lower Ext. Venous Duplex Completed. Negative for deep and superficial vein thrombosis. Results given to [facility]. Oda Cogan, BS, RDMS, RVT

## 2017-11-04 ENCOUNTER — Other Ambulatory Visit: Payer: Self-pay

## 2017-11-04 DIAGNOSIS — Z853 Personal history of malignant neoplasm of breast: Secondary | ICD-10-CM

## 2017-11-05 ENCOUNTER — Ambulatory Visit: Payer: Medicare Other | Admitting: Hematology and Oncology

## 2017-11-05 ENCOUNTER — Other Ambulatory Visit: Payer: Medicare Other

## 2017-11-06 ENCOUNTER — Telehealth: Payer: Self-pay

## 2017-11-06 NOTE — Telephone Encounter (Signed)
Pt missed appointments on 02/20 due to knee surgery. Rescheduled patient for 02/25 for lab and MD. Pt aware of date/times.  Cyndia Bent RN

## 2017-11-07 ENCOUNTER — Ambulatory Visit: Payer: Medicare Other | Admitting: Hematology and Oncology

## 2017-11-10 ENCOUNTER — Telehealth: Payer: Self-pay | Admitting: Hematology and Oncology

## 2017-11-10 ENCOUNTER — Inpatient Hospital Stay: Payer: Medicare Other

## 2017-11-10 ENCOUNTER — Inpatient Hospital Stay: Payer: Medicare Other | Attending: Hematology and Oncology | Admitting: Hematology and Oncology

## 2017-11-10 DIAGNOSIS — Z79899 Other long term (current) drug therapy: Secondary | ICD-10-CM | POA: Insufficient documentation

## 2017-11-10 DIAGNOSIS — Z853 Personal history of malignant neoplasm of breast: Secondary | ICD-10-CM

## 2017-11-10 DIAGNOSIS — M8580 Other specified disorders of bone density and structure, unspecified site: Secondary | ICD-10-CM | POA: Insufficient documentation

## 2017-11-10 LAB — CBC WITH DIFFERENTIAL (CANCER CENTER ONLY)
Basophils Absolute: 0 10*3/uL (ref 0.0–0.1)
Basophils Relative: 1 %
Eosinophils Absolute: 0.1 10*3/uL (ref 0.0–0.5)
Eosinophils Relative: 1 %
HCT: 40.4 % (ref 34.8–46.6)
Hemoglobin: 13.3 g/dL (ref 11.6–15.9)
Lymphocytes Relative: 37 %
Lymphs Abs: 2.1 10*3/uL (ref 0.9–3.3)
MCH: 30.1 pg (ref 25.1–34.0)
MCHC: 32.9 g/dL (ref 31.5–36.0)
MCV: 91.2 fL (ref 79.5–101.0)
Monocytes Absolute: 0.3 10*3/uL (ref 0.1–0.9)
Monocytes Relative: 6 %
Neutro Abs: 3.1 10*3/uL (ref 1.5–6.5)
Neutrophils Relative %: 55 %
Platelet Count: 300 10*3/uL (ref 145–400)
RBC: 4.43 MIL/uL (ref 3.70–5.45)
RDW: 13.8 % (ref 11.2–14.5)
WBC Count: 5.6 10*3/uL (ref 3.9–10.3)

## 2017-11-10 LAB — CMP (CANCER CENTER ONLY)
ALT: 31 U/L (ref 0–55)
AST: 20 U/L (ref 5–34)
Albumin: 4.1 g/dL (ref 3.5–5.0)
Alkaline Phosphatase: 71 U/L (ref 40–150)
Anion gap: 10 (ref 3–11)
BUN: 21 mg/dL (ref 7–26)
CO2: 24 mmol/L (ref 22–29)
Calcium: 10.5 mg/dL — ABNORMAL HIGH (ref 8.4–10.4)
Chloride: 104 mmol/L (ref 98–109)
Creatinine: 1.17 mg/dL — ABNORMAL HIGH (ref 0.60–1.10)
GFR, Est AFR Am: 55 mL/min — ABNORMAL LOW (ref >60)
GFR, Estimated: 47 mL/min — ABNORMAL LOW (ref >60)
Glucose, Bld: 102 mg/dL (ref 70–140)
Potassium: 4 mmol/L (ref 3.5–5.1)
Sodium: 138 mmol/L (ref 136–145)
Total Bilirubin: 0.3 mg/dL (ref 0.2–1.2)
Total Protein: 8.1 g/dL (ref 6.4–8.3)

## 2017-11-10 NOTE — Assessment & Plan Note (Signed)
Stage 1 left breast cancer 1995 treated with lumpectomy and 19 node left axillary dissection, chemotherapy (possibly 6 cycles adriamycin combination) and radiation. Declined tamoxifen, was on Evista discontinued 2010  Left axillary and left supraclavicular swelling: Ultrasound and mammogram were done 10/04/2016 and they were negative.  Osteopenia: Last bone density 2016 showed a T score of -2.2, previously 2013 it was -3.3. She is currently not taking any Evista. I recommended obtaining another bone density test. Most recent bone density test on 11/13/2016 revealed a T score of minus 1.2. This is remarkable improvement from before. She does not need Evista.  Return to playing in one year for follow-up with survivorship clinic

## 2017-11-10 NOTE — Telephone Encounter (Signed)
Gave avs and calendar for February 2020 °

## 2017-11-10 NOTE — Progress Notes (Signed)
Patient Care Team: Nolene Ebbs, MD as PCP - General (Internal Medicine) Carol Ada, MD as Consulting Physician (Gastroenterology) Foye Spurling, MD as Consulting Physician (Internal Medicine) Ena Dawley, MD as Consulting Physician (Obstetrics and Gynecology)  DIAGNOSIS:  Encounter Diagnosis  Name Primary?  . NEOPLASM, MALIGNANT, BREAST, HX OF    CHIEF COMPLIANT: Surveillance of history of breast cancer  INTERVAL HISTORY: Elaine Simon is a 66 year old with above-mentioned history of breast cancer 25 years ago who is here for annual checkup.  She has had nodularity in the left breast at the site of surgery as well as minimal discomfort in the left axilla.  She had a mammogram and ultrasound in August 2018 which were normal.  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  REVIEW OF SYSTEMS:   Constitutional: Denies fevers, chills or abnormal weight loss Eyes: Denies blurriness of vision Ears, nose, mouth, throat, and face: Denies mucositis or sore throat Respiratory: Denies cough, dyspnea or wheezes Cardiovascular: Denies palpitation, chest discomfort Gastrointestinal:  Denies nausea, heartburn or change in bowel habits Skin: Denies abnormal skin rashes Lymphatics: Denies new lymphadenopathy or easy bruising Neurological:Denies numbness, tingling or new weaknesses Behavioral/Psych: Mood is stable, no new changes  Extremities: No lower extremity edema Breast: Discomfort in the left axilla, slight nodularity at the site of prior surgery. All other systems were reviewed with the patient and are negative.  I have reviewed the past medical history, past surgical history, social history and family history with the patient and they are unchanged from previous note.  ALLERGIES:  is allergic to adhesive [tape] and codeine.  MEDICATIONS:  Current Outpatient Medications  Medication  Sig Dispense Refill  . ALPRAZolam (XANAX) 1 MG tablet Take 1 mg by mouth as needed.    . cholecalciferol (VITAMIN D) 1000 UNITS tablet Take 1,000 Units by mouth daily.    . diclofenac sodium (VOLTAREN) 1 % GEL Apply 2 g topically 4 (four) times daily. 1 Tube 0  . docusate sodium (COLACE) 100 MG capsule Take 100 mg by mouth daily as needed for mild constipation.    . hydrochlorothiazide (MICROZIDE) 12.5 MG capsule Take 12.5 mg by mouth daily.     . meloxicam (MOBIC) 7.5 MG tablet Take 1 tablet (7.5 mg total) by mouth daily. 15 tablet 0  . pravastatin (PRAVACHOL) 40 MG tablet Take 40 mg by mouth daily. PM    . sertraline (ZOLOFT) 50 MG tablet     . traZODone (DESYREL) 50 MG tablet Take 50 mg by mouth at bedtime.     No current facility-administered medications for this visit.     PHYSICAL EXAMINATION: ECOG PERFORMANCE STATUS: 1 - Symptomatic but completely ambulatory  Vitals:   11/10/17 1148  BP: 114/79  Pulse: 83  Resp: 20  Temp: 98.4 F (36.9 C)  SpO2: 96%   Filed Weights   11/10/17 1148  Weight: 194 lb 8 oz (88.2 kg)    GENERAL:alert, no distress and comfortable SKIN: skin color, texture, turgor are normal, no rashes or significant lesions EYES: normal, Conjunctiva are pink and non-injected, sclera clear OROPHARYNX:no exudate, no erythema and lips, buccal mucosa, and tongue normal  NECK: supple, thyroid normal size, non-tender, without nodularity LYMPH:  no palpable lymphadenopathy in the cervical, axillary or inguinal LUNGS: clear to auscultation and percussion with normal breathing effort HEART: regular rate & rhythm and no murmurs and no  lower extremity edema ABDOMEN:abdomen soft, non-tender and normal bowel sounds MUSCULOSKELETAL:no cyanosis of digits and no clubbing  NEURO: alert & oriented x 3 with fluent speech, no focal motor/sensory deficits EXTREMITIES: No lower extremity edema BREAST: Palpable firmness and tenderness in the left breast at 10 to 11 o'clock  position.  This is a site of previous surgery.. (exam performed in the presence of a chaperone)  LABORATORY DATA:  I have reviewed the data as listed CMP Latest Ref Rng & Units 11/10/2017 11/07/2016 09/30/2016  Glucose 70 - 140 mg/dL 102 103 101  BUN 7 - 26 mg/dL 21 12.6 19.1  Creatinine 0.60 - 1.10 mg/dL 1.17(H) 1.1 1.1  Sodium 136 - 145 mmol/L 138 136 137  Potassium 3.5 - 5.1 mmol/L 4.0 4.0 4.0  Chloride 98 - 109 mmol/L 104 - -  CO2 22 - 29 mmol/L 24 25 26   Calcium 8.4 - 10.4 mg/dL 10.5(H) 10.3 10.0  Total Protein 6.4 - 8.3 g/dL 8.1 8.4(H) 7.9  Total Bilirubin 0.2 - 1.2 mg/dL 0.3 0.31 0.33  Alkaline Phos 40 - 150 U/L 71 71 66  AST 5 - 34 U/L 20 23 16   ALT 0 - 55 U/L 31 29 17     Lab Results  Component Value Date   WBC 5.6 11/10/2017   HGB 12.9 11/07/2016   HCT 40.4 11/10/2017   MCV 91.2 11/10/2017   PLT 300 11/10/2017   NEUTROABS 3.1 11/10/2017    ASSESSMENT & PLAN:  NEOPLASM, MALIGNANT, BREAST, HX OF Stage 1 left breast cancer 1995 treated with lumpectomy and 19 node left axillary dissection, chemotherapy (possibly 6 cycles adriamycin combination) and radiation. Declined tamoxifen, was on Evista discontinued 2010  Left axillary and left supraclavicular swelling: Ultrasound and mammogram were done 10/04/2016 and they were negative.  Osteopenia: Last bone density 2016 showed a T score of -2.2, previously 2013 it was -3.3. She is currently not taking any Evista. I recommended obtaining another bone density test. Most recent bone density test on 11/13/2016 revealed a T score of minus 1.2. This is remarkable improvement from before. She does not need Evista.  Return to playing in one year for follow-up with survivorship clinic   I spent 25 minutes talking to the patient of which more than half was spent in counseling and coordination of care.  No orders of the defined types were placed in this encounter.  The patient has a good understanding of the overall plan. she agrees  with it. she will call with any problems that may develop before the next visit here.   Harriette Ohara, MD 11/10/17

## 2018-02-23 ENCOUNTER — Other Ambulatory Visit: Payer: Self-pay | Admitting: Hematology and Oncology

## 2018-02-23 DIAGNOSIS — Z1231 Encounter for screening mammogram for malignant neoplasm of breast: Secondary | ICD-10-CM

## 2018-05-19 ENCOUNTER — Ambulatory Visit
Admission: RE | Admit: 2018-05-19 | Discharge: 2018-05-19 | Disposition: A | Discharge status: Home / Self Care | Payer: Medicare Other | Source: Ambulatory Visit | Attending: Hematology and Oncology | Admitting: Hematology and Oncology

## 2018-05-19 DIAGNOSIS — Z1231 Encounter for screening mammogram for malignant neoplasm of breast: Secondary | ICD-10-CM

## 2018-05-21 ENCOUNTER — Other Ambulatory Visit (HOSPITAL_COMMUNITY): Payer: Self-pay | Admitting: Orthopedic Surgery

## 2018-05-21 DIAGNOSIS — M79662 Pain in left lower leg: Secondary | ICD-10-CM

## 2018-05-21 DIAGNOSIS — M7989 Other specified soft tissue disorders: Secondary | ICD-10-CM

## 2018-05-22 ENCOUNTER — Ambulatory Visit (HOSPITAL_COMMUNITY)
Admission: RE | Admit: 2018-05-22 | Discharge: 2018-05-22 | Disposition: A | Discharge status: Home / Self Care | Payer: Medicare Other | Source: Ambulatory Visit | Attending: Cardiovascular Disease | Admitting: Cardiovascular Disease

## 2018-05-22 DIAGNOSIS — M7989 Other specified soft tissue disorders: Secondary | ICD-10-CM | POA: Diagnosis present

## 2018-05-22 DIAGNOSIS — M79662 Pain in left lower leg: Secondary | ICD-10-CM | POA: Diagnosis present

## 2018-05-22 DIAGNOSIS — M79604 Pain in right leg: Secondary | ICD-10-CM

## 2018-08-05 ENCOUNTER — Telehealth: Payer: Self-pay | Admitting: Hematology and Oncology

## 2018-08-05 NOTE — Telephone Encounter (Signed)
Tried to reach regarding 11/20 sch msg I did leave a message for patient to call back about reschedule

## 2018-08-06 ENCOUNTER — Telehealth: Payer: Self-pay | Admitting: Hematology and Oncology

## 2018-08-06 NOTE — Telephone Encounter (Signed)
Scheduled appt per 11/20 sch message,  Left message with appt date and time

## 2018-08-11 ENCOUNTER — Telehealth: Payer: Self-pay | Admitting: Hematology and Oncology

## 2018-08-11 ENCOUNTER — Other Ambulatory Visit: Payer: Self-pay

## 2018-08-11 ENCOUNTER — Inpatient Hospital Stay (HOSPITAL_BASED_OUTPATIENT_CLINIC_OR_DEPARTMENT_OTHER): Payer: Medicare Other | Admitting: Hematology and Oncology

## 2018-08-11 ENCOUNTER — Inpatient Hospital Stay: Payer: Medicare Other | Attending: Hematology and Oncology

## 2018-08-11 ENCOUNTER — Telehealth: Payer: Self-pay

## 2018-08-11 DIAGNOSIS — Z79899 Other long term (current) drug therapy: Secondary | ICD-10-CM | POA: Diagnosis not present

## 2018-08-11 DIAGNOSIS — Z853 Personal history of malignant neoplasm of breast: Secondary | ICD-10-CM | POA: Insufficient documentation

## 2018-08-11 DIAGNOSIS — Z791 Long term (current) use of non-steroidal anti-inflammatories (NSAID): Secondary | ICD-10-CM

## 2018-08-11 DIAGNOSIS — Z1231 Encounter for screening mammogram for malignant neoplasm of breast: Secondary | ICD-10-CM

## 2018-08-11 DIAGNOSIS — M8580 Other specified disorders of bone density and structure, unspecified site: Secondary | ICD-10-CM | POA: Diagnosis not present

## 2018-08-11 DIAGNOSIS — R05 Cough: Secondary | ICD-10-CM | POA: Diagnosis not present

## 2018-08-11 LAB — CBC WITH DIFFERENTIAL (CANCER CENTER ONLY)
Abs Immature Granulocytes: 0.01 10*3/uL (ref 0.00–0.07)
Basophils Absolute: 0 10*3/uL (ref 0.0–0.1)
Basophils Relative: 0 %
Eosinophils Absolute: 0.1 10*3/uL (ref 0.0–0.5)
Eosinophils Relative: 1 %
HCT: 39.4 % (ref 36.0–46.0)
Hemoglobin: 13.1 g/dL (ref 12.0–15.0)
Immature Granulocytes: 0 %
Lymphocytes Relative: 32 %
Lymphs Abs: 2.5 10*3/uL (ref 0.7–4.0)
MCH: 30.4 pg (ref 26.0–34.0)
MCHC: 33.2 g/dL (ref 30.0–36.0)
MCV: 91.4 fL (ref 80.0–100.0)
Monocytes Absolute: 0.4 10*3/uL (ref 0.1–1.0)
Monocytes Relative: 5 %
Neutro Abs: 4.7 10*3/uL (ref 1.7–7.7)
Neutrophils Relative %: 62 %
Platelet Count: 324 10*3/uL (ref 150–400)
RBC: 4.31 MIL/uL (ref 3.87–5.11)
RDW: 14.4 % (ref 11.5–15.5)
WBC Count: 7.7 10*3/uL (ref 4.0–10.5)
nRBC: 0 % (ref 0.0–0.2)

## 2018-08-11 LAB — CMP (CANCER CENTER ONLY)
ALT: 25 U/L (ref 0–44)
AST: 24 U/L (ref 15–41)
Albumin: 4.3 g/dL (ref 3.5–5.0)
Alkaline Phosphatase: 75 U/L (ref 38–126)
Anion gap: 11 (ref 5–15)
BUN: 19 mg/dL (ref 8–23)
CO2: 24 mmol/L (ref 22–32)
Calcium: 9.6 mg/dL (ref 8.9–10.3)
Chloride: 105 mmol/L (ref 98–111)
Creatinine: 0.87 mg/dL (ref 0.44–1.00)
GFR, Est AFR Am: >60 mL/min (ref >60)
GFR, Estimated: >60 mL/min (ref >60)
Glucose, Bld: 89 mg/dL (ref 70–99)
Potassium: 4.1 mmol/L (ref 3.5–5.1)
Sodium: 140 mmol/L (ref 135–145)
Total Bilirubin: 0.4 mg/dL (ref 0.3–1.2)
Total Protein: 8.1 g/dL (ref 6.5–8.1)

## 2018-08-11 MED ORDER — LORAZEPAM 1 MG PO TABS: 1.0000 mg | ORAL_TABLET | Freq: Three times a day (TID) | ORAL | 0 refills | Status: DC | PRN

## 2018-08-11 MED ORDER — GABAPENTIN 300 MG PO CAPS: 300.0000 mg | ORAL_CAPSULE | Freq: Every day | ORAL | Status: DC

## 2018-08-11 NOTE — Telephone Encounter (Signed)
Patient called regarding moving up her 12/26 appt with Dr. Lindi Adie.  Patient stated that she had spoken with someone yesterday regarding this.  I spoke with Dr. Geralyn Flash nurse and they had not spoken with the patient.  Per Dr. Geralyn Flash nurse, there is no earlier appt available at this time. I advised patient.  Patient insisted on speaking with the nurse. Transferred call to May, R.N.   Asked patient to please leave a message with Dr. Geralyn Flash nurse.

## 2018-08-11 NOTE — Telephone Encounter (Signed)
Called pt back to address her scheduling needs. Pt states that she needs to see Dr.Gudena today. She is having chest pain and cough for a while now. She is not on any current hormonal therapy. Pt has been trying to get scheduled for a few weeks now. She would appreciate time to see MD today. Discussed with MD and okay to see today in the afternoon.Confirmed time/date of lab/md appt today.Pt verbalized understanding.

## 2018-08-11 NOTE — Progress Notes (Signed)
Patient Care Team: Nolene Ebbs, MD as PCP - General (Internal Medicine) Carol Ada, MD as Consulting Physician (Gastroenterology) Foye Spurling, MD as Consulting Physician (Internal Medicine) Ena Dawley, MD as Consulting Physician (Obstetrics and Gynecology)  DIAGNOSIS:    ICD-10-CM   1. NEOPLASM, MALIGNANT, BREAST, HX OF Z85.3      CHIEF COMPLIANT: Surveillance of breast cancer  INTERVAL HISTORY: Elaine Simon is a 67 y.o. with above-mentioned history of breast cancer 25 years ago who is here for an annual checkup. Her most recent mammogram on 05/19/18 showed no evidence of malignancy. I last saw the patient 9 months ago. She presents to the clinic today alone and notes she has been doing okay. She reports chest discomfort when moving from side to side and that she can feel scar tissue upon self-administered breast examinations. She denies nipple discharge, dimpling, or other symptoms. She notes a persistent cough. She noted interest in doing a blood test to detect cancer cells and willingness to participate in research. She notes she hasn't been exercising and has gained weight recently, but she has tried to cut back on carbohydrates. Her last MRI was in 2006 and she will plan to get one this year. She reviewed her medication list with me.   REVIEW OF SYSTEMS:   Constitutional: Denies fevers, chills or abnormal weight loss Eyes: Denies blurriness of vision Ears, nose, mouth, throat, and face: Denies mucositis or sore throat Respiratory: Denies dyspnea or wheezes (+) persistent cough Cardiovascular: Denies palpitation, chest discomfort  Gastrointestinal:  Denies nausea, heartburn or change in bowel habits Skin: Denies abnormal skin rashes Lymphatics: Denies new lymphadenopathy or easy bruising Neurological:Denies numbness, tingling or new weaknesses Behavioral/Psych: Mood is stable, no new changes  Extremities: No lower extremity edema Breast: denies any lumps or  nodules in either breasts (+) discomfort when moving side to side  All other systems were reviewed with the patient and are negative.  I have reviewed the past medical history, past surgical history, social history and family history with the patient and they are unchanged from previous note.  ALLERGIES:  is allergic to adhesive [tape] and codeine.  MEDICATIONS:  Current Outpatient Medications  Medication Sig Dispense Refill  . ALPRAZolam (XANAX) 1 MG tablet Take 1 mg by mouth as needed.    . cholecalciferol (VITAMIN D) 1000 UNITS tablet Take 1,000 Units by mouth daily.    . diclofenac sodium (VOLTAREN) 1 % GEL Apply 2 g topically 4 (four) times daily. 1 Tube 0  . docusate sodium (COLACE) 100 MG capsule Take 100 mg by mouth daily as needed for mild constipation.    . hydrochlorothiazide (MICROZIDE) 12.5 MG capsule Take 12.5 mg by mouth daily.     . meloxicam (MOBIC) 7.5 MG tablet Take 1 tablet (7.5 mg total) by mouth daily. 15 tablet 0  . pravastatin (PRAVACHOL) 40 MG tablet Take 40 mg by mouth daily. PM    . sertraline (ZOLOFT) 50 MG tablet     . traZODone (DESYREL) 50 MG tablet Take 50 mg by mouth at bedtime.     No current facility-administered medications for this visit.     PHYSICAL EXAMINATION: ECOG PERFORMANCE STATUS: 1 - Symptomatic but completely ambulatory  Vitals:   08/11/18 1355  BP: 133/68  Pulse: 89  Resp: 17  Temp: 98 F (36.7 C)  SpO2: 96%   Filed Weights   08/11/18 1355  Weight: 196 lb 8 oz (89.1 kg)    GENERAL:alert, no distress and comfortable  SKIN: skin color, texture, turgor are normal, no rashes or significant lesions EYES: normal, Conjunctiva are pink and non-injected, sclera clear OROPHARYNX:no exudate, no erythema and lips, buccal mucosa, and tongue normal  NECK: supple, thyroid normal size, non-tender, without nodularity LYMPH:  no palpable lymphadenopathy in the cervical, axillary or inguinal LUNGS: clear to auscultation and percussion with  normal breathing effort HEART: regular rate & rhythm and no murmurs and no lower extremity edema ABDOMEN:abdomen soft, non-tender and normal bowel sounds MUSCULOSKELETAL:no cyanosis of digits and no clubbing  NEURO: alert & oriented x 3 with fluent speech, no focal motor/sensory deficits EXTREMITIES: No lower extremity edema BREAST:.  Scar tissue was palpable in the left breast no palpable axillary supraclavicular or infraclavicular adenopathy no breast tenderness or nipple discharge. (exam performed in the presence of a chaperone)  LABORATORY DATA:  I have reviewed the data as listed CMP Latest Ref Rng & Units 08/11/2018 11/10/2017 11/07/2016  Glucose 70 - 99 mg/dL 89 102 103  BUN 8 - 23 mg/dL 19 21 12.6  Creatinine 0.44 - 1.00 mg/dL 0.87 1.17(H) 1.1  Sodium 135 - 145 mmol/L 140 138 136  Potassium 3.5 - 5.1 mmol/L 4.1 4.0 4.0  Chloride 98 - 111 mmol/L 105 104 -  CO2 22 - 32 mmol/L 24 24 25   Calcium 8.9 - 10.3 mg/dL 9.6 10.5(H) 10.3  Total Protein 6.5 - 8.1 g/dL 8.1 8.1 8.4(H)  Total Bilirubin 0.3 - 1.2 mg/dL 0.4 0.3 0.31  Alkaline Phos 38 - 126 U/L 75 71 71  AST 15 - 41 U/L 24 20 23   ALT 0 - 44 U/L 25 31 29     Lab Results  Component Value Date   WBC 7.7 08/11/2018   HGB 13.1 08/11/2018   HCT 39.4 08/11/2018   MCV 91.4 08/11/2018   PLT 324 08/11/2018   NEUTROABS 4.7 08/11/2018    ASSESSMENT & PLAN:  NEOPLASM, MALIGNANT, BREAST, HX OF Stage 1 left breast cancer 1995 treated with lumpectomy and 19 node left axillary dissection, chemotherapy (possibly 6 cycles adriamycin combination) and radiation. Declined tamoxifen, was on Evista discontinued 2010  Breast cancer surveillance: 1.  Mammogram 05/19/2018: Benign breast density category D 2.  Breast exam 08/11/2018: Benign  Osteopenia: Last bone density 2016 showed a T score of -2.2, previously 2013 it was -3.3. She is currently not taking any Evista.  Patient is very keen on doing a blood test or scan to detect any evidence  of breast cancer recurrence.  I informed her that there are no blood tests that could detect breast cancer cells at this time.  There is no role of routine CT scans either.  Based upon high density breast tissue and the palpable nodularity from scar tissue, I recommended that she undergo breast MRI every couple of years.  We will set her up for the breast MRI.  Survivorship: I strongly encouraged her to exercise and monitor her diet. Return to clinic in 1 year for follow-up    No orders of the defined types were placed in this encounter.  The patient has a good understanding of the overall plan. she agrees with it. she will call with any problems that may develop before the next visit here.  Nicholas Lose, MD 08/11/2018   I, Cloyde Reams Dorshimer, am acting as scribe for Nicholas Lose, MD.  I have reviewed the above documentation for accuracy and completeness, and I agree with the above.

## 2018-08-11 NOTE — Telephone Encounter (Signed)
Gave avs and calendar ° °

## 2018-08-11 NOTE — Assessment & Plan Note (Addendum)
Stage 1 left breast cancer 1995 treated with lumpectomy and 19 node left axillary dissection, chemotherapy (possibly 6 cycles adriamycin combination) and radiation. Declined tamoxifen, was on Evista discontinued 2010  Breast cancer surveillance: 1.  Mammogram 05/19/2018: Benign breast density category D 2.  Breast exam 08/11/2018: Benign  Osteopenia: Last bone density 2016 showed a T score of -2.2, previously 2013 it was -3.3. Elaine Simon is currently not taking any Evista.  Patient is very keen on doing a blood test or scan to detect any evidence of breast cancer recurrence.  I informed her that there are no blood tests that could detect breast cancer cells at this time.  There is no role of routine CT scans either.  Return to clinic in 1 year for follow-up

## 2018-08-24 ENCOUNTER — Ambulatory Visit (HOSPITAL_COMMUNITY)
Admission: RE | Admit: 2018-08-24 | Discharge: 2018-08-24 | Disposition: A | Discharge status: Home / Self Care | Payer: Medicare Other | Source: Ambulatory Visit | Attending: Hematology and Oncology | Admitting: Hematology and Oncology

## 2018-08-24 DIAGNOSIS — Z1231 Encounter for screening mammogram for malignant neoplasm of breast: Secondary | ICD-10-CM | POA: Insufficient documentation

## 2018-08-24 MED ORDER — GADOBUTROL 1 MMOL/ML IV SOLN
10.0000 mL | Freq: Once | INTRAVENOUS | Status: AC | PRN
  Administered 2018-08-24: 8 mL via INTRAVENOUS

## 2018-08-24 NOTE — Progress Notes (Signed)
Patient arrived in MRI today demanding that her MRI Breast be done today. She was scheduled for 09/23/18. I explained that she was on our schedule for Jan 8 and she got even more angry stating this was our fault that she was called yesterday that her appointment was today. There were no notes in Epic about her appointment being scheduled. I explained to her that I would call Per [employer] and see if she can be pre certed for today and she demanded that we scan her any way. I explained that we could do that only if she signed  The financial waiver and she refused. I called Pre Cert and she had UMR/Medicare and did not need a pre cert. I was able to work her into our schedule. She was very angry about the situation as well as the position she had to lay in for this scan stating it was very uncomfortable. We tried to make her as comfortable as possible and did everything we could to make this pleasant for her, even though we were working her [place]

## 2018-08-25 ENCOUNTER — Telehealth: Payer: Self-pay

## 2018-08-25 NOTE — Telephone Encounter (Signed)
-----   Message from Gardenia Phlegm, NP sent at 08/25/2018  3:04 PM EST ----- MRI breasts normal, recommend repeat in one year.  Please notify patient ----- Message ----- From: Interface, Rad Results In Sent: 08/24/2018   5:07 PM EST To: Nicholas Lose, MD

## 2018-08-25 NOTE — Telephone Encounter (Signed)
Spoke with patient informing of normal MRI and recommendation to repeat in 1 year.  Voiced understanding with no issues at this time.

## 2018-08-31 ENCOUNTER — Other Ambulatory Visit: Payer: Self-pay | Admitting: Hematology and Oncology

## 2018-09-10 ENCOUNTER — Ambulatory Visit: Payer: Medicare Other | Admitting: Hematology and Oncology

## 2018-09-10 ENCOUNTER — Other Ambulatory Visit: Payer: Medicare Other

## 2018-09-23 ENCOUNTER — Ambulatory Visit (HOSPITAL_COMMUNITY): Payer: Medicare Other

## 2018-09-23 ENCOUNTER — Other Ambulatory Visit (HOSPITAL_COMMUNITY): Payer: Medicare Other

## 2018-11-04 IMAGING — MG 2D DIGITAL DIAGNOSTIC BILATERAL MAMMOGRAM WITH CAD AND ADJUNCT T
8 of 12 series · 8 of 28 positions shown · non-contrast
Comparison: Previous exam(s).

CLINICAL DATA: 65-year-old female with pain throughout the lateral
left breast. Patient also reports a palpable abnormality in the
supraclavicular region on the left. The patient states she had a
neck CT earlier today at an outside institution. The patient did
bring her neck CT images on a CD. These images could not be properly
loaded into the system. Patient has history of prior left breast
cancer post lumpectomy in 0885 followed by radiation therapy. In
addition right breast reduction in 6104.

EXAM:
2D DIGITAL DIAGNOSTIC BILATERAL MAMMOGRAM WITH CAD AND ADJUNCT TOMO
LEFT BREAST ULTRASOUND

[L MLO synth-2D]
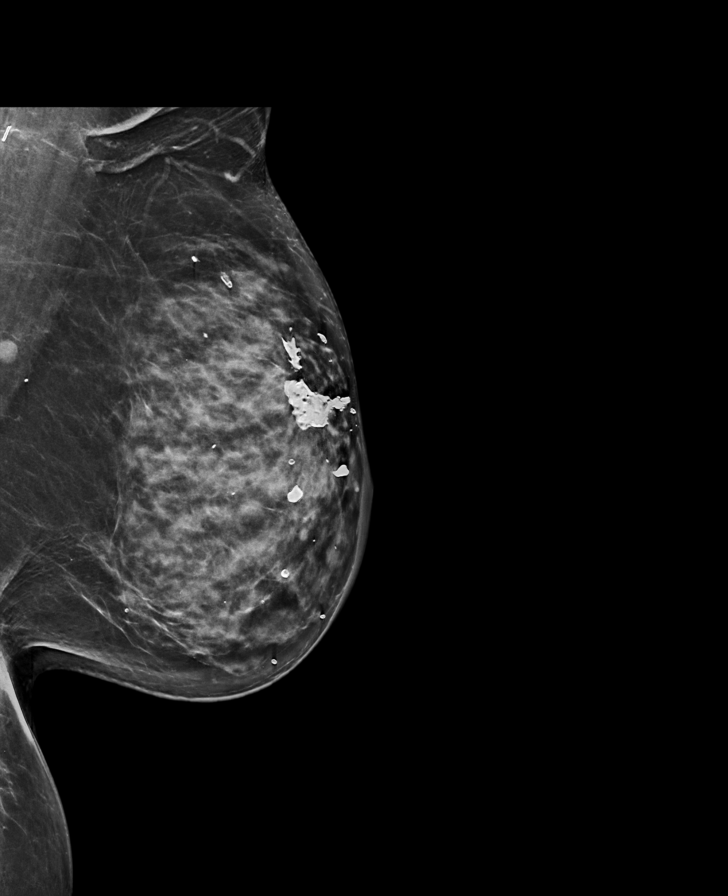

[R MLO synth-2D]
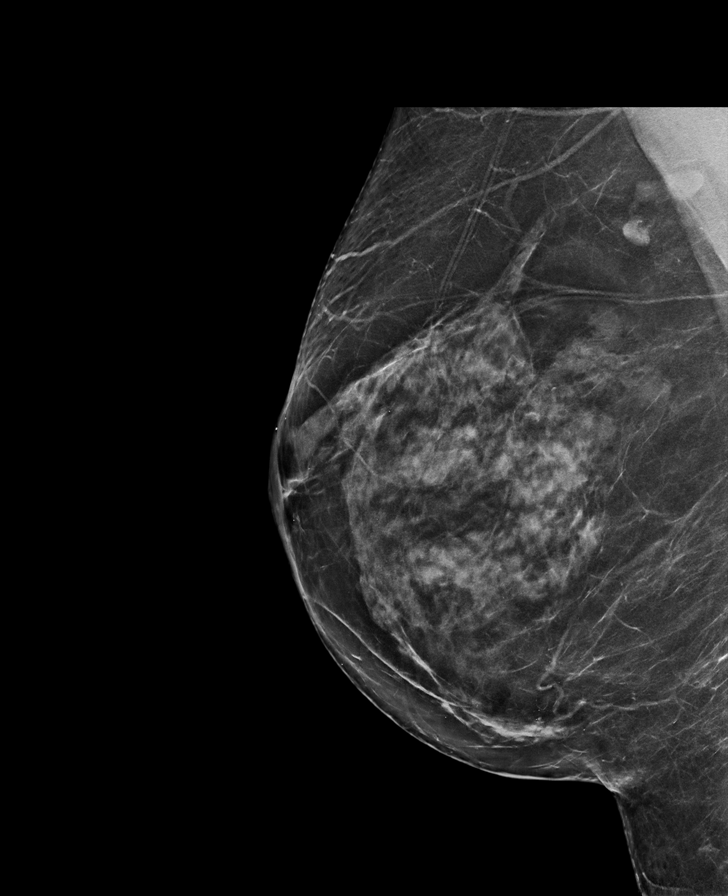

[R CC]
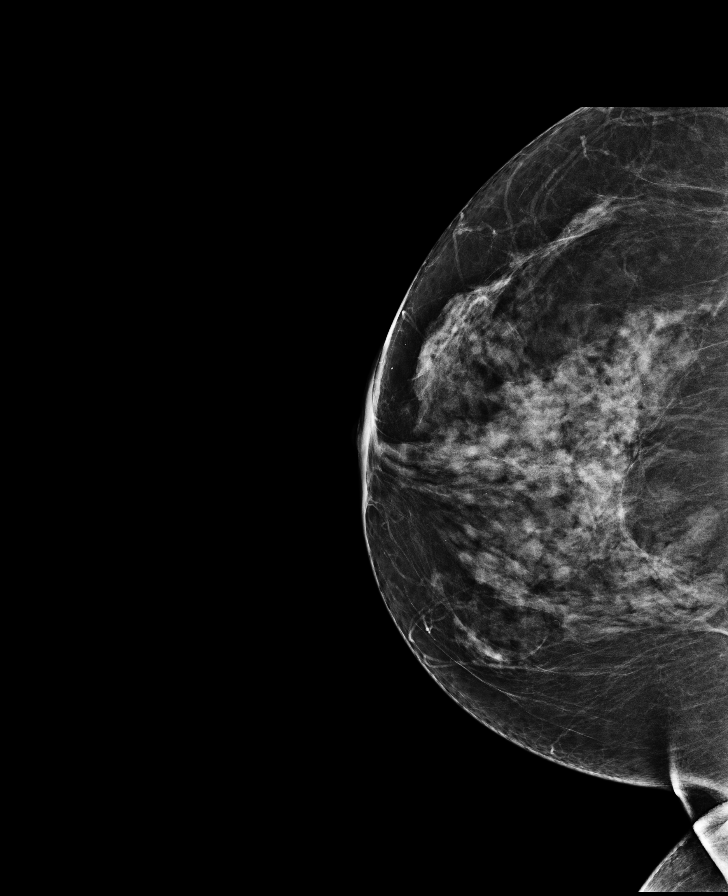

[L MLO]
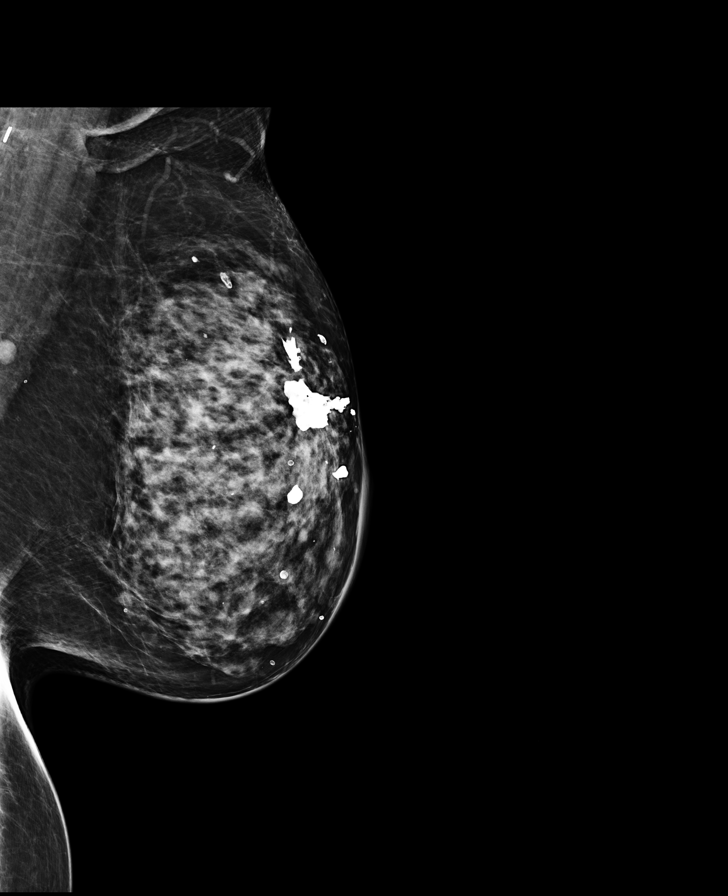

[L CC]
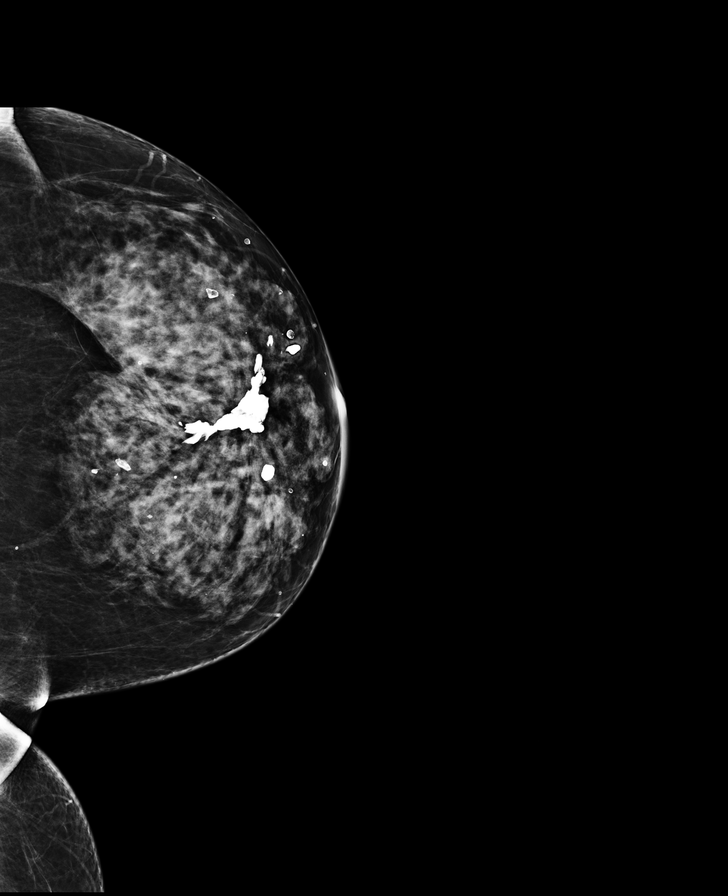

[R CC synth-2D]
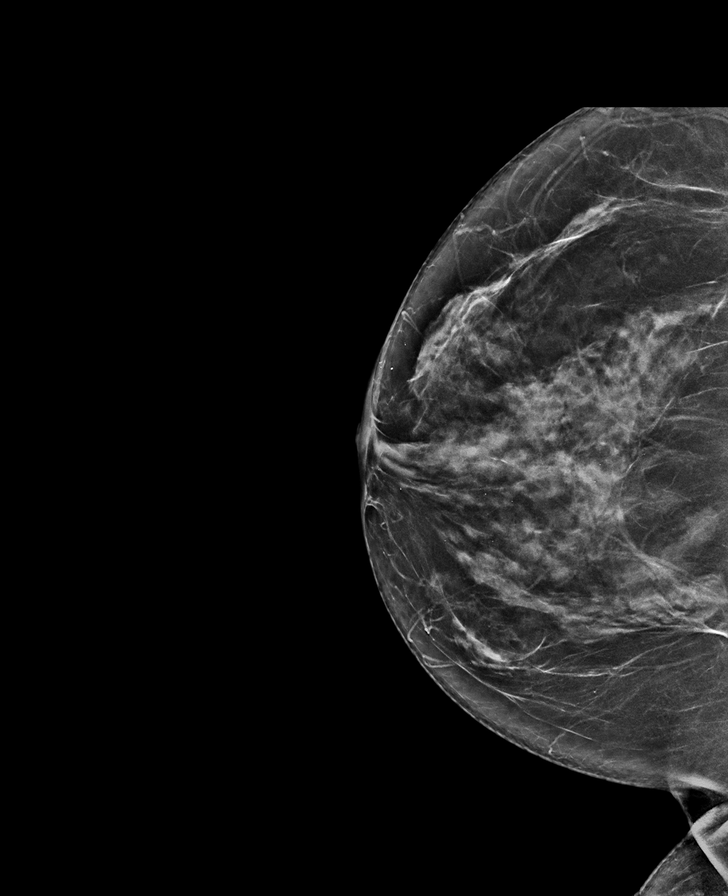

[L CC synth-2D]
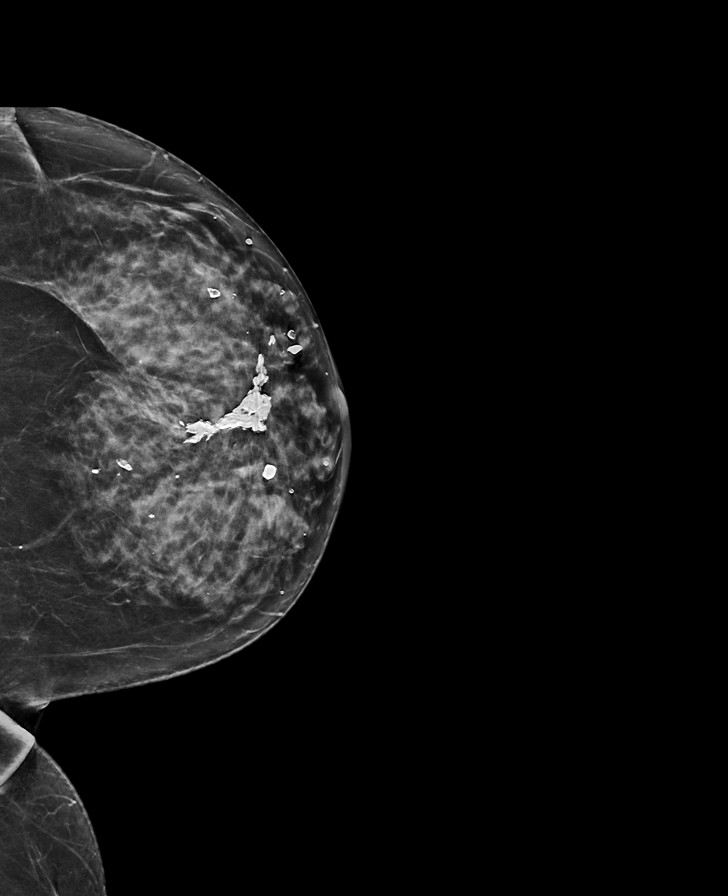

[R MLO]
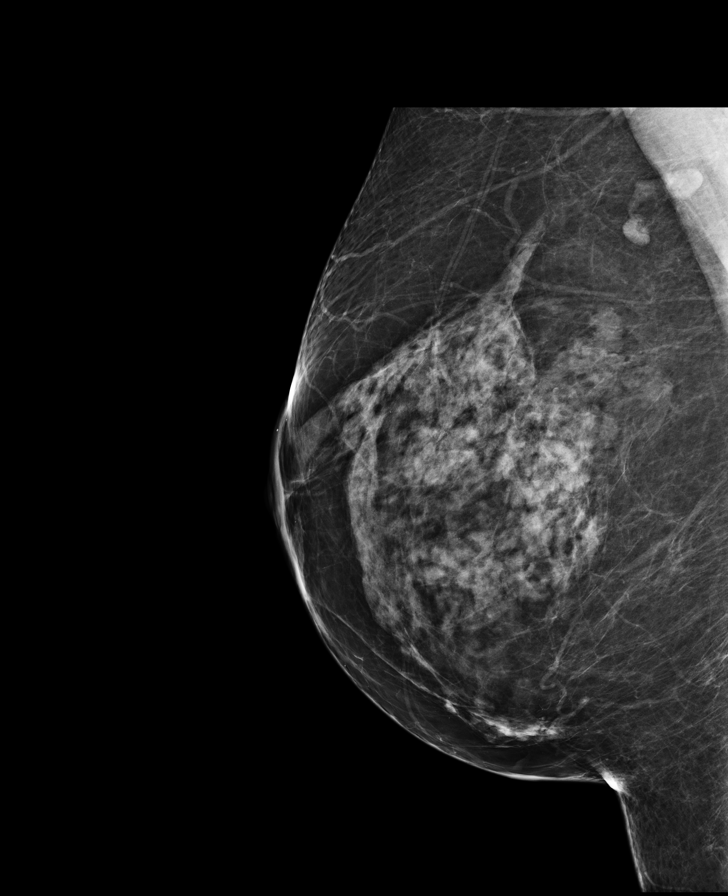

[8 of 28 positions shown; findings below may reference images not displayed]

ACR Breast Density Category c: The breast tissue is heterogeneously
dense, which may obscure small masses.
FINDINGS: No suspicious masses or calcifications are seen in either breast.
Postoperative changes are again seen in the central upper anterior
left breast related to remote lumpectomy. There are no mammographic
findings of malignancy in either breast.

Mammographic images were processed with CAD.

Physical examination at site of palpable concern in the left
supraclavicular region reveals a large area of soft tissue fullness
bilaterally, slightly more prominent on the left than the right.

Targeted ultrasound of the outer left breast in the region of pain
was performed. No discrete masses or abnormalities are identified in
this region.

Targeted ultrasound of the left supraclavicular soft tissues was
performed. No suspicious masses or abnormalities are seen, only
normal appearing adipose tissue is visualized. A small incidentally
seen benign-appearing supraclavicular lymph node measures 0.5 x
by 0.8 cm.
IMPRESSION: 1.  No mammographic evidence of malignancy in either breast.

2. No mammographic or sonographic correlate for the pain involving
the outer left breast.

3. No abnormality seen sonographically in the left supraclavicular
region, only normal-appearing adipose tissue and a benign appearing
lymph node are identified. Recommend correlation with patient's neck
CT.

RECOMMENDATION:
1. Recommend further management of the left breast pain and left
supraclavicular palpable abnormality be based on clinical
assessment.

2.  Screening mammogram in one year.(Code:[id number])

I have discussed the findings and recommendations with the patient.
Results were also provided in writing at the conclusion of the
visit. If applicable, a reminder letter will be sent to the patient
regarding the next appointment.

BI-RADS CATEGORY  1: Negative.

## 2018-11-10 ENCOUNTER — Ambulatory Visit: Payer: Medicare Other | Admitting: Hematology and Oncology

## 2018-11-10 ENCOUNTER — Other Ambulatory Visit: Payer: Medicare Other

## 2018-11-19 ENCOUNTER — Other Ambulatory Visit: Payer: Medicare Other

## 2018-11-19 ENCOUNTER — Ambulatory Visit: Payer: Medicare Other | Admitting: Hematology and Oncology

## 2018-11-24 ENCOUNTER — Other Ambulatory Visit: Payer: Self-pay | Admitting: Internal Medicine

## 2018-11-24 DIAGNOSIS — E2839 Other primary ovarian failure: Secondary | ICD-10-CM

## 2019-02-23 ENCOUNTER — Ambulatory Visit: Payer: Self-pay | Admitting: Cardiology

## 2019-03-09 ENCOUNTER — Other Ambulatory Visit: Payer: Self-pay | Admitting: Internal Medicine

## 2019-03-09 DIAGNOSIS — Z20822 Contact with and (suspected) exposure to covid-19: Secondary | ICD-10-CM

## 2019-03-12 LAB — NOVEL CORONAVIRUS, NAA: SARS-CoV-2, NAA: NOT DETECTED

## 2019-04-13 ENCOUNTER — Ambulatory Visit: Payer: Self-pay | Admitting: Cardiology

## 2019-04-27 ENCOUNTER — Other Ambulatory Visit: Payer: Self-pay | Admitting: Cardiology

## 2019-05-26 ENCOUNTER — Telehealth: Payer: Self-pay | Admitting: Hematology and Oncology

## 2019-05-26 NOTE — Telephone Encounter (Signed)
Returned patient's phone call regarding rescheduling an appointment, left a voicemail. 

## 2019-05-27 ENCOUNTER — Telehealth: Payer: Self-pay | Admitting: Hematology and Oncology

## 2019-05-27 NOTE — Telephone Encounter (Signed)
Patient called regarding 12/06 appointment, per patient's request appointment has been rescheduled for 11/23.

## 2019-06-20 NOTE — Progress Notes (Signed)
Patient Care Team: Nolene Ebbs, MD as PCP - General (Internal Medicine) Carol Ada, MD as Consulting Physician (Gastroenterology) Foye Spurling, MD as Consulting Physician (Internal Medicine) Ena Dawley, MD as Consulting Physician (Obstetrics and Gynecology)  DIAGNOSIS:    ICD-10-CM   1. NEOPLASM, MALIGNANT, BREAST, HX OF  Z85.3   2. Encounter for screening mammogram for malignant neoplasm of breast  Z12.31 MR BREAST BILATERAL W WO CONTRAST INC CAD    CHIEF COMPLIANT: Surveillance of breast cancer  INTERVAL HISTORY: Elaine Simon is a 68 y.o. with above-mentioned history of breast cancer 25 years ago. I last saw her a year ago. Breast MRI on 08/24/18 showed no evidence of malignancy. She presents to the clinic today for annual follow-up.   REVIEW OF SYSTEMS:   Constitutional: Denies fevers, chills or abnormal weight loss Eyes: Denies blurriness of vision Ears, nose, mouth, throat, and face: Denies mucositis or sore throat Respiratory: Denies cough, dyspnea or wheezes Cardiovascular: Denies palpitation, chest discomfort Gastrointestinal: Denies nausea, heartburn or change in bowel habits Skin: Denies abnormal skin rashes Lymphatics: Denies new lymphadenopathy or easy bruising Neurological: Denies numbness, tingling or new weaknesses Behavioral/Psych: Mood is stable, no new changes  Extremities: No lower extremity edema Breast: denies any pain or lumps or nodules in either breasts All other systems were reviewed with the patient and are negative.  I have reviewed the past medical history, past surgical history, social history and family history with the patient and they are unchanged from previous note.  ALLERGIES:  is allergic to adhesive [tape] and codeine.  MEDICATIONS:  Current Outpatient Medications  Medication Sig Dispense Refill  . cholecalciferol (VITAMIN D) 1000 UNITS tablet Take 1,000 Units by mouth daily.    . diclofenac sodium (VOLTAREN) 1 %  GEL Apply 2 g topically 4 (four) times daily. 1 Tube 0  . docusate sodium (COLACE) 100 MG capsule Take 100 mg by mouth daily as needed for mild constipation.    . gabapentin (NEURONTIN) 300 MG capsule Take 1 capsule (300 mg total) by mouth at bedtime.    . hydrochlorothiazide (MICROZIDE) 12.5 MG capsule Take 12.5 mg by mouth daily.     Marland Kitchen LORazepam (ATIVAN) 1 MG tablet TAKE 1 TABLET BY MOUTH EVERY 8 HOURS AS NEEDED FOR ANXIETY OR NAUSEA 2 tablet 0  . losartan (COZAAR) 25 MG tablet TAKE 1 TABLET BY MOUTH DAILY 30 tablet 3  . meloxicam (MOBIC) 7.5 MG tablet Take 1 tablet (7.5 mg total) by mouth daily. 15 tablet 0  . pravastatin (PRAVACHOL) 40 MG tablet Take 40 mg by mouth daily. PM    . traZODone (DESYREL) 50 MG tablet Take 50 mg by mouth at bedtime.     No current facility-administered medications for this visit.     PHYSICAL EXAMINATION: ECOG PERFORMANCE STATUS: 1 - Symptomatic but completely ambulatory  Vitals:   06/21/19 1500  BP: (!) 153/81  Pulse: 72  Resp: 17  Temp: 98 F (36.7 C)  SpO2: 98%   Filed Weights   06/21/19 1500  Weight: 194 lb 12.8 oz (88.4 kg)    GENERAL: alert, no distress and comfortable SKIN: skin color, texture, turgor are normal, no rashes or significant lesions EYES: normal, Conjunctiva are pink and non-injected, sclera clear OROPHARYNX: no exudate, no erythema and lips, buccal mucosa, and tongue normal  NECK: supple, thyroid normal size, non-tender, without nodularity LYMPH: no palpable lymphadenopathy in the cervical, axillary or inguinal LUNGS: clear to auscultation and percussion with normal breathing effort  HEART: regular rate & rhythm and no murmurs and no lower extremity edema ABDOMEN: abdomen soft, non-tender and normal bowel sounds MUSCULOSKELETAL: no cyanosis of digits and no clubbing  NEURO: alert & oriented x 3 with fluent speech, no focal motor/sensory deficits EXTREMITIES: No lower extremity edema BREAST: No palpable masses or nodules in  either right or left breasts. No palpable axillary supraclavicular or infraclavicular adenopathy no breast tenderness or nipple discharge. (exam performed in the presence of a chaperone)  LABORATORY DATA:  I have reviewed the data as listed CMP Latest Ref Rng & Units 08/11/2018 11/10/2017 11/07/2016  Glucose 70 - 99 mg/dL 89 102 103  BUN 8 - 23 mg/dL 19 21 12.6  Creatinine 0.44 - 1.00 mg/dL 0.87 1.17(H) 1.1  Sodium 135 - 145 mmol/L 140 138 136  Potassium 3.5 - 5.1 mmol/L 4.1 4.0 4.0  Chloride 98 - 111 mmol/L 105 104 -  CO2 22 - 32 mmol/L 24 24 25   Calcium 8.9 - 10.3 mg/dL 9.6 10.5(H) 10.3  Total Protein 6.5 - 8.1 g/dL 8.1 8.1 8.4(H)  Total Bilirubin 0.3 - 1.2 mg/dL 0.4 0.3 0.31  Alkaline Phos 38 - 126 U/L 75 71 71  AST 15 - 41 U/L 24 20 23   ALT 0 - 44 U/L 25 31 29     Lab Results  Component Value Date   WBC 7.7 08/11/2018   HGB 13.1 08/11/2018   HCT 39.4 08/11/2018   MCV 91.4 08/11/2018   PLT 324 08/11/2018   NEUTROABS 4.7 08/11/2018    ASSESSMENT & PLAN:  NEOPLASM, MALIGNANT, BREAST, HX OF Stage 1 left breast cancer 1995 treated with lumpectomy and 19 node left axillary dissection, chemotherapy (possibly 6 cycles adriamycin combination) and radiation. Declined tamoxifen, was on Evista discontinued 2010  Breast cancer surveillance: 1.  Mammogram 05/19/2018: Benign breast density category D 2.  Breast exam 08/11/2018: Benign  Osteopenia: Last bone density 2016 showed a T score of -2.2, previously 2013 it was -3.3. She is currently not taking any Evista.  Based upon high density breast tissue and the palpable nodularity from scar tissue, I recommended that she undergo breast MRI every couple of years.   Breast MRI 08/24/2018: Benign breast density category C Mammogram 05/19/2018: Benign breast density category D  Survivorship: I strongly encouraged her to exercise and monitor her diet. I discussed with her about intermittent fasting and exercising. Because of COVID-19 she has  gained weight.  She also tells me that she is increased her alcohol intake and red meat as well.  Return to clinic in 1 year for follow-up    Orders Placed This Encounter  Procedures  . MR BREAST BILATERAL W WO CONTRAST INC CAD    Standing Status:   Future    Standing Expiration Date:   08/20/2020    Order Specific Question:   ** REASON FOR EXAM (FREE TEXT)    Answer:   High risk and density cat D    Order Specific Question:   If indicated for the ordered procedure, I authorize the administration of contrast media per Radiology protocol    Answer:   Yes    Order Specific Question:   What is the patient's sedation requirement?    Answer:   No Sedation    Order Specific Question:   Does the patient have a pacemaker or implanted devices?    Answer:   No    Order Specific Question:   Radiology Contrast Protocol - do NOT remove file path  Answer:   [email or web address]    Order Specific Question:   Preferred imaging location?    Answer:   [facility] (table limit-550lbs)   The patient has a good understanding of the overall plan. she agrees with it. she will call with any problems that may develop before the next visit here.  Nicholas Lose, MD 06/21/2019  Julious Oka Dorshimer am acting as scribe for Dr. Nicholas Lose.  I have reviewed the above documentation for accuracy and completeness, and I agree with the above.

## 2019-06-21 ENCOUNTER — Inpatient Hospital Stay: Payer: Medicare Other | Attending: Hematology and Oncology | Admitting: Hematology and Oncology

## 2019-06-21 ENCOUNTER — Ambulatory Visit: Payer: Self-pay | Admitting: Cardiology

## 2019-06-21 ENCOUNTER — Other Ambulatory Visit: Payer: Self-pay

## 2019-06-21 DIAGNOSIS — M8580 Other specified disorders of bone density and structure, unspecified site: Secondary | ICD-10-CM | POA: Insufficient documentation

## 2019-06-21 DIAGNOSIS — Z1231 Encounter for screening mammogram for malignant neoplasm of breast: Secondary | ICD-10-CM | POA: Diagnosis not present

## 2019-06-21 DIAGNOSIS — Z888 Allergy status to other drugs, medicaments and biological substances status: Secondary | ICD-10-CM | POA: Diagnosis not present

## 2019-06-21 DIAGNOSIS — Z853 Personal history of malignant neoplasm of breast: Secondary | ICD-10-CM

## 2019-06-21 DIAGNOSIS — Z79899 Other long term (current) drug therapy: Secondary | ICD-10-CM | POA: Diagnosis not present

## 2019-06-21 DIAGNOSIS — Z885 Allergy status to narcotic agent status: Secondary | ICD-10-CM | POA: Diagnosis not present

## 2019-06-21 NOTE — Assessment & Plan Note (Addendum)
Stage 1 left breast cancer 1995 treated with lumpectomy and 19 node left axillary dissection, chemotherapy (possibly 6 cycles adriamycin combination) and radiation. Declined tamoxifen, was on Evista discontinued 2010  Breast cancer surveillance: 1.  Mammogram 05/19/2018: Benign breast density category D 2.  Breast exam 08/11/2018: Benign  Osteopenia: Last bone density 2016 showed a T score of -2.2, previously 2013 it was -3.3. She is currently not taking any Evista.  Based upon high density breast tissue and the palpable nodularity from scar tissue, I recommended that she undergo breast MRI every couple of years.   Breast MRI 08/24/2018: Benign breast density category C Mammogram 05/19/2018: Benign breast density category D  Survivorship: I strongly encouraged her to exercise and monitor her diet. Return to clinic in 1 year for follow-up

## 2019-06-21 NOTE — Progress Notes (Signed)
Resheduled.

## 2019-06-22 ENCOUNTER — Other Ambulatory Visit: Payer: Self-pay | Admitting: Hematology and Oncology

## 2019-06-22 DIAGNOSIS — Z853 Personal history of malignant neoplasm of breast: Secondary | ICD-10-CM

## 2019-06-22 DIAGNOSIS — Z91199 Patient's noncompliance with other medical treatment and regimen due to unspecified reason: Secondary | ICD-10-CM | POA: Insufficient documentation

## 2019-06-22 DIAGNOSIS — Z5329 Procedure and treatment not carried out because of patient's decision for other reasons: Secondary | ICD-10-CM | POA: Insufficient documentation

## 2019-06-22 NOTE — Progress Notes (Signed)
[id number]  

## 2019-06-24 ENCOUNTER — Ambulatory Visit (INDEPENDENT_AMBULATORY_CARE_PROVIDER_SITE_OTHER): Payer: Medicare Other | Admitting: Cardiology

## 2019-06-24 ENCOUNTER — Other Ambulatory Visit: Payer: Self-pay

## 2019-06-24 ENCOUNTER — Encounter: Payer: Self-pay | Admitting: Cardiology

## 2019-06-24 VITALS — BP 144/92 | HR 79 | Temp 97.7°F | Ht 62.0 in | Wt 194.0 lb

## 2019-06-24 DIAGNOSIS — R0609 Other forms of dyspnea: Secondary | ICD-10-CM | POA: Insufficient documentation

## 2019-06-24 DIAGNOSIS — I10 Essential (primary) hypertension: Secondary | ICD-10-CM

## 2019-06-24 DIAGNOSIS — R0683 Snoring: Secondary | ICD-10-CM

## 2019-06-24 DIAGNOSIS — R0600 Dyspnea, unspecified: Secondary | ICD-10-CM

## 2019-06-24 MED ORDER — AMLODIPINE BESYLATE 5 MG PO TABS: 5.0000 mg | ORAL_TABLET | Freq: Every day | ORAL | 3 refills | Status: DC

## 2019-06-24 NOTE — Progress Notes (Signed)
Patient referred by Elaine Ebbs, MD for hypertension  Subjective:   Elaine Simon, female    DOB: [birth year], 68 y.o.   MRN: [id number]   Chief Complaint  Patient presents with  . Hypertension  . Follow-up    66 MONTH     HPI  68 y/o [name] American female female with hypertension, hyperlipidemia, prediabetes, hypothyroidism, h/o breast cancer.   Patient was previously followed by my partner Dr. Woody Simon.  Patient lives alone.  She does not do any regular physical exercise.  During the pandemic, she endorses eating unhealthy diet, with large portions.  She denies any chest pain.  She has mild exertional dyspnea with climbing up stairs.  Pressure is uncontrolled.  She is very keen to change her diet and lifestyle to reduce her cardiovascular risk.  She has noticed cough at night.  She recalls that she had cough with lisinopril and was thus switched to losartan in the past.  She endorses periods of snoring at night.  Past Medical History:  Diagnosis Date  . Anxiety   . Breast cancer ([place]) 1995  . Cataract immature    bilat.  . Depression   . Hay fever   . Hx of mitral valve prolapse    has had no problems  . Hyperlipidemia   . Hypertension   . Hypothyroidism   . Insomnia   . Osteoarthritis   . Panic attacks   . Personal history of radiation therapy 1995  . Snores    states occ. apnea, occ. wakes up coughing; has never had sleep study;  denies daytime sleepiness     Past Surgical History:  Procedure Laterality Date  . BREAST LUMPECTOMY Left 1995   left  . BREAST RECONSTRUCTION  04/01/2012   Procedure: BREAST RECONSTRUCTION;  Surgeon: Theodoro Kos, DO;  Location: [facility];  Service: Plastics;  Laterality: Right;  Right breast mastopexy reduction   . GANGLION CYST EXCISION    . LYMPH NODE DISSECTION    . TONSILLECTOMY    . UTERINE FIBROID SURGERY       Social History   Socioeconomic History  . Marital status: Single    Spouse name: Not  on file  . Number of children: Not on file  . Years of education: Not on file  . Highest education level: Not on file  Occupational History  . Not on file  Social Needs  . Financial resource strain: Not on file  . Food insecurity    Worry: Not on file    Inability: Not on file  . Transportation needs    Medical: Not on file    Non-medical: Not on file  Tobacco Use  . Smoking status: Never Smoker  . Smokeless tobacco: Never Used  Substance and Sexual Activity  . Alcohol use: Yes    Comment: occasionally  . Drug use: No  . Sexual activity: Not on file  Lifestyle  . Physical activity    Days per week: Not on file    Minutes per session: Not on file  . Stress: Not on file  Relationships  . Social Herbalist on phone: Not on file    Gets together: Not on file    Attends religious service: Not on file    Active member of club or organization: Not on file    Attends meetings of clubs or organizations: Not on file    Relationship status: Not on file  . Intimate partner violence  Fear of current or ex partner: Not on file    Emotionally abused: Not on file    Physically abused: Not on file    Forced sexual activity: Not on file  Other Topics Concern  . Not on file  Social History Narrative  . Not on file     Family History  Problem Relation Age of Onset  . Heart disease Other   . Diabetes Other   . Breast cancer Other   . Cancer Father 41       prostate  . Cancer Brother 48       prostate  . Cancer Maternal Aunt 60       kidney  . Cancer Maternal Grandfather 50       stomach     Current Outpatient Medications on File Prior to Visit  Medication Sig Dispense Refill  . cholecalciferol (VITAMIN D) 1000 UNITS tablet Take 1,000 Units by mouth daily.    . diclofenac sodium (VOLTAREN) 1 % GEL Apply 2 g topically 4 (four) times daily. 1 Tube 0  . docusate sodium (COLACE) 100 MG capsule Take 100 mg by mouth daily as needed for mild constipation.    .  gabapentin (NEURONTIN) 300 MG capsule Take 1 capsule (300 mg total) by mouth at bedtime.    . hydrochlorothiazide (MICROZIDE) 12.5 MG capsule Take 12.5 mg by mouth daily.     Marland Kitchen LORazepam (ATIVAN) 1 MG tablet TAKE 1 TABLET BY MOUTH EVERY 8 HOURS AS NEEDED FOR ANXIETY OR NAUSEA 2 tablet 0  . losartan (COZAAR) 25 MG tablet TAKE 1 TABLET BY MOUTH DAILY 30 tablet 3  . meloxicam (MOBIC) 7.5 MG tablet Take 1 tablet (7.5 mg total) by mouth daily. 15 tablet 0  . pravastatin (PRAVACHOL) 40 MG tablet Take 40 mg by mouth daily. PM    . traZODone (DESYREL) 50 MG tablet Take 50 mg by mouth at bedtime.     No current facility-administered medications on file prior to visit.     Cardiovascular studies:  EKG 06/24/2019: Probable sinus rhythm 64 bpm.   Early R wave transition. Low voltage in precordial leads.    Lexiscan myoview stress test 09/05/2017:  1. Pharmacologic stress testing was performed with intravenous administration of .4 mg of Lexiscan over a 10-15 seconds infusion. Stress EKG is non diagnostic for ischemia as it is a pharmacologic stress.  2. The overall quality of the study is excellent. There is no evidence of abnormal lung activity. Stress and rest SPECT images demonstrate homogeneous tracer distribution throughout the myocardium. Gated SPECT imaging reveals normal myocardial thickening and wall motion. The left ventricular ejection fraction was normal (81%).   3. This is a low risk study.  Echocardiogram 01/21/2017: Left ventricle cavity is normal in size. Normal global wall motion. Visual EF is 50-55%. Normal diastolic filling pattern. Trileaflet aortic valve with no regurgitation noted. Mild aortic valve leaflet calcification. No evidence of aortic valve stenosis. The aortic root is normal. Mild atherosclerotic changes in the aorta. Compared to the study done on 01/31/2016, mild pulmonary hypertension is no longer present.  Recent labs: Results for Elaine Simon (MRN  [id number]) as of 06/24/2019 09:27  Ref. Range 08/11/2018 13:31  Sodium Latest Ref Range: 135 - 145 mmol/L 140  Potassium Latest Ref Range: 3.5 - 5.1 mmol/L 4.1  Chloride Latest Ref Range: 98 - 111 mmol/L 105  CO2 Latest Ref Range: 22 - 32 mmol/L 24  Glucose Latest Ref Range: 70 - 99 mg/dL 89  BUN Latest Ref Range: 8 - 23 mg/dL 19  Creatinine Latest Ref Range: 0.44 - 1.00 mg/dL 0.87  Calcium Latest Ref Range: 8.9 - 10.3 mg/dL 9.6  Anion gap Latest Ref Range: 5 - 15  11  Alkaline Phosphatase Latest Ref Range: 38 - 126 U/L 75  Albumin Latest Ref Range: 3.5 - 5.0 g/dL 4.3  AST Latest Ref Range: 15 - 41 U/L 24  ALT Latest Ref Range: 0 - 44 U/L 25  Total Protein Latest Ref Range: 6.5 - 8.1 g/dL 8.1  Total Bilirubin Latest Ref Range: 0.3 - 1.2 mg/dL 0.4  GFR, Est Non African American Latest Ref Range: >60 mL/min >60  GFR, Est African American Latest Ref Range: >60 mL/min >60   Results for LAGUANA, FERRALL (MRN [id number]) as of 06/24/2019 09:27  Ref. Range 08/11/2018 13:31  WBC Latest Ref Range: 4.0 - 10.5 K/uL 7.7  RBC Latest Ref Range: 3.87 - 5.11 MIL/uL 4.31  Hemoglobin Latest Ref Range: 12.0 - 15.0 g/dL 13.1  HCT Latest Ref Range: 36.0 - 46.0 % 39.4  MCV Latest Ref Range: 80.0 - 100.0 fL 91.4  MCH Latest Ref Range: 26.0 - 34.0 pg 30.4  MCHC Latest Ref Range: 30.0 - 36.0 g/dL 33.2  RDW Latest Ref Range: 11.5 - 15.5 % 14.4  Platelets Latest Ref Range: 150 - 400 K/uL 324  nRBC Latest Ref Range: 0.0 - 0.2 % 0.0    Review of Systems  Constitution: Negative for decreased appetite, malaise/fatigue, weight gain and weight loss.  HENT: Negative for congestion.   Eyes: Negative for visual disturbance.  Cardiovascular: Positive for dyspnea on exertion. Negative for chest pain, leg swelling, palpitations and syncope.  Respiratory: Positive for snoring. Negative for cough.   Endocrine: Negative for cold intolerance.  Hematologic/Lymphatic: Does not bruise/bleed easily.  Skin: Negative  for itching and rash.  Musculoskeletal: Negative for myalgias.  Gastrointestinal: Negative for abdominal pain, nausea and vomiting.  Genitourinary: Negative for dysuria.  Neurological: Negative for dizziness and weakness.  Psychiatric/Behavioral: The patient is not nervous/anxious.   All other systems reviewed and are negative.        Vitals:   06/24/19 1021  BP: (!) 144/82  Pulse: 64  Temp: 97.7 F (36.5 C)  SpO2: 96%     Body mass index is 35.48 kg/m. Filed Weights   06/24/19 1021  Weight: 194 lb (88 kg)     Objective:   Physical Exam  Constitutional: She is oriented to person, place, and time. She appears well-developed and well-nourished. No distress.  Mildly obese  HENT:  Head: Normocephalic and atraumatic.  Eyes: Pupils are equal, round, and reactive to light. Conjunctivae are normal.  Neck: No JVD present.  Cardiovascular: Normal rate, regular rhythm and intact distal pulses.  No murmur heard. Pulmonary/Chest: Effort normal and breath sounds normal. She has no wheezes. She has no rales.  Abdominal: Soft. Bowel sounds are normal. There is no rebound.  Musculoskeletal:        General: No edema.  Lymphadenopathy:    She has no cervical adenopathy.  Neurological: She is alert and oriented to person, place, and time. No cranial nerve deficit.  Skin: Skin is warm and dry.  Psychiatric: She has a normal mood and affect.  Nursing note and vitals reviewed.         Assessment & Recommendations:   68 y/o [name] female female with hypertension, hyperlipidemia, prediabetes, hypothyroidism, h/o breast cancer.  Mild exertional dyspnea: Suspect largely related to  deconditioning, obesity, hypertension.  Will obtain echocardiogram to rule out any structural cardiac abnormality.  Hypertension: Continue hydrochlorothiazide 12.5 mg daily.  She has cough which could still be related to losartan.  Recommend switching to amlodipine 5 mg daily.  Will refer to  sleep study.  Primary risk prevention: Continue statin.  Will obtain lipid panel.  Obesity: Recommend daily exercise with at least 30 min walking or 15 min high intensity exercise most days a week.   Nigel Mormon, MD [facility] Pager: [phone] Office: [phone] If no answer Cell [phone]

## 2019-06-29 ENCOUNTER — Other Ambulatory Visit: Payer: Medicare Other

## 2019-06-30 ENCOUNTER — Institutional Professional Consult (permissible substitution): Payer: Self-pay | Admitting: Neurology

## 2019-06-30 ENCOUNTER — Telehealth: Payer: Self-pay

## 2019-06-30 NOTE — Telephone Encounter (Signed)
Pt did not show for their appt with Dr. Athar today.  

## 2019-07-01 ENCOUNTER — Encounter: Payer: Self-pay | Admitting: Neurology

## 2019-07-01 ENCOUNTER — Other Ambulatory Visit: Payer: Self-pay

## 2019-07-01 ENCOUNTER — Ambulatory Visit (INDEPENDENT_AMBULATORY_CARE_PROVIDER_SITE_OTHER): Payer: Medicare Other

## 2019-07-01 DIAGNOSIS — R0600 Dyspnea, unspecified: Secondary | ICD-10-CM

## 2019-07-01 DIAGNOSIS — I10 Essential (primary) hypertension: Secondary | ICD-10-CM | POA: Diagnosis not present

## 2019-07-01 DIAGNOSIS — R0609 Other forms of dyspnea: Secondary | ICD-10-CM

## 2019-07-05 ENCOUNTER — Encounter: Payer: Medicare Other | Admitting: Obstetrics and Gynecology

## 2019-07-05 ENCOUNTER — Ambulatory Visit: Payer: Self-pay | Admitting: Cardiology

## 2019-07-05 NOTE — Progress Notes (Signed)
Thanks MJP  

## 2019-07-07 ENCOUNTER — Telehealth (INDEPENDENT_AMBULATORY_CARE_PROVIDER_SITE_OTHER): Payer: Medicare Other | Admitting: Cardiology

## 2019-07-07 ENCOUNTER — Encounter: Payer: Self-pay | Admitting: Cardiology

## 2019-07-07 ENCOUNTER — Other Ambulatory Visit: Payer: Self-pay

## 2019-07-07 VITALS — Ht 62.0 in | Wt 188.0 lb

## 2019-07-07 DIAGNOSIS — I361 Nonrheumatic tricuspid (valve) insufficiency: Secondary | ICD-10-CM

## 2019-07-07 DIAGNOSIS — R0683 Snoring: Secondary | ICD-10-CM | POA: Diagnosis not present

## 2019-07-07 DIAGNOSIS — I10 Essential (primary) hypertension: Secondary | ICD-10-CM | POA: Diagnosis not present

## 2019-07-07 NOTE — Progress Notes (Signed)
Patient referred by Nolene Ebbs, MD for hypertension  Subjective:   Elaine Simon, female    DOB: 23-Feb-1951, 68 y.o.   MRN: [id number]   I connected with the patient on 07/07/2019 by a telephone call and verified that I am speaking with the correct person using two identifiers.     I offered the patient a video enabled application for a virtual visit. Unfortunately, this could not be accomplished due to technical difficulties/lack of video enabled phone/computer. I discussed the limitations of evaluation and management by telemedicine and the availability of in person appointments. The patient expressed understanding and agreed to proceed.   This visit type was conducted due to national recommendations for restrictions regarding the COVID-19 Pandemic (e.g. social distancing).  This format is felt to be most appropriate for this patient at this time.  All issues noted in this document were discussed and addressed.  No physical exam was performed (except for noted visual exam findings with Tele health visits).  The patient has consented to conduct a Tele health visit and understands insurance will be billed.   Chief Complaint  Patient presents with  . Hypertension  . Results    echo     HPI  68 y/o [name] American female female with hypertension, hyperlipidemia, prediabetes, hypothyroidism, h/o breast cancer.   Echocardiogram reviewed with the patient. Patient has not been checking her blood pressure regularly, but has noticed improvement in her shortness of breath after starting amlodipine 5 mg and HCTZ 12.5 mg daily. Her cough has also improved after stopping losartan.    Past Medical History:  Diagnosis Date  . Anxiety   . Breast cancer ([place]) 1995  . Cataract immature    bilat.  . Depression   . Hay fever   . Hx of mitral valve prolapse    has had no problems  . Hyperlipidemia   . Hypertension   . Hypothyroidism   . Insomnia   . Osteoarthritis   . Panic  attacks   . Personal history of radiation therapy 1995  . Snores    states occ. apnea, occ. wakes up coughing; has never had sleep study;  denies daytime sleepiness     Past Surgical History:  Procedure Laterality Date  . BREAST LUMPECTOMY Left 1995   left  . BREAST RECONSTRUCTION  04/01/2012   Procedure: BREAST RECONSTRUCTION;  Surgeon: Theodoro Kos, DO;  Location: [facility];  Service: Plastics;  Laterality: Right;  Right breast mastopexy reduction   . GANGLION CYST EXCISION    . LYMPH NODE DISSECTION    . TONSILLECTOMY    . UTERINE FIBROID SURGERY       Social History   Socioeconomic History  . Marital status: Single    Spouse name: Not on file  . Number of children: 0  . Years of education: Not on file  . Highest education level: Not on file  Occupational History  . Not on file  Social Needs  . Financial resource strain: Not on file  . Food insecurity    Worry: Not on file    Inability: Not on file  . Transportation needs    Medical: Not on file    Non-medical: Not on file  Tobacco Use  . Smoking status: Never Smoker  . Smokeless tobacco: Never Used  Substance and Sexual Activity  . Alcohol use: Yes    Comment: occasionally  . Drug use: No  . Sexual activity: Not on file  Lifestyle  .  Physical activity    Days per week: Not on file    Minutes per session: Not on file  . Stress: Not on file  Relationships  . Social Herbalist on phone: Not on file    Gets together: Not on file    Attends religious service: Not on file    Active member of club or organization: Not on file    Attends meetings of clubs or organizations: Not on file    Relationship status: Not on file  . Intimate partner violence    Fear of current or ex partner: Not on file    Emotionally abused: Not on file    Physically abused: Not on file    Forced sexual activity: Not on file  Other Topics Concern  . Not on file  Social History Narrative  . Not on file      Family History  Problem Relation Age of Onset  . Heart disease Other   . Diabetes Other   . Breast cancer Other   . Hypertension Mother   . Cancer Father 26       prostate  . Heart disease Father   . Heart attack Father   . Cancer Brother 48       prostate  . Cancer Maternal Aunt 60       kidney  . Cancer Maternal Grandfather 50       stomach  . Hypertension Brother      Current Outpatient Medications on File Prior to Visit  Medication Sig Dispense Refill  . ALPRAZolam (XANAX) 1 MG tablet Take 1 tablet by mouth daily as needed.    Marland Kitchen amLODipine (NORVASC) 5 MG tablet Take 1 tablet (5 mg total) by mouth daily. 30 tablet 3  . cholecalciferol (VITAMIN D) 1000 UNITS tablet Take 1,000 Units by mouth daily.    . diclofenac sodium (VOLTAREN) 1 % GEL Apply 2 g topically 4 (four) times daily. 1 Tube 0  . docusate sodium (COLACE) 100 MG capsule Take 100 mg by mouth daily as needed for mild constipation.    . fluticasone (FLONASE) 50 MCG/ACT nasal spray Place 2 sprays into both nostrils daily as needed.    . hydrochlorothiazide (MICROZIDE) 12.5 MG capsule Take 12.5 mg by mouth daily.     . phentermine (ADIPEX-P) 37.5 MG tablet Take 1 tablet by mouth daily.    . pravastatin (PRAVACHOL) 40 MG tablet Take 40 mg by mouth daily. PM    . traZODone (DESYREL) 50 MG tablet Take 50 mg by mouth at bedtime.     No current facility-administered medications on file prior to visit.     Cardiovascular studies:  Echocardiogram 07/01/2019: Left ventricle cavity is normal in size. Moderate concentric hypertrophy of the left ventricle. Normal LV systolic function with EF 56%. Normal global wall motion. Doppler evidence of grade I (impaired) diastolic dysfunction, normal LAP. Calculated EF 56%. Mild mitral valve leaflet thickening. Trace mitral regurgitation. Mild tricuspid regurgitation. Estimated pulmonary artery systolic pressure is 32 mmHg.  Compared to previous study on 0000000, Mild diastolic  dysfunction and mild pulmonary hypertension is new.   EKG 06/24/2019: Probable sinus rhythm 64 bpm.   Early R wave transition. Low voltage in precordial leads.    Lexiscan myoview stress test 09/05/2017:  1. Pharmacologic stress testing was performed with intravenous administration of .4 mg of Lexiscan over a 10-15 seconds infusion. Stress EKG is non diagnostic for ischemia as it is a pharmacologic stress.  2.  The overall quality of the study is excellent. There is no evidence of abnormal lung activity. Stress and rest SPECT images demonstrate homogeneous tracer distribution throughout the myocardium. Gated SPECT imaging reveals normal myocardial thickening and wall motion. The left ventricular ejection fraction was normal (81%).   3. This is a low risk study.  Echocardiogram 01/21/2017: Left ventricle cavity is normal in size. Normal global wall motion. Visual EF is 50-55%. Normal diastolic filling pattern. Trileaflet aortic valve with no regurgitation noted. Mild aortic valve leaflet calcification. No evidence of aortic valve stenosis. The aortic root is normal. Mild atherosclerotic changes in the aorta. Compared to the study done on 01/31/2016, mild pulmonary hypertension is no longer present.  Recent labs: Results for TASHARRA, HULETTE (MRN [id number]) as of 06/24/2019 09:27  Ref. Range 08/11/2018 13:31  Sodium Latest Ref Range: 135 - 145 mmol/L 140  Potassium Latest Ref Range: 3.5 - 5.1 mmol/L 4.1  Chloride Latest Ref Range: 98 - 111 mmol/L 105  CO2 Latest Ref Range: 22 - 32 mmol/L 24  Glucose Latest Ref Range: 70 - 99 mg/dL 89  BUN Latest Ref Range: 8 - 23 mg/dL 19  Creatinine Latest Ref Range: 0.44 - 1.00 mg/dL 0.87  Calcium Latest Ref Range: 8.9 - 10.3 mg/dL 9.6  Anion gap Latest Ref Range: 5 - 15  11  Alkaline Phosphatase Latest Ref Range: 38 - 126 U/L 75  Albumin Latest Ref Range: 3.5 - 5.0 g/dL 4.3  AST Latest Ref Range: 15 - 41 U/L 24  ALT Latest Ref Range: 0 - 44 U/L  25  Total Protein Latest Ref Range: 6.5 - 8.1 g/dL 8.1  Total Bilirubin Latest Ref Range: 0.3 - 1.2 mg/dL 0.4  GFR, Est Non African American Latest Ref Range: >60 mL/min >60  GFR, Est African American Latest Ref Range: >60 mL/min >60   Results for SIMAYA, KRAMER (MRN [id number]) as of 06/24/2019 09:27  Ref. Range 08/11/2018 13:31  WBC Latest Ref Range: 4.0 - 10.5 K/uL 7.7  RBC Latest Ref Range: 3.87 - 5.11 MIL/uL 4.31  Hemoglobin Latest Ref Range: 12.0 - 15.0 g/dL 13.1  HCT Latest Ref Range: 36.0 - 46.0 % 39.4  MCV Latest Ref Range: 80.0 - 100.0 fL 91.4  MCH Latest Ref Range: 26.0 - 34.0 pg 30.4  MCHC Latest Ref Range: 30.0 - 36.0 g/dL 33.2  RDW Latest Ref Range: 11.5 - 15.5 % 14.4  Platelets Latest Ref Range: 150 - 400 K/uL 324  nRBC Latest Ref Range: 0.0 - 0.2 % 0.0    Review of Systems  Constitution: Negative for decreased appetite, malaise/fatigue, weight gain and weight loss.  HENT: Negative for congestion.   Eyes: Negative for visual disturbance.  Cardiovascular: Positive for dyspnea on exertion. Negative for chest pain, leg swelling, palpitations and syncope.  Respiratory: Positive for snoring. Negative for cough.   Endocrine: Negative for cold intolerance.  Hematologic/Lymphatic: Does not bruise/bleed easily.  Skin: Negative for itching and rash.  Musculoskeletal: Negative for myalgias.  Gastrointestinal: Negative for abdominal pain, nausea and vomiting.  Genitourinary: Negative for dysuria.  Neurological: Negative for dizziness and weakness.  Psychiatric/Behavioral: The patient is not nervous/anxious.   All other systems reviewed and are negative.      Vitals not available.   Objective:   Physical Exam  Not performed. Telephone visit.        Assessment & Recommendations:   68 y/o [name] American female female with hypertension, hyperlipidemia, prediabetes, hypothyroidism, h/o breast cancer.  Mild exertional  dyspnea: Due to combination of obesity,  hypertension, mild TR, now improving. Continue blood pressure control.   Hypertension: Continue hydrochlorothiazide 12.5 mg daily, amlodipine 5 mg daily. Sleep study pending.  Primary risk prevention: Continue statin.  Awaiting results of lipid panel recently performed by PCP.   Obesity: Recommend daily exercise with at least 30 min walking or 15 min high intensity exercise most days a week.  F/u in 6 months.  Nigel Mormon, MD [facility] Pager: [phone] Office: [phone] If no answer Cell [phone]

## 2019-07-08 ENCOUNTER — Other Ambulatory Visit: Payer: Self-pay | Admitting: Internal Medicine

## 2019-07-23 ENCOUNTER — Ambulatory Visit: Payer: Medicare Other | Admitting: Cardiology

## 2019-08-09 ENCOUNTER — Ambulatory Visit: Payer: Medicare Other | Admitting: Hematology and Oncology

## 2019-08-09 ENCOUNTER — Encounter: Payer: Self-pay | Admitting: Family Medicine

## 2019-08-09 ENCOUNTER — Ambulatory Visit: Payer: Medicare Other | Admitting: Clinical

## 2019-08-09 ENCOUNTER — Ambulatory Visit: Payer: Medicare Other | Admitting: Family Medicine

## 2019-08-09 ENCOUNTER — Other Ambulatory Visit (HOSPITAL_COMMUNITY)
Admission: RE | Admit: 2019-08-09 | Discharge: 2019-08-09 | Disposition: A | Discharge status: Home / Self Care | Payer: Medicare Other | Source: Ambulatory Visit | Attending: Family Medicine | Admitting: Family Medicine

## 2019-08-09 ENCOUNTER — Other Ambulatory Visit: Payer: Self-pay

## 2019-08-09 VITALS — Wt 194.6 lb

## 2019-08-09 DIAGNOSIS — Z124 Encounter for screening for malignant neoplasm of cervix: Secondary | ICD-10-CM | POA: Diagnosis present

## 2019-08-09 DIAGNOSIS — F4323 Adjustment disorder with mixed anxiety and depressed mood: Secondary | ICD-10-CM

## 2019-08-09 DIAGNOSIS — M810 Age-related osteoporosis without current pathological fracture: Secondary | ICD-10-CM

## 2019-08-09 DIAGNOSIS — Z1331 Encounter for screening for depression: Secondary | ICD-10-CM

## 2019-08-09 DIAGNOSIS — Z01419 Encounter for gynecological examination (general) (routine) without abnormal findings: Secondary | ICD-10-CM

## 2019-08-09 DIAGNOSIS — Z113 Encounter for screening for infections with a predominantly sexual mode of transmission: Secondary | ICD-10-CM

## 2019-08-09 NOTE — BH Specialist Note (Signed)
  Integrated Behavioral Health Initial Visit  MRN: [id number] Name: TRISHANA HANAN  Number of [facility] Clinician visits:: 1/6 Session Start time: 5:06  Session End time: 5:19 Total time: 15  Type of Service: [place] Interpretor:No. Interpretor Name and Language: n/a   Warm Hand Off Completed.       SUBJECTIVE: Elaine Simon is a 68 y.o. female accompanied by n/a Patient was referred by Clayton Lefort, MD for positive depression/anxiety screen. Patient reports the following symptoms/concerns: Pt states her primary concern today is request to learn self-coping strategy for anxiety.  Duration of problem: Ongoing; Severity of problem: moderately severe  OBJECTIVE: Mood: Anxious and Affect: Appropriate Risk of harm to self or others: No plan to harm self or others  LIFE CONTEXT: Family and Social: - School/Work: - Self-Care: - Life Changes: -  GOALS ADDRESSED: Patient will: 1. Reduce symptoms of: anxiety, depression and insomnia 2. Increase knowledge and/or ability of: coping skills  3. Demonstrate ability to: Increase healthy adjustment to current life circumstances  INTERVENTIONS: Interventions utilized: Mindfulness or Relaxation Training  Standardized Assessments completed: GAD-7 and PHQ 9  ASSESSMENT: Patient currently experiencing Adjustment disorder with mixed anxiety and depression   Patient may benefit from psychoeducation and brief therapeutic interventions regarding coping with symptoms of anxiety and depression .  PLAN: 1. Follow up with behavioral health clinician on : As needed  2. Behavioral recommendations:  -CALM relaxation breathing exercise twice daily (morning; at bedtime with sleep sounds) -Read educational materials regarding coping with symptoms of depression, anxiety, and panic attacks  3. Referral(s): Painter (In Clinic) 4. "From scale of  1-10, how likely are you to follow plan?": -  Garlan Fair, LCSW   Depression screen [facility] 2/9 08/09/2019  Decreased Interest 1  Down, Depressed, Hopeless 2  PHQ - 2 Score 3  Altered sleeping 1  Tired, decreased energy 2  Change in appetite 3  Feeling bad or failure about yourself  3  Trouble concentrating 3  Moving slowly or fidgety/restless 0  Suicidal thoughts 0  PHQ-9 Score 15   GAD 7 : Generalized Anxiety Score 08/09/2019  Nervous, Anxious, on Edge 2  Control/stop worrying 3  Worry too much - different things 2  Trouble relaxing 2  Restless 0  Easily annoyed or irritable 3  Afraid - awful might happen 3  Total GAD 7 Score 15

## 2019-08-09 NOTE — Progress Notes (Signed)
GYNECOLOGY OFFICE VISIT NOTE  History:   Elaine Simon is a 68 y.o. No obstetric history on file. here today for annual gyn visit.  Would like pap smear Reports no history of abnormal pap smear  Requests breast exam Is scheduled for breast MRI in 09/2019 Follows regularly with breast clinic Would like breast exam  Wondering about bone density scan  Significant anxiety Denies SI Would like to see Elaine Simon Has done psychotherapy as well, told she may have PTSD due to "incident"  Past Medical History:  Diagnosis Date  . Anxiety   . Breast cancer ([place]) 1995  . Cataract immature    bilat.  . Depression   . Hay fever   . Hx of mitral valve prolapse    has had no problems  . Hyperlipidemia   . Hypertension   . Hypothyroidism   . Insomnia   . Osteoarthritis   . Panic attacks   . Personal history of radiation therapy 1995  . Snores    states occ. apnea, occ. wakes up coughing; has never had sleep study;  denies daytime sleepiness    Past Surgical History:  Procedure Laterality Date  . BREAST LUMPECTOMY Left 1995   left  . BREAST RECONSTRUCTION  04/01/2012   Procedure: BREAST RECONSTRUCTION;  Surgeon: Theodoro Kos, DO;  Location: [facility];  Service: Plastics;  Laterality: Right;  Right breast mastopexy reduction   . GANGLION CYST EXCISION    . LYMPH NODE DISSECTION    . TONSILLECTOMY    . UTERINE FIBROID SURGERY      The following portions of the patient's history were reviewed and updated as appropriate: allergies, current medications, past family history, past medical history, past social history, past surgical history and problem list.   Health Maintenance:  Normal pap 06/10/2012.  Normal breast MRI 08/24/2018.   Review of Systems:  Pertinent items noted in HPI and remainder of comprehensive ROS otherwise negative.  Physical Exam:  Wt 194 lb 9.6 oz (88.3 kg)   BMI 35.59 kg/m  CONSTITUTIONAL: Well-developed, well-nourished female in no  acute distress.  HEENT:  Normocephalic, atraumatic. External right and left ear normal. No scleral icterus.  NECK: Normal range of motion, supple, no masses noted on observation SKIN: No rash noted. Not diaphoretic. No erythema. No pallor. MUSCULOSKELETAL: Normal range of motion. No edema noted. NEUROLOGIC: Alert and oriented to person, place, and time. Normal muscle tone coordination.  PSYCHIATRIC: Depressed mood, normal affect. Normal behavior. Normal judgment and thought content. RESPIRATORY: Effort normal, no problems with respiration noted ABDOMEN: No masses noted. No other overt distention noted.  BREAST: L breast with area of rectangular mass just superior to nipple, no discharge from nipple and no axillary LN palpated. R breast with mobile firm induration just superior to nipple, no discharge, no axillary LN palpated.  PELVIC: atrophic vaginal mucosa, otherwise normal, cervix normal  Labs and Imaging Results for orders placed or performed in visit on 08/09/19 (from the past 168 hour(s))  HIV antibody (with reflex)   Collection Time: 08/09/19  6:06 PM  Result Value Ref Range   HIV Screen 4th Generation wRfx Non Reactive Non Reactive  RPR   Collection Time: 08/09/19  6:06 PM  Result Value Ref Range   RPR Ser Ql Non Reactive Non Reactive   No results found.    Assessment and Plan:    Pap NILM 06/10/2012, reports no prior history of abnormal paps. Repeat obtained today Follows regularly w breast clinic.  Breast exam abnormal, however notes area of concern in L breast as are of lumpectomy and in R breast as area of prior plastic surgery. No lymph nodes appreciated. Follows regularly with breast clinic, has MRI already scheduled for 10/08/2019, reassured patient findings are not likely to represent malignancy but imaging is already scheduled.  STI screening: accepts today HIV/RPR as no lifetime screening on file Mood is depressed, anxious, accepts referral to [facility]. Not interested in SSRI  at present. Due for repeat DEXA, ordered and instructed to follow up on results w PCP.  Problem List Items Addressed This Visit    None    Visit Diagnoses    Positive depression screening    -  Primary   Relevant Orders   Ambulatory referral to [facility] for cervical cancer       Relevant Orders   Cytology - PAP( [facility])   Age-related osteoporosis without current pathological fracture       Relevant Orders   DG DXA FRACTURE ASSESSMENT   Screening examination for sexually transmitted disease       Relevant Orders   HIV antibody (with reflex) (Completed)   RPR (Completed)      Routine preventative health maintenance measures emphasized. Please refer to After Visit Summary for other counseling recommendations.   Return in about 1 year (around 08/08/2020) for Annual Wellness Visit.    Total face-to-face time with patient: 20 minutes.  Over 50% of encounter was spent on counseling and coordination of care.   Elaine Simon, [place] for [employer], [place]

## 2019-08-10 ENCOUNTER — Institutional Professional Consult (permissible substitution): Payer: Self-pay | Admitting: Neurology

## 2019-08-10 ENCOUNTER — Telehealth: Payer: Self-pay | Admitting: Neurology

## 2019-08-10 LAB — HIV ANTIBODY (ROUTINE TESTING W REFLEX): HIV Screen 4th Generation wRfx: NONREACTIVE

## 2019-08-10 LAB — RPR: RPR Ser Ql: NONREACTIVE

## 2019-08-10 NOTE — Telephone Encounter (Signed)
Please follow dismissal protocol as per our No Show Policy.   

## 2019-08-10 NOTE — Telephone Encounter (Signed)
Pt was scheduled for sleep consult with Dr. Rexene Alberts on 06/30/19. Pt no showed to that appt. On 08/02/19 pt called me requesting to schedule her sleep consult. I informed the pt that she no showed to her original appt however, I could reschedule her. Pt stated, "that she did not schedule any appt so I must have spoke to someone else". As I was rescheduling the pt's appt I verified with her about her prior sleep study that was documented in my notes. Pt replied, "that is correct if that is what I told you before." Also, the pt asked, "what was our cleaning process for the sleep lab." I told the pt that we have increased our cleaning and we have to follow the cleaning and use of certain cleaning products set by [facility]. Pt stated, "she understood that but, she wanted a email of the products we use." The sleep lab manager gave me this information and I emailed it to the pt as she requested. Pt called me on 08/10/19 around 10:54 am stating, "she need to reschedule her appt because, she has bible study every Tuesday." Also, the pt stated, "that she doesn't know if I picked this appt time or if she did because, she would never pick this time due to bible study." I informed the pt that I quote my first available appt time however, the pt is the one that actually picks the appt day and time. Pt requested to be rescheduled. I informed her I would have to reach out to Dr. Rexene Alberts due to the office's no show policy. Pt verbalized understanding.

## 2019-08-10 NOTE — Telephone Encounter (Signed)
This is pt's second new patient no-show. Per [facility] policy she is eligible for dismissal.

## 2019-08-10 NOTE — Telephone Encounter (Signed)
I have added the dismissal flag to pt's chart. Please send the certified dismissal letter.

## 2019-08-11 LAB — CYTOLOGY - PAP
Chlamydia: NEGATIVE
Comment: NEGATIVE
Comment: NORMAL
Diagnosis: NEGATIVE
Neisseria Gonorrhea: NEGATIVE

## 2019-08-12 ENCOUNTER — Telehealth: Payer: Self-pay | Admitting: Family Medicine

## 2019-08-12 NOTE — Telephone Encounter (Signed)
Please call patient (no mychart) and let her know that her tests from her visit earlier this week were all normal, including her pap smear.

## 2019-08-16 ENCOUNTER — Ambulatory Visit: Payer: Medicare Other | Admitting: Hematology and Oncology

## 2019-08-16 NOTE — Telephone Encounter (Signed)
Attempted to call patient about Normal results, No answer Line kept ringing then went to a dial tone.

## 2019-08-23 ENCOUNTER — Ambulatory Visit: Payer: Medicare Other | Admitting: Hematology and Oncology

## 2019-10-01 ENCOUNTER — Ambulatory Visit
Admission: RE | Admit: 2019-10-01 | Discharge: 2019-10-01 | Disposition: A | Discharge status: Home / Self Care | Payer: Medicare Other | Source: Ambulatory Visit | Attending: Hematology and Oncology | Admitting: Hematology and Oncology

## 2019-10-01 ENCOUNTER — Other Ambulatory Visit: Payer: Self-pay

## 2019-10-01 DIAGNOSIS — Z853 Personal history of malignant neoplasm of breast: Secondary | ICD-10-CM

## 2019-10-07 ENCOUNTER — Other Ambulatory Visit: Payer: Self-pay | Admitting: Hematology and Oncology

## 2019-10-07 DIAGNOSIS — R928 Other abnormal and inconclusive findings on diagnostic imaging of breast: Secondary | ICD-10-CM

## 2019-10-08 ENCOUNTER — Ambulatory Visit
Admission: RE | Admit: 2019-10-08 | Discharge: 2019-10-08 | Disposition: A | Discharge status: Home / Self Care | Payer: Medicare Other | Source: Ambulatory Visit | Attending: Hematology and Oncology | Admitting: Hematology and Oncology

## 2019-10-08 ENCOUNTER — Other Ambulatory Visit: Payer: Self-pay

## 2019-10-08 DIAGNOSIS — R928 Other abnormal and inconclusive findings on diagnostic imaging of breast: Secondary | ICD-10-CM

## 2019-10-08 DIAGNOSIS — Z1231 Encounter for screening mammogram for malignant neoplasm of breast: Secondary | ICD-10-CM

## 2019-10-08 MED ORDER — GADOBUTROL 1 MMOL/ML IV SOLN
10.0000 mL | Freq: Once | INTRAVENOUS | Status: AC | PRN
  Administered 2019-10-08: 10 mL via INTRAVENOUS

## 2019-10-10 ENCOUNTER — Ambulatory Visit: Payer: Medicare Other | Attending: Internal Medicine

## 2019-10-10 DIAGNOSIS — Z23 Encounter for immunization: Secondary | ICD-10-CM

## 2019-10-11 ENCOUNTER — Telehealth: Payer: Self-pay | Admitting: Hematology and Oncology

## 2019-10-11 NOTE — Telephone Encounter (Signed)
I called the patient to give the results of the breast MRI which was normal.  However her voice mailbox is full and could not take any more messages.

## 2019-10-11 NOTE — Progress Notes (Signed)
   Covid-19 Vaccination Clinic  Name:  ROBEN FRERICH    MRN: [id number] DOB: [birth year]  10/10/2019  Ms. Lybarger was observed post Covid-19 immunization for 15 minutes without incidence. She was provided with Vaccine Information Sheet and instruction to access the V-Safe system.   Ms. Boshell was instructed to call 911 with any severe reactions post vaccine: Marland Kitchen Difficulty breathing  . Swelling of your face and throat  . A fast heartbeat  . A bad rash all over your body  . Dizziness and weakness    Immunizations Administered    Name Date Dose VIS Date Route   Moderna COVID-19 Vaccine 10/10/2019  5:00 PM 0.5 mL 08/17/2019 Intramuscular   Manufacturer: Levan Hurst   LotAR:[id number]   [id number]      Documented on behalf of:  C. Hubbard

## 2019-10-21 ENCOUNTER — Other Ambulatory Visit: Payer: Self-pay | Admitting: Cardiology

## 2019-10-21 DIAGNOSIS — I10 Essential (primary) hypertension: Secondary | ICD-10-CM

## 2019-11-07 ENCOUNTER — Ambulatory Visit: Payer: Medicare Other | Attending: Internal Medicine

## 2019-11-07 DIAGNOSIS — Z23 Encounter for immunization: Secondary | ICD-10-CM

## 2019-11-07 NOTE — Progress Notes (Signed)
   Covid-19 Vaccination Clinic  Name:  Elaine Simon    MRN: [id number] DOB: May 10, 1951  11/07/2019  [name] was observed post Covid-19 immunization for 15 minutes without incidence. She was provided with Vaccine Information Sheet and instruction to access the V-Safe system.   [name] was instructed to call 911 with any severe reactions post vaccine: Marland Kitchen Difficulty breathing  . Swelling of your face and throat  . A fast heartbeat  . A bad rash all over your body  . Dizziness and weakness    Immunizations Administered    Name Date Dose VIS Date Route   Moderna COVID-19 Vaccine 11/07/2019  1:39 PM 0.5 mL 08/17/2019 Intramuscular   Manufacturer: Moderna   Lot: [id number]   [id number]

## 2020-01-07 ENCOUNTER — Other Ambulatory Visit: Payer: Self-pay

## 2020-01-07 ENCOUNTER — Encounter: Payer: Self-pay | Admitting: Cardiology

## 2020-01-07 ENCOUNTER — Ambulatory Visit: Payer: Medicare Other | Admitting: Cardiology

## 2020-01-07 VITALS — BP 133/81 | HR 75 | Temp 98.0°F | Resp 16 | Ht 62.0 in | Wt 197.8 lb

## 2020-01-07 DIAGNOSIS — I10 Essential (primary) hypertension: Secondary | ICD-10-CM

## 2020-01-07 DIAGNOSIS — E782 Mixed hyperlipidemia: Secondary | ICD-10-CM

## 2020-01-07 NOTE — Progress Notes (Signed)
Follow up visit  Subjective:   Elaine Simon, female    DOB: [birth year], 69 y.o.   MRN: [id number]    HPI  Chief Complaint  Patient presents with  . Hypertension  . Mitral Regurgitation  . Follow-up    6 month    69 y.o. African Amrican female with h/o hypertension, hyperlipidemia, prediabetes, hypothyroidism, h/o breast cancer.  Patient denies chest pain, shortness of breath, palpitations, leg edema, orthopnea, PND, TIA/syncope. She is worried about her cardiac risk, given her family history.   Current Outpatient Medications on File Prior to Visit  Medication Sig Dispense Refill  . ALPRAZolam (XANAX) 1 MG tablet Take 1 tablet by mouth daily as needed.    Marland Kitchen amLODipine (NORVASC) 5 MG tablet TAKE 1 TABLET(5 MG) BY MOUTH DAILY 30 tablet 3  . cholecalciferol (VITAMIN D) 1000 UNITS tablet Take 1,000 Units by mouth daily.    . diclofenac sodium (VOLTAREN) 1 % GEL Apply 2 g topically 4 (four) times daily. 1 Tube 0  . docusate sodium (COLACE) 100 MG capsule Take 100 mg by mouth daily as needed for mild constipation.    . fluticasone (FLONASE) 50 MCG/ACT nasal spray Place 2 sprays into both nostrils daily as needed.    . hydrochlorothiazide (MICROZIDE) 12.5 MG capsule Take 12.5 mg by mouth daily.     . phentermine (ADIPEX-P) 37.5 MG tablet Take 1 tablet by mouth daily.    . pravastatin (PRAVACHOL) 40 MG tablet Take 40 mg by mouth daily. PM    . traZODone (DESYREL) 50 MG tablet Take 50 mg by mouth at bedtime.    . meloxicam (MOBIC) 15 MG tablet Take 1 tablet by mouth daily.     No current facility-administered medications on file prior to visit.    Cardiovascular & other pertient studies:  EKG 01/07/2020: Sinus rhythm 65 bpm. Left anterior fascicular block.   Echocardiogram 07/01/2019: Left ventricle cavity is normal in size. Moderate concentric hypertrophy of the left ventricle. Normal LV systolic function with EF 56%. Normal global wall motion. Doppler evidence of  grade I (impaired) diastolic dysfunction, normal LAP. Calculated EF 56%. Mild mitral valve leaflet thickening. Trace mitral regurgitation. Mild tricuspid regurgitation. Estimated pulmonary artery systolic pressure is 32 mmHg.  Compared to previous study on 03/18/4286, Mild diastolic dysfunction and mild pulmonary hypertension is new.   Vasc US Lower Extremity 05/25/2018: No DVT.  Lexiscan myoview stress test 09/05/2017:  1. Pharmacologic stress testing was performed with intravenous administration of .4 mg of Lexiscan over a 10-15 seconds infusion. Stress EKG is non diagnostic for ischemia as it is a pharmacologic stress.  2. The overall quality of the study is excellent. There is no evidence of abnormal lung activity. Stress and rest SPECT images demonstrate homogeneous tracer distribution throughout the myocardium. Gated SPECT imaging reveals normal myocardial thickening and wall motion. The left ventricular ejection fraction was normal (81%).   3. This is a low risk study.  Sleep Study 07/07/2016: Nocturnal Hypoxemia. Primary Snoring.  Recent labs: 08/11/2018: Glucose 89, BUN/Cr 19/0.87. EGFR >60. Na/K 140/4.1. Rest of the CMP normal H/H 13.1/39.4. MCV 91.4. Platelets 324  03/2010: HbA1C 5.4%, TSH 3.9 Normal   Review of Systems  Cardiovascular: Negative for chest pain, dyspnea on exertion, leg swelling, palpitations and syncope.       Vitals:   01/07/20 1345 01/07/20 1346  BP: 123/71 133/81  Pulse: 72 75  Resp:    Temp:    SpO2:  Body mass index is 36.18 kg/m. Filed Weights   01/07/20 1336  Weight: 197 lb 12.8 oz (89.7 kg)     Objective:   Physical Exam  Constitutional:  Mildly Obese   Neck: No JVD present.  Cardiovascular: Normal rate, regular rhythm and intact distal pulses.  No murmur heard. Pulmonary/Chest: Effort normal and breath sounds normal. She has no wheezes. She has no rales.          Assessment & Recommendations:   69 y/o [name]  American female female with hypertension, hyperlipidemia, prediabetes, hypothyroidism, h/o breast cancer.  Hypertension: Controlled  Hyperlipidemia, risk stratification: Check lipid panel, calcium score scan  F/u in 6 months.    Elaine Mormon, MD [facility] [place] Pager: [phone] Office: [phone]

## 2020-02-28 ENCOUNTER — Other Ambulatory Visit: Payer: Self-pay | Admitting: Cardiology

## 2020-02-28 DIAGNOSIS — I10 Essential (primary) hypertension: Secondary | ICD-10-CM

## 2020-03-13 ENCOUNTER — Other Ambulatory Visit (HOSPITAL_COMMUNITY): Payer: Self-pay | Admitting: Cardiology

## 2020-03-14 LAB — LIPID PANEL W/O CHOL/HDL RATIO
Cholesterol, Total: 204 mg/dL — ABNORMAL HIGH (ref 100–199)
HDL: 81 mg/dL (ref >39)
LDL Chol Calc (NIH): 103 mg/dL — ABNORMAL HIGH (ref 0–99)
Triglycerides: 114 mg/dL (ref 0–149)
VLDL Cholesterol Cal: 20 mg/dL (ref 5–40)

## 2020-03-17 NOTE — Progress Notes (Signed)
Called patient, I tried to give the results and is requesting you call her instead. I did advise her that could possibly be Tues before she may hear from you and she said that would be fine.

## 2020-03-21 NOTE — Progress Notes (Signed)
Called No answer.

## 2020-03-23 ENCOUNTER — Telehealth: Payer: Self-pay | Admitting: Cardiology

## 2020-03-23 DIAGNOSIS — R0609 Other forms of dyspnea: Secondary | ICD-10-CM

## 2020-03-23 DIAGNOSIS — R0600 Dyspnea, unspecified: Secondary | ICD-10-CM

## 2020-03-23 DIAGNOSIS — R931 Abnormal findings on diagnostic imaging of heart and coronary circulation: Secondary | ICD-10-CM | POA: Insufficient documentation

## 2020-03-23 MED ORDER — ASPIRIN EC 81 MG PO TBEC: 81.0000 mg | DELAYED_RELEASE_TABLET | Freq: Every day | ORAL | 3 refills | Status: DC

## 2020-03-23 MED ORDER — METOPROLOL TARTRATE 100 MG PO TABS: 100.0000 mg | ORAL_TABLET | ORAL | 0 refills | Status: DC

## 2020-03-23 MED ORDER — PRAVASTATIN SODIUM 80 MG PO TABS: 80.0000 mg | ORAL_TABLET | Freq: Every day | ORAL | 2 refills | Status: DC

## 2020-03-23 NOTE — Telephone Encounter (Signed)
Discuss calcium score findings at length with the patient.  At score of 41, I would recommend risk factor modification.  This would include consideration for aspirin and high intensity statin.  Patient currently takes Mobic.  I have recommended that she stop Mobic and start aspirin 81 mg daily, in order to not increase her overall bleeding risk.  We discussed starting rosuvastatin or atorvastatin.  However, given patient's concerns for side effects, we have decided to increase pravastatin to 80 mg daily.  In addition, patient is very concerned about her elevated calcium score given her family history of coronary artery disease.  On further questioning, she does report exertional dyspnea symptoms.  Therefore, I will proceed with obtaining coronary CT angiogram testing.  Telephone encounter 15 min  Cumings, MD [facility] Pager: [phone] Office: [phone]

## 2020-03-23 NOTE — Progress Notes (Signed)
Dr. Fraser Din spoke with patient.

## 2020-03-24 NOTE — Telephone Encounter (Signed)
Contacting insurance now, will sch CCTA.   Leda Quail

## 2020-04-10 ENCOUNTER — Telehealth (HOSPITAL_COMMUNITY): Payer: Self-pay | Admitting: *Deleted

## 2020-04-10 NOTE — Telephone Encounter (Signed)
Reaching out to patient to offer assistance regarding upcoming cardiac imaging study; pt verbalizes understanding of appt date/time, parking situation and where to check in, pre-test NPO status and medications ordered, and verified current allergies; name and call back number provided for further questions should they arise  [facility] [phone] office [phone] cell  Per pt request, instructions emailed to pt.

## 2020-04-12 ENCOUNTER — Other Ambulatory Visit: Payer: Self-pay

## 2020-04-12 ENCOUNTER — Ambulatory Visit (HOSPITAL_COMMUNITY)
Admission: RE | Admit: 2020-04-12 | Discharge: 2020-04-12 | Disposition: A | Discharge status: Home / Self Care | Payer: Medicare Other | Source: Ambulatory Visit | Attending: Cardiology | Admitting: Cardiology

## 2020-04-12 DIAGNOSIS — R0609 Other forms of dyspnea: Secondary | ICD-10-CM

## 2020-04-12 DIAGNOSIS — I700 Atherosclerosis of aorta: Secondary | ICD-10-CM | POA: Diagnosis not present

## 2020-04-12 DIAGNOSIS — R931 Abnormal findings on diagnostic imaging of heart and coronary circulation: Secondary | ICD-10-CM | POA: Diagnosis present

## 2020-04-12 DIAGNOSIS — R0600 Dyspnea, unspecified: Secondary | ICD-10-CM | POA: Diagnosis present

## 2020-04-12 DIAGNOSIS — I2510 Atherosclerotic heart disease of native coronary artery without angina pectoris: Secondary | ICD-10-CM | POA: Diagnosis not present

## 2020-04-12 LAB — BASIC METABOLIC PANEL
BUN/Creatinine Ratio: 14 (ref 12–28)
BUN: 13 mg/dL (ref 8–27)
CO2: 24 mmol/L (ref 20–29)
Calcium: 10 mg/dL (ref 8.7–10.3)
Chloride: 100 mmol/L (ref 96–106)
Creatinine, Ser: 0.9 mg/dL (ref 0.57–1.00)
GFR calc Af Amer: 76 mL/min/{1.73_m2} (ref >59)
GFR calc non Af Amer: 66 mL/min/{1.73_m2} (ref >59)
Glucose: 119 mg/dL — ABNORMAL HIGH (ref 65–99)
Potassium: 4.6 mmol/L (ref 3.5–5.2)
Sodium: 138 mmol/L (ref 134–144)

## 2020-04-12 MED ORDER — NITROGLYCERIN 0.4 MG SL SUBL
SUBLINGUAL_TABLET | SUBLINGUAL | Status: AC
  Filled 2020-04-12: qty 2

## 2020-04-12 MED ORDER — NITROGLYCERIN 0.4 MG SL SUBL: 0.8000 mg | SUBLINGUAL_TABLET | SUBLINGUAL | Status: DC | PRN

## 2020-04-12 MED ORDER — IOHEXOL 350 MG/ML SOLN
80.0000 mL | Freq: Once | INTRAVENOUS | Status: AC | PRN
  Administered 2020-04-12: 80 mL via INTRAVENOUS

## 2020-04-13 NOTE — Progress Notes (Signed)
Minimal disease in LAD and Cx and very mild coronary calcium score of 30.2. Continue primary prevention. Discuss on OV

## 2020-05-25 ENCOUNTER — Other Ambulatory Visit: Payer: Self-pay | Admitting: Cardiology

## 2020-05-25 DIAGNOSIS — I10 Essential (primary) hypertension: Secondary | ICD-10-CM

## 2020-06-19 NOTE — Progress Notes (Signed)
Patient Care Team: Nolene Ebbs, MD as PCP - General (Internal Medicine) Carol Ada, MD as Consulting Physician (Gastroenterology) Foye Spurling, MD (Inactive) as Consulting Physician (Internal Medicine) Ena Dawley, MD as Consulting Physician (Obstetrics and Gynecology)  DIAGNOSIS:    ICD-10-CM   1. NEOPLASM, MALIGNANT, BREAST, HX OF  Z85.3 MR BREAST BILATERAL W WO CONTRAST INC CAD    CHIEF COMPLIANT: Follow-up of breast cancer  INTERVAL HISTORY: Elaine Simon is a 69 y.o. with above-mentioned history of breast cancer 25 years ago. Breast MRI on 10/08/19 showed no evidence of malignancy bilaterally. She presents to the clinic today for annual follow-up.   She reports no new health issues or problems.  She has noticed that the right breast is larger than the left.  Scar tissue in the left breast is the same as before.  ALLERGIES:  is allergic to adhesive [tape] and codeine.  MEDICATIONS:  Current Outpatient Medications  Medication Sig Dispense Refill  . ALPRAZolam (XANAX) 1 MG tablet Take 1 tablet by mouth daily as needed.    Marland Kitchen amLODipine (NORVASC) 5 MG tablet TAKE 1 TABLET(5 MG) BY MOUTH DAILY 30 tablet 6  . aspirin EC 81 MG tablet Take 1 tablet (81 mg total) by mouth daily. Swallow whole. 90 tablet 3  . cholecalciferol (VITAMIN D) 1000 UNITS tablet Take 1,000 Units by mouth daily.    . diclofenac sodium (VOLTAREN) 1 % GEL Apply 2 g topically 4 (four) times daily. 1 Tube 0  . docusate sodium (COLACE) 100 MG capsule Take 100 mg by mouth daily as needed for mild constipation.    . fluticasone (FLONASE) 50 MCG/ACT nasal spray Place 2 sprays into both nostrils daily as needed.    . hydrochlorothiazide (MICROZIDE) 12.5 MG capsule Take 12.5 mg by mouth daily.     . metoprolol tartrate (LOPRESSOR) 100 MG tablet Take 1 tablet (100 mg total) by mouth as directed. Take 1 tablet 2 hours before CT scan 2 tablet 0  . phentermine (ADIPEX-P) 37.5 MG tablet Take 1 tablet by  mouth daily.    . pravastatin (PRAVACHOL) 80 MG tablet Take 1 tablet (80 mg total) by mouth daily. PM 30 tablet 2  . traZODone (DESYREL) 50 MG tablet Take 50 mg by mouth at bedtime.     No current facility-administered medications for this visit.    PHYSICAL EXAMINATION: ECOG PERFORMANCE STATUS: 1 - Symptomatic but completely ambulatory  Vitals:   06/20/20 1419  BP: 122/61  Pulse: 76  Resp: 18  Temp: 97.9 F (36.6 C)  SpO2: 100%   Filed Weights   06/20/20 1419  Weight: 196 lb 1.6 oz (89 kg)    BREAST: No palpable masses or nodules in either right or left breasts.  There is scar tissue palpated in the periareolar aspect of the left breast.  This is unchanged from before.  No palpable axillary supraclavicular or infraclavicular adenopathy no breast tenderness or nipple discharge. (exam performed in the presence of a chaperone)  LABORATORY DATA:  I have reviewed the data as listed CMP Latest Ref Rng & Units 04/11/2020 08/11/2018 11/10/2017  Glucose 65 - 99 mg/dL 119(H) 89 102  BUN 8 - 27 mg/dL 13 19 21   Creatinine 0.57 - 1.00 mg/dL 0.90 0.87 1.17(H)  Sodium 134 - 144 mmol/L 138 140 138  Potassium 3.5 - 5.2 mmol/L 4.6 4.1 4.0  Chloride 96 - 106 mmol/L 100 105 104  CO2 20 - 29 mmol/L 24 24 24   Calcium 8.7 -  10.3 mg/dL 10.0 9.6 10.5(H)  Total Protein 6.5 - 8.1 g/dL - 8.1 8.1  Total Bilirubin 0.3 - 1.2 mg/dL - 0.4 0.3  Alkaline Phos 38 - 126 U/L - 75 71  AST 15 - 41 U/L - 24 20  ALT 0 - 44 U/L - 25 31    Lab Results  Component Value Date   WBC 7.7 08/11/2018   HGB 13.1 08/11/2018   HCT 39.4 08/11/2018   MCV 91.4 08/11/2018   PLT 324 08/11/2018   NEUTROABS 4.7 08/11/2018    ASSESSMENT & PLAN:  NEOPLASM, MALIGNANT, BREAST, HX OF Stage 1 left breast cancer 1995 treated with lumpectomy and 19 node left axillary dissection, chemotherapy (possibly 6 cycles adriamycin combination) and radiation. Declined tamoxifen, was on Evista discontinued 2010  Breast cancer  surveillance: 1.Mammogram 05/19/2018: Benign breast density category D 2.Breast exam 08/11/2018: Benign  Osteopenia: Last bone density 2016 showed a T score of -2.2, previously 2013 it was -3.3. She is currently not taking any Evista.  Based upon high density breast tissue and the palpable nodularity from scar tissue, I recommended that she undergo breast MRI    Breast MRI 10/08/2019: Benign breast density category C Next breast MRI will be scheduled for July 2022 Mammogram 10/06/2019: Right breast possible asymmetry which led to ultrasound: Benign breast density category C  Return to clinic in 1 year for follow-up    Orders Placed This Encounter  Procedures  . MR BREAST BILATERAL W WO CONTRAST INC CAD    Standing Status:   Future    Standing Expiration Date:   06/20/2021    Order Specific Question:   If indicated for the ordered procedure, I authorize the administration of contrast media per Radiology protocol    Answer:   Yes    Order Specific Question:   What is the patient's sedation requirement?    Answer:   No Sedation    Order Specific Question:   Does the patient have a pacemaker or implanted devices?    Answer:   No    Order Specific Question:   Preferred imaging location?    Answer:   [facility] (table limit-550lbs)    Order Specific Question:   Release to patient    Answer:   Immediate   The patient has a good understanding of the overall plan. she agrees with it. she will call with any problems that may develop before the next visit here.  Total time spent: 20 mins including face to face time and time spent for planning, charting and coordination of care  Nicholas Lose, MD 06/20/2020  I, Cloyde Reams Dorshimer, am acting as scribe for Dr. Nicholas Lose.  I have reviewed the above documentation for accuracy and completeness, and I agree with the above.

## 2020-06-20 ENCOUNTER — Inpatient Hospital Stay: Payer: Medicare Other | Attending: Hematology and Oncology | Admitting: Hematology and Oncology

## 2020-06-20 ENCOUNTER — Other Ambulatory Visit: Payer: Self-pay

## 2020-06-20 DIAGNOSIS — Z888 Allergy status to other drugs, medicaments and biological substances status: Secondary | ICD-10-CM | POA: Insufficient documentation

## 2020-06-20 DIAGNOSIS — Z885 Allergy status to narcotic agent status: Secondary | ICD-10-CM | POA: Diagnosis not present

## 2020-06-20 DIAGNOSIS — Z853 Personal history of malignant neoplasm of breast: Secondary | ICD-10-CM | POA: Diagnosis present

## 2020-06-20 DIAGNOSIS — Z79899 Other long term (current) drug therapy: Secondary | ICD-10-CM | POA: Diagnosis not present

## 2020-06-20 DIAGNOSIS — M8580 Other specified disorders of bone density and structure, unspecified site: Secondary | ICD-10-CM | POA: Diagnosis not present

## 2020-06-20 NOTE — Assessment & Plan Note (Signed)
Stage 1 left breast cancer 1995 treated with lumpectomy and 19 node left axillary dissection, chemotherapy (possibly 6 cycles adriamycin combination) and radiation. Declined tamoxifen, was on Evista discontinued 2010  Breast cancer surveillance: 1.Mammogram 05/19/2018: Benign breast density category D 2.Breast exam 08/11/2018: Benign  Osteopenia: Last bone density 2016 showed a T score of -2.2, previously 2013 it was -3.3. She is currently not taking any Evista.  Based upon high density breast tissue and the palpable nodularity from scar tissue, I recommended that she undergo breast MRI every couple of years.   Breast MRI 10/08/2019: Benign breast density category C Mammogram 10/06/2019: Right breast possible asymmetry which led to ultrasound: Benign breast density category C  Return to clinic in 1 year for follow-up

## 2020-06-22 ENCOUNTER — Telehealth: Payer: Self-pay | Admitting: Hematology and Oncology

## 2020-06-22 NOTE — Telephone Encounter (Signed)
Scheduled appointment per 10/05 LOS - left vm for patient

## 2020-07-06 ENCOUNTER — Ambulatory Visit: Payer: Medicare Other | Admitting: Cardiology

## 2020-08-24 DIAGNOSIS — H9113 Presbycusis, bilateral: Secondary | ICD-10-CM | POA: Insufficient documentation

## 2020-08-24 DIAGNOSIS — H9313 Tinnitus, bilateral: Secondary | ICD-10-CM | POA: Insufficient documentation

## 2020-08-24 DIAGNOSIS — K219 Gastro-esophageal reflux disease without esophagitis: Secondary | ICD-10-CM | POA: Insufficient documentation

## 2020-09-26 ENCOUNTER — Other Ambulatory Visit: Payer: Self-pay | Admitting: Internal Medicine

## 2020-09-27 DIAGNOSIS — L909 Atrophic disorder of skin, unspecified: Secondary | ICD-10-CM | POA: Insufficient documentation

## 2020-09-27 DIAGNOSIS — R400 Somnolence: Secondary | ICD-10-CM | POA: Insufficient documentation

## 2020-09-27 LAB — URINE CULTURE
MICRO NUMBER:: 11404785
SPECIMEN QUALITY:: ADEQUATE

## 2020-09-27 LAB — COMPLETE METABOLIC PANEL WITH GFR
AG Ratio: 1.5 (calc) (ref 1.0–2.5)
ALT: 48 U/L — ABNORMAL HIGH (ref 6–29)
AST: 45 U/L — ABNORMAL HIGH (ref 10–35)
Albumin: 4.5 g/dL (ref 3.6–5.1)
Alkaline phosphatase (APISO): 76 U/L (ref 37–153)
BUN: 19 mg/dL (ref 7–25)
CO2: 26 mmol/L (ref 20–32)
Calcium: 9.8 mg/dL (ref 8.6–10.4)
Chloride: 101 mmol/L (ref 98–110)
Creat: 0.91 mg/dL (ref 0.50–0.99)
GFR, Est African American: 75 mL/min/{1.73_m2} (ref >=60)
GFR, Est Non African American: 64 mL/min/{1.73_m2} (ref >=60)
Globulin: 3 g/dL (calc) (ref 1.9–3.7)
Glucose, Bld: 113 mg/dL — ABNORMAL HIGH (ref 65–99)
Potassium: 3.9 mmol/L (ref 3.5–5.3)
Sodium: 138 mmol/L (ref 135–146)
Total Bilirubin: 0.5 mg/dL (ref 0.2–1.2)
Total Protein: 7.5 g/dL (ref 6.1–8.1)

## 2020-09-27 LAB — LIPID PANEL
Cholesterol: 183 mg/dL (ref <200)
HDL: 74 mg/dL (ref >=50)
LDL Cholesterol (Calc): 88 mg/dL (calc)
Non-HDL Cholesterol (Calc): 109 mg/dL (calc) (ref <130)
Total CHOL/HDL Ratio: 2.5 (calc) (ref <5.0)
Triglycerides: 117 mg/dL (ref <150)

## 2020-09-27 LAB — CBC
HCT: 39.7 % (ref 35.0–45.0)
Hemoglobin: 13 g/dL (ref 11.7–15.5)
MCH: 29.4 pg (ref 27.0–33.0)
MCHC: 32.7 g/dL (ref 32.0–36.0)
MCV: 89.8 fL (ref 80.0–100.0)
MPV: 9.9 fL (ref 7.5–12.5)
Platelets: 295 10*3/uL (ref 140–400)
RBC: 4.42 10*6/uL (ref 3.80–5.10)
RDW: 12.9 % (ref 11.0–15.0)
WBC: 6 10*3/uL (ref 3.8–10.8)

## 2020-09-27 LAB — TSH: TSH: 3.53 mIU/L (ref 0.40–4.50)

## 2020-09-27 LAB — T4, FREE: Free T4: 1 ng/dL (ref 0.8–1.8)

## 2020-09-27 LAB — VITAMIN D 25 HYDROXY (VIT D DEFICIENCY, FRACTURES): Vit D, 25-Hydroxy: 92 ng/mL (ref 30–100)

## 2020-10-05 ENCOUNTER — Encounter: Payer: Self-pay | Admitting: Cardiology

## 2020-10-05 ENCOUNTER — Other Ambulatory Visit: Payer: Self-pay

## 2020-10-05 ENCOUNTER — Ambulatory Visit: Payer: Medicare Other | Admitting: Cardiology

## 2020-10-05 VITALS — BP 112/80 | HR 88 | Ht 62.0 in | Wt 203.0 lb

## 2020-10-05 DIAGNOSIS — F32A Depression, unspecified: Secondary | ICD-10-CM | POA: Insufficient documentation

## 2020-10-05 DIAGNOSIS — R011 Cardiac murmur, unspecified: Secondary | ICD-10-CM

## 2020-10-05 DIAGNOSIS — I2510 Atherosclerotic heart disease of native coronary artery without angina pectoris: Secondary | ICD-10-CM | POA: Diagnosis not present

## 2020-10-05 DIAGNOSIS — E785 Hyperlipidemia, unspecified: Secondary | ICD-10-CM | POA: Insufficient documentation

## 2020-10-05 DIAGNOSIS — M1990 Unspecified osteoarthritis, unspecified site: Secondary | ICD-10-CM | POA: Insufficient documentation

## 2020-10-05 DIAGNOSIS — E782 Mixed hyperlipidemia: Secondary | ICD-10-CM

## 2020-10-05 DIAGNOSIS — F410 Panic disorder [episodic paroxysmal anxiety] without agoraphobia: Secondary | ICD-10-CM | POA: Insufficient documentation

## 2020-10-05 DIAGNOSIS — E669 Obesity, unspecified: Secondary | ICD-10-CM

## 2020-10-05 DIAGNOSIS — F419 Anxiety disorder, unspecified: Secondary | ICD-10-CM | POA: Insufficient documentation

## 2020-10-05 DIAGNOSIS — G4700 Insomnia, unspecified: Secondary | ICD-10-CM | POA: Insufficient documentation

## 2020-10-05 DIAGNOSIS — I10 Essential (primary) hypertension: Secondary | ICD-10-CM

## 2020-10-05 DIAGNOSIS — J301 Allergic rhinitis due to pollen: Secondary | ICD-10-CM | POA: Insufficient documentation

## 2020-10-05 DIAGNOSIS — Z8679 Personal history of other diseases of the circulatory system: Secondary | ICD-10-CM | POA: Insufficient documentation

## 2020-10-05 DIAGNOSIS — R0683 Snoring: Secondary | ICD-10-CM | POA: Insufficient documentation

## 2020-10-05 DIAGNOSIS — E039 Hypothyroidism, unspecified: Secondary | ICD-10-CM | POA: Insufficient documentation

## 2020-10-05 NOTE — Progress Notes (Signed)
Cardiology Office Note:    Date:  10/05/2020   ID:  Elaine Simon, DOB Aug 16, 1951, MRN [id number]  PCP:  Nolene Ebbs, MD  Cardiologist:  Berniece Salines, DO  Electrophysiologist:  None   Referring MD: Nolene Ebbs, MD   Have shortness of breath  History of Present Illness:    Elaine Simon is a 70 y.o. female with a hx of minimal coronary artery disease based on a coronary CTA which was done in July 2021, hypertension, hyperlipidemia, prediabetes, hypothyroidism history of breast cancer is here to establish cardiac care.  The patient previously followed with [facility] cardiology and based on information in care everywhere she was seen on January 07, 2020.  During that visit she appeared to be stable from a cardiovascular standpoint.  She has had a really good work-up from her previous cardiology.  The patient is here today to establish cardiac care.  She has multiple questions.  She wanted to understand what cardiac enzyme is, what a stress test is, calcium scoring as well as the meaning of plaque rupture.  Patient tells me that she does have some shortness of breath on exertion especially when she is going up and down her stairs.  Concerned about this as she also has been gaining some weight recently.  Past Medical History:  Diagnosis Date  . Anxiety   . Breast cancer ([place]) 1995  . Cataract immature    bilat.  . Depression   . Hay fever   . Hx of mitral valve prolapse    has had no problems  . Hyperlipidemia   . Hypertension   . Hypothyroidism   . Insomnia   . Osteoarthritis   . Panic attacks   . Personal history of radiation therapy 1995  . Snores    states occ. apnea, occ. wakes up coughing; has never had sleep study;  denies daytime sleepiness    Past Surgical History:  Procedure Laterality Date  . BREAST LUMPECTOMY Left 1995   left  . BREAST RECONSTRUCTION  04/01/2012   Procedure: BREAST RECONSTRUCTION;  Surgeon: Theodoro Kos, DO;  Location: [place];  Service: Plastics;  Laterality: Right;  Right breast mastopexy reduction   . GANGLION CYST EXCISION    . LYMPH NODE DISSECTION    . REDUCTION MAMMAPLASTY Bilateral   . TONSILLECTOMY    . UTERINE FIBROID SURGERY      Current Medications: Current Meds  Medication Sig  . ALPRAZolam (XANAX) 1 MG tablet Take 1 tablet by mouth daily as needed.  Marland Kitchen amLODipine (NORVASC) 5 MG tablet TAKE 1 TABLET(5 MG) BY MOUTH DAILY  . aspirin EC 81 MG tablet Take 1 tablet (81 mg total) by mouth daily. Swallow whole.  . cholecalciferol (VITAMIN D) 1000 UNITS tablet Take 1,000 Units by mouth daily.  . diclofenac sodium (VOLTAREN) 1 % GEL Apply 2 g topically 4 (four) times daily.  Marland Kitchen docusate sodium (COLACE) 100 MG capsule Take 100 mg by mouth daily as needed for mild constipation.  Marland Kitchen escitalopram (LEXAPRO) 10 MG tablet Take 10 mg by mouth daily.  . famotidine (PEPCID) 40 MG tablet Take 40 mg by mouth in the morning and at bedtime.  . fluticasone (FLONASE) 50 MCG/ACT nasal spray Place 2 sprays into both nostrils daily as needed.  . hydrochlorothiazide (MICROZIDE) 12.5 MG capsule Take 12.5 mg by mouth daily.   Marland Kitchen losartan (COZAAR) 25 MG tablet Take 25 mg by mouth daily.  . meloxicam (MOBIC) 15 MG tablet Take 1  tablet by mouth daily.  . phentermine (ADIPEX-P) 37.5 MG tablet Take 1 tablet by mouth daily.  . rosuvastatin (CRESTOR) 40 MG tablet Take 40 mg by mouth daily.  . traZODone (DESYREL) 150 MG tablet Take 150 mg by mouth at bedtime as needed.  . Vitamin D, Ergocalciferol, (DRISDOL) 1.25 MG (50000 UNIT) CAPS capsule Take 1 capsule by mouth once a week.  . [DISCONTINUED] metoprolol tartrate (LOPRESSOR) 100 MG tablet Take 1 tablet (100 mg total) by mouth as directed. Take 1 tablet 2 hours before CT scan  . [DISCONTINUED] pravastatin (PRAVACHOL) 80 MG tablet Take 1 tablet (80 mg total) by mouth daily. PM     Allergies:   Adhesive [tape] and Codeine   Social History   Socioeconomic History   . Marital status: Single    Spouse name: Not on file  . Number of children: 0  . Years of education: Not on file  . Highest education level: Not on file  Occupational History  . Not on file  Tobacco Use  . Smoking status: Never Smoker  . Smokeless tobacco: Never Used  Vaping Use  . Vaping Use: Never used  Substance and Sexual Activity  . Alcohol use: Yes    Comment: occasionally  . Drug use: No  . Sexual activity: Not on file  Other Topics Concern  . Not on file  Social History Narrative  . Not on file   Social Determinants of Health   Financial Resource Strain: Not on file  Food Insecurity: Not on file  Transportation Needs: Not on file  Physical Activity: Not on file  Stress: Not on file  Social Connections: Not on file     Family History: The patient's family history includes Breast cancer in an other family member; Cancer (age of onset: 39) in her brother; Cancer (age of onset: 80) in her maternal grandfather; Cancer (age of onset: 39) in her maternal aunt; Cancer (age of onset: 47) in her father; Diabetes in an other family member; Heart attack in her father; Heart disease in her father and another family member; Hypertension in her brother and mother.  ROS:   Review of Systems  Constitution: Negative for decreased appetite, fever and weight gain.  HENT: Negative for congestion, ear discharge, hoarse voice and sore throat.   Eyes: Negative for discharge, redness, vision loss in right eye and visual halos.  Cardiovascular: Reports shortness of breath.  Negative for chest pain,  leg swelling, orthopnea and palpitations.  Respiratory: Negative for cough, hemoptysis, shortness of breath and snoring.   Endocrine: Negative for heat intolerance and polyphagia.  Hematologic/Lymphatic: Negative for bleeding problem. Does not bruise/bleed easily.  Skin: Negative for flushing, nail changes, rash and suspicious lesions.  Musculoskeletal: Negative for arthritis, joint pain,  muscle cramps, myalgias, neck pain and stiffness.  Gastrointestinal: Negative for abdominal pain, bowel incontinence, diarrhea and excessive appetite.  Genitourinary: Negative for decreased libido, genital sores and incomplete emptying.  Neurological: Negative for brief paralysis, focal weakness, headaches and loss of balance.  Psychiatric/Behavioral: Negative for altered mental status, depression and suicidal ideas.  Allergic/Immunologic: Negative for HIV exposure and persistent infections.    EKGs/Labs/Other Studies Reviewed:    The following studies were reviewed today:   EKG:  The ekg ordered today demonstrates sinus rhythm, heart rate 72 bpm  Coronary CTA impression Aorta:  Normal size.  Mild atherosclerosis.  No dissection.  Aortic Valve:  Trileaflet.  No calcifications.  Coronary Arteries:  Normal coronary origin.  Right dominance.  RCA is a large dominant artery that gives rise to PDA. There is no plaque.  Left main is a large artery that gives rise to LAD and LCX arteries.  LAD is a large vessel that gives rise to 4 very small diagonals; there is minimal (0-24%) calcified stenosis in the proximial vessel.  LCX is a non-dominant artery that gives rise to one large branching OM branch. There is minimal (0-24%) calcified plaque at the takeoff of OM.  Other findings:  Normal pulmonary vein drainage into the left atrium.  Normal let atrial appendage without a thrombus.  Normal size of the pulmonary artery.  IMPRESSION: 1. Coronary calcium score of 30.2. This was 4 percentile for age and sex matched control.  2. Normal coronary origin with right dominance.  3. Minimal (0-24%) nonobstructive CAD as outlined above; CAD-RADS 1.    Echocardiogram 07/01/2019: Left ventricle cavity is normal in size. Moderate concentric hypertrophy of the left ventricle. Normal LV systolic function with EF 56%. Normal global wall motion. Doppler evidence of grade I  (impaired) diastolic dysfunction, normal LAP. Calculated EF 56%. Mild mitral valve leaflet thickening. Trace mitral regurgitation. Mild tricuspid regurgitation. Estimated pulmonary artery systolic pressure is 32 mmHg.  Compared to previous study on 01/21/2017, Mild diastolic dysfunction and mild pulmonary hypertension is new.   Vasc US Lower Extremity 05/25/2018: No DVT.  Lexiscan myoview stress test 09/05/2017:  1. Pharmacologic stress testing was performed with intravenous administration of .4 mg of Lexiscan over a 10-15 seconds infusion. Stress EKG is non diagnostic for ischemia as it is a pharmacologic stress.  2. The overall quality of the study is excellent. There is no evidence of abnormal lung activity. Stress and rest SPECT images demonstrate homogeneous tracer distribution throughout the myocardium. Gated SPECT imaging reveals normal myocardial thickening and wall motion. The left ventricular ejection fraction was normal (81%).   3. This is a low risk study.  Sleep Study 07/07/2016: Nocturnal Hypoxemia. Primary Snoring.    Recent Labs: 09/26/2020: ALT 48; BUN 19; Creat 0.91; Hemoglobin 13.0; Platelets 295; Potassium 3.9; Sodium 138; TSH 3.53  Recent Lipid Panel    Component Value Date/Time   CHOL 183 09/26/2020 1425   CHOL 204 (H) 03/13/2020 1342   TRIG 117 09/26/2020 1425   HDL 74 09/26/2020 1425   HDL 81 03/13/2020 1342   CHOLHDL 2.5 09/26/2020 1425   VLDL 34 03/30/2010 0555   LDLCALC 88 09/26/2020 1425    Physical Exam:    VS:  BP 112/80 (BP Location: Right Arm) Comment (BP Location): CAN NOT DO LEFT ARM  Pulse 88   Ht 5\' 2"  (1.575 m)   Wt 203 lb (92.1 kg)   SpO2 96%   BMI 37.13 kg/m     Wt Readings from Last 3 Encounters:  10/05/20 203 lb (92.1 kg)  06/20/20 196 lb 1.6 oz (89 kg)  01/07/20 197 lb 12.8 oz (89.7 kg)     GEN: Well nourished, well developed in no acute distress HEENT: Normal NECK: No JVD; No carotid bruits LYMPHATICS: No  lymphadenopathy CARDIAC: S1S2 noted,RRR, 2/6 systolic murmurs, rubs, gallops RESPIRATORY:  Clear to auscultation without rales, wheezing or rhonchi  ABDOMEN: Soft, non-tender, non-distended, +bowel sounds, no guarding. EXTREMITIES: No edema, No cyanosis, no clubbing MUSCULOSKELETAL:  No deformity  SKIN: Warm and dry NEUROLOGIC:  Alert and oriented x 3, non-focal PSYCHIATRIC:  Normal affect, good insight  ASSESSMENT:    1. Murmur   2. Essential hypertension   3. Minimal CAD  4. Mixed hyperlipidemia   5. Obesity (BMI 30-39.9)    PLAN:     1.  During the patient visit I was able to spend extra time with her explaining all of the answers to her questions that she had posted about cardiac enzymes, stress test, coronary CTA as well as understanding what this means to have plaque rupture.  I also did go over all of the testing she has done previously with her  Prior cardiologist explained to her that good work-up has been done and giving her the information for her coronary CTA.  With her murmur and her shortness of breath I am going to repeat an echocardiogram because her most recent echocardiogram was in 2020 making sure there is no worsening valvular abnormalities here.  The patient understands the need to lose weight with diet and exercise. We have discussed specific strategies for this.  Blood pressure is acceptable, continue with current antihypertensive regimen which includes  Hyperlipidemia - continue with current statin medication.  The patient is in agreement with the above plan. The patient left the office in stable condition.  The patient will follow up in 6 months or sooner if needed.   Medication Adjustments/Labs and Tests Ordered: Current medicines are reviewed at length with the patient today.  Concerns regarding medicines are outlined above.  Orders Placed This Encounter  Procedures  . EKG 12-Lead  . ECHOCARDIOGRAM COMPLETE   No orders of the defined types were  placed in this encounter.   Patient Instructions   Medication Instructions:  Your physician recommends that you continue on your current medications as directed. Please refer to the Current Medication list given to you today.  *If you need a refill on your cardiac medications before your next appointment, please call your pharmacy*   Lab Work: None If you have labs (blood work) drawn today and your tests are completely normal, you will receive your results only by: Marland Kitchen MyChart Message (if you have MyChart) OR . A paper copy in the mail If you have any lab test that is abnormal or we need to change your treatment, we will call you to review the results.   Testing/Procedures: Your physician has requested that you have an echocardiogram. Echocardiography is a painless test that uses sound waves to create images of your heart. It provides your doctor with information about the size and shape of your heart and how well your heart's chambers and valves are working. This procedure takes approximately one hour. There are no restrictions for this procedure.     Follow-Up: At [facility], you and your health needs are our priority.  As part of our continuing mission to provide you with exceptional heart care, we have created designated Provider Care Teams.  These Care Teams include your primary Cardiologist (physician) and Advanced Practice Providers (APPs -  Physician Assistants and Nurse Practitioners) who all work together to provide you with the care you need, when you need it.  We recommend signing up for the patient portal called "MyChart".  Sign up information is provided on this After Visit Summary.  MyChart is used to connect with patients for Virtual Visits (Telemedicine).  Patients are able to view lab/test results, encounter notes, upcoming appointments, etc.  Non-urgent messages can be sent to your provider as well.   To learn more about what you can do with MyChart, go to  [email or web address].    Your next appointment:   6 month(s)  The format for your next appointment:  In Person  Provider:   Berniece Salines, DO   Other Instructions   Echocardiogram An echocardiogram is a test that uses sound waves (ultrasound) to produce images of the heart. Images from an echocardiogram can provide important information about:  Heart size and shape.  The size and thickness and movement of your heart's walls.  Heart muscle function and strength.  Heart valve function or if you have stenosis. Stenosis is when the heart valves are too narrow.  If blood is flowing backward through the heart valves (regurgitation).  A tumor or infectious growth around the heart valves.  Areas of heart muscle that are not working well because of poor blood flow or injury from a heart attack.  Aneurysm detection. An aneurysm is a weak or damaged part of an artery wall. The wall bulges out from the normal force of blood pumping through the body. Tell a health care provider about:  Any allergies you have.  All medicines you are taking, including vitamins, herbs, eye drops, creams, and over-the-counter medicines.  Any blood disorders you have.  Any surgeries you have had.  Any medical conditions you have.  Whether you are pregnant or may be pregnant. What are the risks? Generally, this is a safe test. However, problems may occur, including an allergic reaction to dye (contrast) that may be used during the test. What happens before the test? No specific preparation is needed. You may eat and drink normally. What happens during the test?  You will take off your clothes from the waist up and put on a hospital gown.  Electrodes or electrocardiogram (ECG)patches may be placed on your chest. The electrodes or patches are then connected to a device that monitors your heart rate and rhythm.  You will lie down on a table for an ultrasound exam. A gel will be applied to your  chest to help sound waves pass through your skin.  A handheld device, called a transducer, will be pressed against your chest and moved over your heart. The transducer produces sound waves that travel to your heart and bounce back (or "echo" back) to the transducer. These sound waves will be captured in real-time and changed into images of your heart that can be viewed on a video monitor. The images will be recorded on a computer and reviewed by your health care provider.  You may be asked to change positions or hold your breath for a short time. This makes it easier to get different views or better views of your heart.  In some cases, you may receive contrast through an IV in one of your veins. This can improve the quality of the pictures from your heart. The procedure may vary among health care providers and hospitals.   What can I expect after the test? You may return to your normal, everyday life, including diet, activities, and medicines, unless your health care provider tells you not to do that. Follow these instructions at home:  It is up to you to get the results of your test. Ask your health care provider, or the department that is doing the test, when your results will be ready.  Keep all follow-up visits. This is important. Summary  An echocardiogram is a test that uses sound waves (ultrasound) to produce images of the heart.  Images from an echocardiogram can provide important information about the size and shape of your heart, heart muscle function, heart valve function, and other possible heart problems.  You do not need to  do anything to prepare before this test. You may eat and drink normally.  After the echocardiogram is completed, you may return to your normal, everyday life, unless your health care provider tells you not to do that. This information is not intended to replace advice given to you by your health care provider. Make sure you discuss any questions you have with  your health care provider. Document Revised: 04/25/2020 Document Reviewed: 04/25/2020 Elsevier Patient Education  2021 [employer].      Adopting a Healthy Lifestyle.  Know what a healthy weight is for you (roughly BMI <25) and aim to maintain this   Aim for 7+ servings of fruits and vegetables daily   65-80+ fluid ounces of water or unsweet tea for healthy kidneys   Limit to max 1 drink of alcohol per day; avoid smoking/tobacco   Limit animal fats in diet for cholesterol and heart health - choose grass fed whenever available   Avoid highly processed foods, and foods high in saturated/trans fats   Aim for low stress - take time to unwind and care for your mental health   Aim for 150 min of moderate intensity exercise weekly for heart health, and weights twice weekly for bone health   Aim for 7-9 hours of sleep daily   When it comes to diets, agreement about the perfect plan isnt easy to find, even among the experts. Experts at the [employer] developed an idea known as the Healthy Eating Plate. Just imagine a plate divided into logical, healthy portions.   The emphasis is on diet quality:   Load up on vegetables and fruits - one-half of your plate: Aim for color and variety, and remember that potatoes dont count.   Go for whole grains - one-quarter of your plate: Whole wheat, barley, wheat berries, quinoa, oats, brown rice, and foods made with them. If you want pasta, go with whole wheat pasta.   Protein power - one-quarter of your plate: Fish, chicken, beans, and nuts are all healthy, versatile protein sources. Limit red meat.   The diet, however, does go beyond the plate, offering a few other suggestions.   Use healthy plant oils, such as olive, canola, soy, corn, sunflower and peanut. Check the labels, and avoid partially hydrogenated oil, which have unhealthy trans fats.   If youre thirsty, drink water. Coffee and tea are good in moderation, but  skip sugary drinks and limit milk and dairy products to one or two daily servings.   The type of carbohydrate in the diet is more important than the amount. Some sources of carbohydrates, such as vegetables, fruits, whole grains, and beans-are healthier than others.   Finally, stay active  Signed, Berniece Salines, DO  10/05/2020 5:26 PM    [facility]

## 2020-10-05 NOTE — Patient Instructions (Signed)
Medication Instructions:  Your physician recommends that you continue on your current medications as directed. Please refer to the Current Medication list given to you today.  *If you need a refill on your cardiac medications before your next appointment, please call your pharmacy*   Lab Work: None If you have labs (blood work) drawn today and your tests are completely normal, you will receive your results only by: . MyChart Message (if you have MyChart) OR . A paper copy in the mail If you have any lab test that is abnormal or we need to change your treatment, we will call you to review the results.   Testing/Procedures: Your physician has requested that you have an echocardiogram. Echocardiography is a painless test that uses sound waves to create images of your heart. It provides your doctor with information about the size and shape of your heart and how well your heart's chambers and valves are working. This procedure takes approximately one hour. There are no restrictions for this procedure.   Follow-Up: At [facility], you and your health needs are our priority.  As part of our continuing mission to provide you with exceptional heart care, we have created designated Provider Care Teams.  These Care Teams include your primary Cardiologist (physician) and Advanced Practice Providers (APPs -  Physician Assistants and Nurse Practitioners) who all work together to provide you with the care you need, when you need it.  We recommend signing up for the patient portal called "MyChart".  Sign up information is provided on this After Visit Summary.  MyChart is used to connect with patients for Virtual Visits (Telemedicine).  Patients are able to view lab/test results, encounter notes, upcoming appointments, etc.  Non-urgent messages can be sent to your provider as well.   To learn more about what you can do with MyChart, go to [email or web address].    Your next appointment:   6  month(s)  The format for your next appointment:   In Person  Provider:   Kardie Tobb, DO   Other Instructions   Echocardiogram An echocardiogram is a test that uses sound waves (ultrasound) to produce images of the heart. Images from an echocardiogram can provide important information about:  Heart size and shape.  The size and thickness and movement of your heart's walls.  Heart muscle function and strength.  Heart valve function or if you have stenosis. Stenosis is when the heart valves are too narrow.  If blood is flowing backward through the heart valves (regurgitation).  A tumor or infectious growth around the heart valves.  Areas of heart muscle that are not working well because of poor blood flow or injury from a heart attack.  Aneurysm detection. An aneurysm is a weak or damaged part of an artery wall. The wall bulges out from the normal force of blood pumping through the body. Tell a health care provider about:  Any allergies you have.  All medicines you are taking, including vitamins, herbs, eye drops, creams, and over-the-counter medicines.  Any blood disorders you have.  Any surgeries you have had.  Any medical conditions you have.  Whether you are pregnant or may be pregnant. What are the risks? Generally, this is a safe test. However, problems may occur, including an allergic reaction to dye (contrast) that may be used during the test. What happens before the test? No specific preparation is needed. You may eat and drink normally. What happens during the test?  You will take off your clothes from   clothes from the waist up and put on a hospital gown.  Electrodes or electrocardiogram (ECG)patches may be placed on your chest. The electrodes or patches are then connected to a device that monitors your heart rate and rhythm.  You will lie down on a table for an ultrasound exam. A gel will be applied to your chest to help sound waves pass through your skin.  A  handheld device, called a transducer, will be pressed against your chest and moved over your heart. The transducer produces sound waves that travel to your heart and bounce back (or "echo" back) to the transducer. These sound waves will be captured in real-time and changed into images of your heart that can be viewed on a video monitor. The images will be recorded on a computer and reviewed by your health care provider.  You may be asked to change positions or hold your breath for a short time. This makes it easier to get different views or better views of your heart.  In some cases, you may receive contrast through an IV in one of your veins. This can improve the quality of the pictures from your heart. The procedure may vary among health care providers and hospitals.   What can I expect after the test? You may return to your normal, everyday life, including diet, activities, and medicines, unless your health care provider tells you not to do that. Follow these instructions at home:  It is up to you to get the results of your test. Ask your health care provider, or the department that is doing the test, when your results will be ready.  Keep all follow-up visits. This is important. Summary  An echocardiogram is a test that uses sound waves (ultrasound) to produce images of the heart.  Images from an echocardiogram can provide important information about the size and shape of your heart, heart muscle function, heart valve function, and other possible heart problems.  You do not need to do anything to prepare before this test. You may eat and drink normally.  After the echocardiogram is completed, you may return to your normal, everyday life, unless your health care provider tells you not to do that. This information is not intended to replace advice given to you by your health care provider. Make sure you discuss any questions you have with your health care provider. Document Revised:  04/25/2020 Document Reviewed: 04/25/2020 Elsevier Patient Education  2021 Elsevier Inc.   

## 2020-10-23 ENCOUNTER — Other Ambulatory Visit: Payer: Self-pay | Admitting: Internal Medicine

## 2020-10-23 DIAGNOSIS — Z1231 Encounter for screening mammogram for malignant neoplasm of breast: Secondary | ICD-10-CM

## 2020-10-26 ENCOUNTER — Ambulatory Visit
Admission: RE | Admit: 2020-10-26 | Discharge: 2020-10-26 | Disposition: A | Discharge status: Home / Self Care | Payer: Medicare Other | Source: Ambulatory Visit | Attending: Internal Medicine | Admitting: Internal Medicine

## 2020-10-26 ENCOUNTER — Other Ambulatory Visit: Payer: Self-pay

## 2020-10-26 DIAGNOSIS — Z1231 Encounter for screening mammogram for malignant neoplasm of breast: Secondary | ICD-10-CM

## 2020-10-31 ENCOUNTER — Other Ambulatory Visit: Payer: Self-pay | Admitting: Cardiology

## 2020-10-31 DIAGNOSIS — I10 Essential (primary) hypertension: Secondary | ICD-10-CM

## 2020-11-01 ENCOUNTER — Ambulatory Visit (HOSPITAL_BASED_OUTPATIENT_CLINIC_OR_DEPARTMENT_OTHER)
Admission: RE | Admit: 2020-11-01 | Discharge: 2020-11-01 | Disposition: A | Discharge status: Home / Self Care | Payer: Medicare Other | Source: Ambulatory Visit | Attending: Cardiology | Admitting: Cardiology

## 2020-11-01 ENCOUNTER — Other Ambulatory Visit: Payer: Self-pay

## 2020-11-01 DIAGNOSIS — R011 Cardiac murmur, unspecified: Secondary | ICD-10-CM | POA: Diagnosis not present

## 2020-11-01 LAB — ECHOCARDIOGRAM COMPLETE
Area-P 1/2: 3.5 cm2
S' Lateral: 2.5 cm

## 2020-11-02 ENCOUNTER — Telehealth: Payer: Self-pay

## 2020-11-02 NOTE — Telephone Encounter (Signed)
Left message on patients voicemail to please return our call.   

## 2020-11-02 NOTE — Telephone Encounter (Signed)
-----   Message from Berniece Salines, DO sent at 11/02/2020  9:34 AM EST ----- Your EF is normal but your heart wall are severely thickened which is due to the hypertension.

## 2020-11-03 ENCOUNTER — Telehealth: Payer: Self-pay

## 2020-11-03 NOTE — Telephone Encounter (Signed)
Left message on patients voicemail to please return our call.   

## 2020-11-03 NOTE — Telephone Encounter (Signed)
-----   Message from Berniece Salines, DO sent at 11/02/2020  9:34 AM EST ----- Your EF is normal but your heart wall are severely thickened which is due to the hypertension.

## 2020-11-06 ENCOUNTER — Telehealth: Payer: Self-pay

## 2020-11-06 NOTE — Telephone Encounter (Signed)
-----   Message from Berniece Salines, DO sent at 11/02/2020  9:34 AM EST ----- Your EF is normal but your heart wall are severely thickened which is due to the hypertension.

## 2020-11-06 NOTE — Telephone Encounter (Signed)
No answer and voicemail is full. I will mail the patient a letter at this time.

## 2020-11-08 ENCOUNTER — Telehealth: Payer: Self-pay | Admitting: Hematology and Oncology

## 2020-11-08 ENCOUNTER — Ambulatory Visit
Admission: RE | Admit: 2020-11-08 | Discharge: 2020-11-08 | Disposition: A | Discharge status: Home / Self Care | Payer: Medicare Other | Source: Ambulatory Visit | Attending: Hematology and Oncology | Admitting: Hematology and Oncology

## 2020-11-08 ENCOUNTER — Other Ambulatory Visit: Payer: Self-pay

## 2020-11-08 DIAGNOSIS — Z853 Personal history of malignant neoplasm of breast: Secondary | ICD-10-CM

## 2020-11-08 MED ORDER — GADOBUTROL 1 MMOL/ML IV SOLN
10.0000 mL | Freq: Once | INTRAVENOUS | Status: AC | PRN
  Administered 2020-11-08: 10 mL via INTRAVENOUS

## 2020-11-08 NOTE — Telephone Encounter (Signed)
I left a voicemail for the patient that the breast MRI is normal.

## 2020-12-11 ENCOUNTER — Ambulatory Visit: Payer: Medicare Other

## 2021-02-14 ENCOUNTER — Other Ambulatory Visit: Payer: Self-pay | Admitting: Internal Medicine

## 2021-02-17 LAB — INFLUENZA A AND B RNA,QUALITATIVE, REAL-TIME RT-PCR
INFLUENZA B RNA,PCR: NOT DETECTED
Influenza A: NOT DETECTED

## 2021-02-17 LAB — SARS-COV-2 RNA,(COVID-19) QUALITATIVE NAAT: SARS CoV2 RNA: DETECTED — AB

## 2021-02-26 ENCOUNTER — Other Ambulatory Visit: Payer: Self-pay | Admitting: Cardiology

## 2021-02-26 DIAGNOSIS — R931 Abnormal findings on diagnostic imaging of heart and coronary circulation: Secondary | ICD-10-CM

## 2021-05-07 ENCOUNTER — Telehealth: Payer: Self-pay

## 2021-05-07 NOTE — Telephone Encounter (Signed)
RN returned call, voicemail box full.

## 2021-06-19 NOTE — Progress Notes (Signed)
Patient Care Team: Nolene Ebbs, MD as PCP - General (Internal Medicine) Berniece Salines, DO as PCP - Cardiology (Cardiology) Carol Ada, MD as Consulting Physician (Gastroenterology) Foye Spurling, MD (Inactive) as Consulting Physician (Internal Medicine) Ena Dawley, MD as Consulting Physician (Obstetrics and Gynecology)  DIAGNOSIS:    ICD-10-CM   1. 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 with above-mentioned history of breast cancer 25 years ago. Breast MRI on 11/08/2020 showed no evidence of malignancy bilaterally. She presents to the clinic today for annual follow-up.  She complains of palpable nodularity in the left breast around the periareolar scar.  She is also concerned about the change in the shape of her left breast especially in the axillary fold area.  ALLERGIES:  is allergic to adhesive [tape] and codeine.  MEDICATIONS:  Current Outpatient Medications  Medication Sig Dispense Refill   ALPRAZolam (XANAX) 1 MG tablet Take 1 tablet by mouth daily as needed.     amLODipine (NORVASC) 5 MG tablet TAKE 1 TABLET(5 MG) BY MOUTH DAILY 30 tablet 6   ASPIRIN LOW DOSE 81 MG EC tablet TAKE 1 TABLET(81 MG) BY MOUTH DAILY. SWALLOW WHOLE 90 tablet 3   cholecalciferol (VITAMIN D) 1000 UNITS tablet Take 1,000 Units by mouth daily.     diclofenac sodium (VOLTAREN) 1 % GEL Apply 2 g topically 4 (four) times daily. 1 Tube 0   docusate sodium (COLACE) 100 MG capsule Take 100 mg by mouth daily as needed for mild constipation.     famotidine (PEPCID) 40 MG tablet Take 40 mg by mouth in the morning and at bedtime.     fluticasone (FLONASE) 50 MCG/ACT nasal spray Place 2 sprays into both nostrils daily as  needed.     hydrochlorothiazide (MICROZIDE) 12.5 MG capsule Take 12.5 mg by mouth daily.      meloxicam (MOBIC) 15 MG tablet Take 1 tablet by mouth daily.     phentermine (ADIPEX-P) 37.5 MG tablet Take 1 tablet by mouth daily.     rosuvastatin (CRESTOR) 40 MG tablet Take 40 mg by mouth daily.     traZODone (DESYREL) 150 MG tablet Take 150 mg by mouth at bedtime as needed.     Vitamin D, Ergocalciferol, (DRISDOL) 1.25 MG (50000 UNIT) CAPS capsule Take 1 capsule by mouth once a week.     No current facility-administered medications for this visit.    PHYSICAL EXAMINATION: ECOG PERFORMANCE STATUS: 1 - Symptomatic but completely ambulatory  Vitals:   06/20/21 1128  BP: 112/76  Pulse: 93  Resp: 18  Temp: (!) 97.5 F (36.4 C)  SpO2: 96%   Filed Weights   06/20/21 1128  Weight: 202 lb 12.8 oz (92 kg)    BREAST: No palpable masses or nodules in either right or left breasts. No palpable axillary supraclavicular or infraclavicular adenopathy no breast tenderness or nipple discharge. (exam performed in the presence of a chaperone)  LABORATORY DATA:  I have reviewed the data as listed CMP Latest Ref Rng & Units 09/26/2020 04/11/2020 08/11/2018  Glucose 65 - 99 mg/dL 113(H) 119(H) 89  BUN 7 - 25 mg/dL 19 13 19   Creatinine 0.50 - 0.99 mg/dL 0.91 0.90 0.87  Sodium 135 - 146 mmol/L  138 138 140  Potassium 3.5 - 5.3 mmol/L 3.9 4.6 4.1  Chloride 98 - 110 mmol/L 101 100 105  CO2 20 - 32 mmol/L 26 24 24   Calcium 8.6 - 10.4 mg/dL 9.8 10.0 9.6  Total Protein 6.1 - 8.1 g/dL 7.5 - 8.1  Total Bilirubin 0.2 - 1.2 mg/dL 0.5 - 0.4  Alkaline Phos 38 - 126 U/L - - 75  AST 10 - 35 U/L 45(H) - 24  ALT 6 - 29 U/L 48(H) - 25    Lab Results  Component Value Date   WBC 6.0 09/26/2020   HGB 13.0 09/26/2020   HCT 39.7 09/26/2020   MCV 89.8 09/26/2020   PLT 295 09/26/2020   NEUTROABS 4.7 08/11/2018    ASSESSMENT & PLAN:  Breast cancer ([place]) Stage 1 left breast cancer 1995 treated with lumpectomy  and 19 node left axillary dissection, chemotherapy (possibly 6 cycles adriamycin combination) and radiation. Declined tamoxifen, was on Evista discontinued 2010   Breast cancer surveillance: 1.  Mammogram 10/31/20: Benign breast density category C 2.  Breast exam 06/20/21: Benign 3. Breast MRI: 11/08/20: Benign, Density C     Osteopenia: Last bone density 2016 showed a T score of -2.2, previously 2013 it was -3.3. She is currently not taking any Evista.   Based upon high density breast tissue and the palpable nodularity from scar tissue, I recommended that she undergo breast MRI every 2-3 years Obesity: We discussed at length about the importance of weight loss.  She will work hard to lose weight when she comes back to see [place].  I sent a dietary referral because she is prediabetic to talk about nutrition.   Return to clinic in 1 year for follow-up    Orders Placed This Encounter  Procedures   US BREAST LTD UNI LEFT INC AXILLA    Standing Status:   Future    Standing Expiration Date:   06/20/2022    Order Specific Question:   Reason for Exam (SYMPTOM  OR DIAGNOSIS REQUIRED)    Answer:   High risj with a nodule around the nipple    Order Specific Question:   Preferred imaging location?    Answer:   [facility]    Order Specific Question:   Release to patient    Answer:   Immediate   Referral to Nutrition and Diabetes Services    Referral Priority:   Routine    Referral Type:   Consultation    Referral Reason:   Specialty Services Required    Number of Visits Requested:   1    The patient has a good understanding of the overall plan. she agrees with it. she will call with any problems that may develop before the next visit here.  Total time spent: 20 mins including face to face time and time spent for planning, charting and coordination of care  Rulon Eisenmenger, MD, MPH 06/20/2021  I, Thana Ates, am acting as scribe for Dr. Nicholas Lose.  I have reviewed the above  documentation for accuracy and completeness, and I agree with the above.

## 2021-06-20 ENCOUNTER — Inpatient Hospital Stay: Payer: Medicare Other | Attending: Hematology and Oncology | Admitting: Hematology and Oncology

## 2021-06-20 ENCOUNTER — Other Ambulatory Visit: Payer: Self-pay

## 2021-06-20 DIAGNOSIS — Z170 Estrogen receptor positive status [ER+]: Secondary | ICD-10-CM

## 2021-06-20 DIAGNOSIS — Z888 Allergy status to other drugs, medicaments and biological substances status: Secondary | ICD-10-CM | POA: Insufficient documentation

## 2021-06-20 DIAGNOSIS — Z885 Allergy status to narcotic agent status: Secondary | ICD-10-CM | POA: Insufficient documentation

## 2021-06-20 DIAGNOSIS — E119 Type 2 diabetes mellitus without complications: Secondary | ICD-10-CM | POA: Insufficient documentation

## 2021-06-20 DIAGNOSIS — Z79899 Other long term (current) drug therapy: Secondary | ICD-10-CM | POA: Insufficient documentation

## 2021-06-20 DIAGNOSIS — C50919 Malignant neoplasm of unspecified site of unspecified female breast: Secondary | ICD-10-CM | POA: Diagnosis not present

## 2021-06-20 DIAGNOSIS — C50912 Malignant neoplasm of unspecified site of left female breast: Secondary | ICD-10-CM | POA: Insufficient documentation

## 2021-06-20 NOTE — Assessment & Plan Note (Signed)
Stage 1 left breast cancer 1995 treated with lumpectomy and 19 node left axillary dissection, chemotherapy (possibly 6 cycles adriamycin combination) and radiation. Declined tamoxifen, was on Evista discontinued 2010  Breast cancer surveillance: 1.Mammogram 10/31/20: Benign breast density category C 2.Breast exam 06/20/21: Benign 3. Breast MRI: 11/08/20: Benign, Density C  Osteopenia: Last bone density 2016 showed a T score of -2.2, previously 2013 it was -3.3. She is currently not taking any Evista.  Based upon high density breast tissue and the palpable nodularity from scar tissue, I recommended that she undergo breast MRI every 2-3 years   Return to clinic in 1 year for follow-up

## 2021-06-22 ENCOUNTER — Other Ambulatory Visit: Payer: Self-pay | Admitting: Hematology and Oncology

## 2021-09-22 ENCOUNTER — Other Ambulatory Visit: Payer: Self-pay | Admitting: Cardiology

## 2021-09-22 DIAGNOSIS — I10 Essential (primary) hypertension: Secondary | ICD-10-CM

## 2021-12-20 ENCOUNTER — Other Ambulatory Visit: Payer: Self-pay | Admitting: Hematology and Oncology

## 2021-12-20 ENCOUNTER — Telehealth: Payer: Self-pay | Admitting: *Deleted

## 2021-12-20 DIAGNOSIS — Z1231 Encounter for screening mammogram for malignant neoplasm of breast: Secondary | ICD-10-CM

## 2021-12-20 NOTE — Telephone Encounter (Signed)
Received VM from pt.  RN attempt x1 to return call.  No answer, LVM for pt to return call to the office.  °

## 2021-12-31 ENCOUNTER — Other Ambulatory Visit: Payer: Self-pay | Admitting: *Deleted

## 2021-12-31 ENCOUNTER — Other Ambulatory Visit: Payer: Self-pay | Admitting: Hematology and Oncology

## 2021-12-31 DIAGNOSIS — C50919 Malignant neoplasm of unspecified site of unspecified female breast: Secondary | ICD-10-CM

## 2021-12-31 DIAGNOSIS — R234 Changes in skin texture: Secondary | ICD-10-CM

## 2021-12-31 DIAGNOSIS — N644 Mastodynia: Secondary | ICD-10-CM

## 2021-12-31 NOTE — Progress Notes (Signed)
Received call from the breast center requesting order for diagnostic mammogram due to recent breast changes.  RN attempt x1 to contact pt,  no answer. Unable to LVM due to VM being full.  ?

## 2022-01-09 ENCOUNTER — Ambulatory Visit
Admission: RE | Admit: 2022-01-09 | Discharge: 2022-01-09 | Disposition: A | Discharge status: Home / Self Care | Payer: Medicare Other | Source: Ambulatory Visit | Attending: Hematology and Oncology | Admitting: Hematology and Oncology

## 2022-01-09 DIAGNOSIS — C50919 Malignant neoplasm of unspecified site of unspecified female breast: Secondary | ICD-10-CM

## 2022-01-09 DIAGNOSIS — R234 Changes in skin texture: Secondary | ICD-10-CM

## 2022-01-09 DIAGNOSIS — N644 Mastodynia: Secondary | ICD-10-CM

## 2022-01-09 HISTORY — DX: Personal history of antineoplastic chemotherapy: Z92.21

## 2022-02-18 ENCOUNTER — Other Ambulatory Visit: Payer: Self-pay | Admitting: Cardiology

## 2022-02-18 DIAGNOSIS — I10 Essential (primary) hypertension: Secondary | ICD-10-CM

## 2022-04-30 ENCOUNTER — Other Ambulatory Visit: Payer: Self-pay | Admitting: Cardiology

## 2022-04-30 DIAGNOSIS — I10 Essential (primary) hypertension: Secondary | ICD-10-CM

## 2022-05-28 ENCOUNTER — Other Ambulatory Visit: Payer: Self-pay | Admitting: Cardiology

## 2022-05-28 DIAGNOSIS — I10 Essential (primary) hypertension: Secondary | ICD-10-CM

## 2022-06-06 ENCOUNTER — Other Ambulatory Visit: Payer: Self-pay | Admitting: Cardiology

## 2022-06-06 DIAGNOSIS — I10 Essential (primary) hypertension: Secondary | ICD-10-CM

## 2022-06-11 ENCOUNTER — Other Ambulatory Visit: Payer: Self-pay | Admitting: Internal Medicine

## 2022-06-12 LAB — BASIC METABOLIC PANEL WITH GFR
BUN: 20 mg/dL (ref 7–25)
CO2: 24 mmol/L (ref 20–32)
Calcium: 9.8 mg/dL (ref 8.6–10.4)
Chloride: 105 mmol/L (ref 98–110)
Creat: 0.98 mg/dL (ref 0.60–1.00)
Glucose, Bld: 85 mg/dL (ref 65–99)
Potassium: 4.7 mmol/L (ref 3.5–5.3)
Sodium: 142 mmol/L (ref 135–146)
eGFR: 62 mL/min/{1.73_m2} (ref >=60)

## 2022-06-12 LAB — HEPATIC FUNCTION PANEL
AG Ratio: 1.6 (calc) (ref 1.0–2.5)
ALT: 19 U/L (ref 6–29)
AST: 21 U/L (ref 10–35)
Albumin: 4.4 g/dL (ref 3.6–5.1)
Alkaline phosphatase (APISO): 71 U/L (ref 37–153)
Bilirubin, Direct: 0.1 mg/dL (ref 0.0–0.2)
Globulin: 2.7 g/dL (calc) (ref 1.9–3.7)
Indirect Bilirubin: 0.3 mg/dL (calc) (ref 0.2–1.2)
Total Bilirubin: 0.4 mg/dL (ref 0.2–1.2)
Total Protein: 7.1 g/dL (ref 6.1–8.1)

## 2022-06-12 LAB — LIPID PANEL
Cholesterol: 175 mg/dL (ref <200)
HDL: 79 mg/dL (ref >=50)
LDL Cholesterol (Calc): 82 mg/dL (calc)
Non-HDL Cholesterol (Calc): 96 mg/dL (calc) (ref <130)
Total CHOL/HDL Ratio: 2.2 (calc) (ref <5.0)
Triglycerides: 68 mg/dL (ref <150)

## 2022-06-12 LAB — TSH: TSH: 2.08 mIU/L (ref 0.40–4.50)

## 2022-06-12 LAB — EXTRA LAV TOP TUBE

## 2022-06-18 NOTE — Progress Notes (Signed)
Patient Care Team: Nolene Ebbs, MD as PCP - General (Internal Medicine) Berniece Salines, DO as PCP - Cardiology (Cardiology) Carol Ada, MD as Consulting Physician (Gastroenterology) Foye Spurling, MD (Inactive) as Consulting Physician (Internal Medicine) Ena Dawley, MD as Consulting Physician (Obstetrics and Gynecology)  DIAGNOSIS: No diagnosis found.  SUMMARY OF ONCOLOGIC HISTORY: Oncology History   No history exists.    CHIEF COMPLIANT: Follow-up of breast cancer    INTERVAL HISTORY: Elaine Simon is a 71 y.o. with above-mentioned history of breast cancer 25 years ago. Breast MRI on 11/08/2020 showed no evidence of malignancy bilaterally. She presents to the clinic today for annual follow-up.    ALLERGIES:  is allergic to adhesive [tape] and codeine.  MEDICATIONS:  Current Outpatient Medications  Medication Sig Dispense Refill   ALPRAZolam (XANAX) 1 MG tablet Take 1 tablet by mouth daily as needed.     amLODipine (NORVASC) 5 MG tablet TAKE 1 TABLET(5 MG) BY MOUTH DAILY 30 tablet 6   ASPIRIN LOW DOSE 81 MG EC tablet TAKE 1 TABLET(81 MG) BY MOUTH DAILY. SWALLOW WHOLE 90 tablet 3   cholecalciferol (VITAMIN D) 1000 UNITS tablet Take 1,000 Units by mouth daily.     diclofenac sodium (VOLTAREN) 1 % GEL Apply 2 g topically 4 (four) times daily. 1 Tube 0   docusate sodium (COLACE) 100 MG capsule Take 100 mg by mouth daily as needed for mild constipation.     famotidine (PEPCID) 40 MG tablet Take 40 mg by mouth in the morning and at bedtime.     fluticasone (FLONASE) 50 MCG/ACT nasal spray Place 2 sprays into both nostrils daily as needed.     hydrochlorothiazide (MICROZIDE) 12.5 MG capsule Take 12.5 mg by mouth daily.      meloxicam (MOBIC) 15 MG tablet Take 1 tablet by mouth daily.     phentermine (ADIPEX-P) 37.5 MG tablet Take 1 tablet by mouth daily.     rosuvastatin (CRESTOR) 40 MG tablet Take 40 mg by mouth daily.     traZODone (DESYREL) 150 MG tablet Take  150 mg by mouth at bedtime as needed.     Vitamin D, Ergocalciferol, (DRISDOL) 1.25 MG (50000 UNIT) CAPS capsule Take 1 capsule by mouth once a week.     No current facility-administered medications for this visit.    PHYSICAL EXAMINATION: ECOG PERFORMANCE STATUS: {CHL ONC ECOG [id number]}  There were no vitals filed for this visit. There were no vitals filed for this visit.  BREAST:*** No palpable masses or nodules in either right or left breasts. No palpable axillary supraclavicular or infraclavicular adenopathy no breast tenderness or nipple discharge. (exam performed in the presence of a chaperone)  LABORATORY DATA:  I have reviewed the data as listed    Latest Ref Rng & Units 06/11/2022    3:58 PM 09/26/2020    2:25 PM 04/11/2020   11:32 AM  CMP  Glucose 65 - 99 mg/dL 85  113  119   BUN 7 - 25 mg/dL '20  19  13   '$ Creatinine 0.60 - 1.00 mg/dL 0.98  0.91  0.90   Sodium 135 - 146 mmol/L 142  138  138   Potassium 3.5 - 5.3 mmol/L 4.7  3.9  4.6   Chloride 98 - 110 mmol/L 105  101  100   CO2 20 - 32 mmol/L '24  26  24   '$ Calcium 8.6 - 10.4 mg/dL 9.8  9.8  10.0   Total Protein 6.1 - 8.1  g/dL 7.1  7.5    Total Bilirubin 0.2 - 1.2 mg/dL 0.4  0.5    AST 10 - 35 U/L 21  45    ALT 6 - 29 U/L 19  48      Lab Results  Component Value Date   WBC 6.0 09/26/2020   HGB 13.0 09/26/2020   HCT 39.7 09/26/2020   MCV 89.8 09/26/2020   PLT 295 09/26/2020   NEUTROABS 4.7 08/11/2018    ASSESSMENT & PLAN:  No problem-specific Assessment & Plan notes found for this encounter.    No orders of the defined types were placed in this encounter.  The patient has a good understanding of the overall plan. she agrees with it. she will call with any problems that may develop before the next visit here. Total time spent: 30 mins including face to face time and time spent for planning, charting and co-ordination of care   Suzzette Righter, [place] 06/18/22    I Gardiner Coins am scribing for  Dr. Lindi Adie  ***

## 2022-06-20 ENCOUNTER — Inpatient Hospital Stay: Payer: Medicare Other | Attending: Hematology and Oncology | Admitting: Hematology and Oncology

## 2022-06-20 ENCOUNTER — Other Ambulatory Visit: Payer: Self-pay

## 2022-06-20 VITALS — BP 132/82 | HR 73 | Temp 97.9°F | Resp 17 | Ht 62.0 in | Wt 169.1 lb

## 2022-06-20 DIAGNOSIS — Z888 Allergy status to other drugs, medicaments and biological substances status: Secondary | ICD-10-CM | POA: Diagnosis not present

## 2022-06-20 DIAGNOSIS — C50919 Malignant neoplasm of unspecified site of unspecified female breast: Secondary | ICD-10-CM | POA: Diagnosis present

## 2022-06-20 DIAGNOSIS — M8580 Other specified disorders of bone density and structure, unspecified site: Secondary | ICD-10-CM | POA: Insufficient documentation

## 2022-06-20 DIAGNOSIS — Z79899 Other long term (current) drug therapy: Secondary | ICD-10-CM | POA: Diagnosis not present

## 2022-06-20 DIAGNOSIS — Z885 Allergy status to narcotic agent status: Secondary | ICD-10-CM | POA: Insufficient documentation

## 2022-06-20 DIAGNOSIS — Z170 Estrogen receptor positive status [ER+]: Secondary | ICD-10-CM | POA: Insufficient documentation

## 2022-06-20 NOTE — Assessment & Plan Note (Addendum)
Stage 1 left breast cancer 1995 treated with lumpectomy and 19 node left axillary dissection, chemotherapy (possibly 6 cycles adriamycin combination) and radiation. Declined tamoxifen, was on Evista discontinued 2010  Breast cancer surveillance: 1.Mammogram  12/20/2021: Benign breast density category C 2.Breast exam  06/20/2022: Benign 3. Breast MRI: 11/08/20: Benign, Density C  Osteopenia: Last bone density 2016 showed a T score of -2.2, previously 2013 it was -3.3. She is currently not taking any Evista.  Based upon high density breast tissue, I recommended doing a breast MRI in September 2024.  She inquired about obtaining CT scans.  I discussed with her that CT scans are to be done on a as needed basis.  She will inform us if she has any symptoms that would require obtaining CT scans.  Return to clinic in 1 year for follow-up

## 2022-06-24 ENCOUNTER — Other Ambulatory Visit: Payer: Self-pay | Admitting: Cardiology

## 2022-06-24 DIAGNOSIS — I10 Essential (primary) hypertension: Secondary | ICD-10-CM

## 2022-12-12 ENCOUNTER — Other Ambulatory Visit: Payer: Self-pay | Admitting: Internal Medicine

## 2022-12-14 LAB — LIPID PANEL
Cholesterol: 202 mg/dL — ABNORMAL HIGH (ref <200)
HDL: 73 mg/dL (ref >=50)
LDL Cholesterol (Calc): 112 mg/dL (calc) — ABNORMAL HIGH
Non-HDL Cholesterol (Calc): 129 mg/dL (calc) (ref <130)
Total CHOL/HDL Ratio: 2.8 (calc) (ref <5.0)
Triglycerides: 76 mg/dL (ref <150)

## 2022-12-14 LAB — CBC
HCT: 37.8 % (ref 35.0–45.0)
Hemoglobin: 12.8 g/dL (ref 11.7–15.5)
MCH: 30.3 pg (ref 27.0–33.0)
MCHC: 33.9 g/dL (ref 32.0–36.0)
MCV: 89.4 fL (ref 80.0–100.0)
MPV: 10 fL (ref 7.5–12.5)
Platelets: 324 10*3/uL (ref 140–400)
RBC: 4.23 10*6/uL (ref 3.80–5.10)
RDW: 12.6 % (ref 11.0–15.0)
WBC: 5.9 10*3/uL (ref 3.8–10.8)

## 2022-12-14 LAB — FOLATE: Folate: 21.8 ng/mL

## 2022-12-14 LAB — VITAMIN B12: Vitamin B-12: 616 pg/mL (ref 200–1100)

## 2022-12-14 LAB — COMPLETE METABOLIC PANEL WITH GFR
AG Ratio: 1.6 (calc) (ref 1.0–2.5)
ALT: 15 U/L (ref 6–29)
AST: 16 U/L (ref 10–35)
Albumin: 4.4 g/dL (ref 3.6–5.1)
Alkaline phosphatase (APISO): 74 U/L (ref 37–153)
BUN: 12 mg/dL (ref 7–25)
CO2: 22 mmol/L (ref 20–32)
Calcium: 9.7 mg/dL (ref 8.6–10.4)
Chloride: 106 mmol/L (ref 98–110)
Creat: 0.85 mg/dL (ref 0.60–1.00)
Globulin: 2.8 g/dL (calc) (ref 1.9–3.7)
Glucose, Bld: 95 mg/dL (ref 65–99)
Potassium: 4.1 mmol/L (ref 3.5–5.3)
Sodium: 141 mmol/L (ref 135–146)
Total Bilirubin: 0.6 mg/dL (ref 0.2–1.2)
Total Protein: 7.2 g/dL (ref 6.1–8.1)
eGFR: 73 mL/min/{1.73_m2} (ref >=60)

## 2022-12-14 LAB — VITAMIN D 25 HYDROXY (VIT D DEFICIENCY, FRACTURES): Vit D, 25-Hydroxy: 148 ng/mL — ABNORMAL HIGH (ref 30–100)

## 2022-12-14 LAB — T4
Free Thyroxine Index: 2.2 (ref 1.4–3.8)
T4, Total: 6.6 ug/dL (ref 5.1–11.9)

## 2022-12-14 LAB — T3: T3, Total: 103 ng/dL (ref 76–181)

## 2022-12-14 LAB — T3 UPTAKE: T3 Uptake: 34 % (ref 22–35)

## 2022-12-14 LAB — TSH: TSH: 2.01 mIU/L (ref 0.40–4.50)

## 2022-12-31 LAB — HM COLONOSCOPY

## 2023-01-29 ENCOUNTER — Other Ambulatory Visit: Payer: Self-pay | Admitting: Hematology and Oncology

## 2023-01-29 DIAGNOSIS — Z1231 Encounter for screening mammogram for malignant neoplasm of breast: Secondary | ICD-10-CM

## 2023-02-18 ENCOUNTER — Ambulatory Visit
Admission: RE | Admit: 2023-02-18 | Discharge: 2023-02-18 | Disposition: A | Discharge status: Home / Self Care | Payer: Medicare Other | Source: Ambulatory Visit | Attending: Hematology and Oncology | Admitting: Hematology and Oncology

## 2023-02-18 ENCOUNTER — Other Ambulatory Visit: Payer: Self-pay | Admitting: Hematology and Oncology

## 2023-02-18 DIAGNOSIS — N644 Mastodynia: Secondary | ICD-10-CM

## 2023-02-18 DIAGNOSIS — Z1231 Encounter for screening mammogram for malignant neoplasm of breast: Secondary | ICD-10-CM

## 2023-02-18 HISTORY — PX: BREAST CYST ASPIRATION: SHX578

## 2023-04-25 ENCOUNTER — Ambulatory Visit: Payer: Medicare Other | Admitting: Cardiology

## 2023-05-06 ENCOUNTER — Ambulatory Visit: Payer: Medicare Other | Admitting: Cardiology

## 2023-05-06 ENCOUNTER — Telehealth: Payer: Self-pay | Admitting: Cardiology

## 2023-05-06 NOTE — Telephone Encounter (Signed)
Returned call to pt. Pt was diagnosed with Covid a week ago. Pt is having SOB at times, chest pain comes and goes (which patient states started before having Covid). Temperature this morning is 96.7 and she is out of batteries to check her BP. Pt is not having any lightheadedness, dizziness, headache or blurred vision. No chest pain or pressure at this moment. Pt has no BP or HR readings to give. She states she has Afib and has had to wear a monitor before. She tested negative for Covid. She tried to make an appt but states she has to wait until October for the first available. Pt states "I can not wait that long and need something more immediate". Pt states she wants to see Dr. Servando Salina. Advised pt this message will be forwarded to the provider and her nurse for recommendation and advice. Pt verbalized understanding.

## 2023-05-06 NOTE — Telephone Encounter (Signed)
Called pt to relay message. No answer an this time. Left message on her machine.

## 2023-05-06 NOTE — Telephone Encounter (Signed)
  Pt c/o of Chest Pain: STAT if active CP, including tightness, pressure, jaw pain, radiating pain to shoulder/upper arm/back, CP unrelieved by Nitro. Symptoms reported of SOB, nausea, vomiting, sweating.  1. Are you having CP right now?   No   2. Are you experiencing any other symptoms (ex. SOB, nausea, vomiting, sweating)?  SOB, chills, temperature of 102 degrees  3. Is your CP continuous or coming and going?   Coming and going  4. Have you taken Nitroglycerin?   No  5. How long have you been experiencing CP?  Started 3-4 weeks ago    6. If NO CP at time of call then end call with telling Pt to call back or call 911 if Chest pain returns prior to return call from triage team.   Patient noted she has COVID.  Patient is concerned she may need to wear a heart monitor.

## 2023-05-07 NOTE — Telephone Encounter (Signed)
Patient ask to be seen sooner than October and tried to schedule a sooner appointment and all offered she response "that doesn't work for me".  She wants to know what to do when she"moves around and gets SOB"  Advised to see the doctor or APP soonest available and if SOB to point of distress, then she needs to go to ED.  She then ask what to do "If I have chest pain like I did before" and again advised she needs to go to ED.  Advised will send this message to scheduling as I am unable to schedule as they will be able to look more in depth to what "will work for her"

## 2023-05-07 NOTE — Telephone Encounter (Signed)
Patient sates she spoke with PCP  and received Paxlovid and she will complete this tomorrow. She sates has had 2 negative tests.  She states she is fine and doesn't need anything but then states she does need to see the doctor .

## 2023-06-16 ENCOUNTER — Telehealth: Payer: Self-pay | Admitting: Hematology and Oncology

## 2023-06-16 ENCOUNTER — Ambulatory Visit: Payer: Medicare Other | Attending: Cardiology | Admitting: Cardiology

## 2023-06-16 NOTE — Telephone Encounter (Signed)
Rescheduled appointment per provider [facility]. Left voicemail with appointment details.

## 2023-06-17 HISTORY — PX: BREAST BIOPSY: SHX20

## 2023-06-18 ENCOUNTER — Encounter: Payer: Self-pay | Admitting: *Deleted

## 2023-06-18 NOTE — Progress Notes (Signed)
MD will be out of office on 10/11 in the afternoon and requesting pt appt to be changed to NP.  Appt changed, RN attempt x1 to contact pt.  No answer, LVM.

## 2023-06-20 ENCOUNTER — Ambulatory Visit
Admission: RE | Admit: 2023-06-20 | Discharge: 2023-06-20 | Disposition: A | Discharge status: Home / Self Care | Payer: Medicare Other | Source: Ambulatory Visit | Attending: Hematology and Oncology

## 2023-06-20 DIAGNOSIS — C50919 Malignant neoplasm of unspecified site of unspecified female breast: Secondary | ICD-10-CM

## 2023-06-20 MED ORDER — GADOPICLENOL 0.5 MMOL/ML IV SOLN
8.0000 mL | Freq: Once | INTRAVENOUS | Status: AC | PRN
  Administered 2023-06-20: 8 mL via INTRAVENOUS

## 2023-06-23 ENCOUNTER — Telehealth: Payer: Self-pay | Admitting: Adult Health

## 2023-06-23 ENCOUNTER — Telehealth: Payer: Self-pay | Admitting: *Deleted

## 2023-06-23 ENCOUNTER — Other Ambulatory Visit: Payer: Self-pay | Admitting: Hematology and Oncology

## 2023-06-23 DIAGNOSIS — C50919 Malignant neoplasm of unspecified site of unspecified female breast: Secondary | ICD-10-CM

## 2023-06-23 DIAGNOSIS — R928 Other abnormal and inconclusive findings on diagnostic imaging of breast: Secondary | ICD-10-CM

## 2023-06-23 DIAGNOSIS — N6310 Unspecified lump in the right breast, unspecified quadrant: Secondary | ICD-10-CM

## 2023-06-23 NOTE — Telephone Encounter (Signed)
Called twice; left patient a message in regards to rescheduled appointment on 10/11 due to provider being out of office that day left patient new appointment times/dates and also callback number if needed for rescheduling

## 2023-06-23 NOTE — Telephone Encounter (Signed)
Left message for a return phone call concerning recent MRI.    Orders for MR bx placed

## 2023-06-25 ENCOUNTER — Inpatient Hospital Stay: Payer: Medicare Other | Admitting: Hematology and Oncology

## 2023-06-26 ENCOUNTER — Ambulatory Visit
Admission: RE | Admit: 2023-06-26 | Discharge: 2023-06-26 | Disposition: A | Discharge status: Home / Self Care | Payer: Medicare Other | Source: Ambulatory Visit | Attending: Hematology and Oncology | Admitting: Hematology and Oncology

## 2023-06-26 ENCOUNTER — Other Ambulatory Visit: Payer: Self-pay | Admitting: Body Imaging

## 2023-06-26 DIAGNOSIS — N6310 Unspecified lump in the right breast, unspecified quadrant: Secondary | ICD-10-CM

## 2023-06-26 DIAGNOSIS — R928 Other abnormal and inconclusive findings on diagnostic imaging of breast: Secondary | ICD-10-CM

## 2023-06-26 DIAGNOSIS — C50919 Malignant neoplasm of unspecified site of unspecified female breast: Secondary | ICD-10-CM

## 2023-06-26 MED ORDER — GADOPICLENOL 0.5 MMOL/ML IV SOLN
8.0000 mL | Freq: Once | INTRAVENOUS | Status: AC | PRN
  Administered 2023-06-26: 8 mL via INTRAVENOUS

## 2023-06-27 ENCOUNTER — Other Ambulatory Visit: Payer: Medicare Other

## 2023-06-27 ENCOUNTER — Inpatient Hospital Stay: Payer: Medicare Other | Admitting: Adult Health

## 2023-06-27 ENCOUNTER — Ambulatory Visit: Payer: Medicare Other | Admitting: Hematology and Oncology

## 2023-06-27 LAB — SURGICAL PATHOLOGY

## 2023-07-01 ENCOUNTER — Inpatient Hospital Stay: Payer: Medicare Other | Attending: Adult Health | Admitting: Adult Health

## 2023-07-01 NOTE — Progress Notes (Deleted)
[facility] Cancer Follow up:    Elaine Contras, MD [address] [place] [address]   DIAGNOSIS: Cancer Staging  No matching staging information was found for the patient.   SUMMARY OF ONCOLOGIC HISTORY: Stage 1 left breast cancer 1995 treated with lumpectomy and 19 node left axillary dissection, chemotherapy (possibly 6 cycles adriamycin combination) and radiation. Declined tamoxifen, was on Evista discontinued 2010   CURRENT THERAPY: Observation breast density category C  INTERVAL HISTORY: Elaine Simon 72 y.o. female returns for   She underwent a bilateral breast mammogram on February 18, 2023 that demonstrated no mammographic evidence of malignancy but a 5 cm complicated cyst in the upper left breast that was aspirated on that day.  Breast MRI on June 20, 2023 that demonstrated a new 0.5 cm enhancing mass in the lower inner right breast and biopsy was recommended.  She underwent right breast needle core biopsy on June 26, 2023 demonstrating benign breast with lobulated nodular stromal fibrosis, focal cystic fat necrosis, negative for microcalcifications, atypia, and carcinoma.   Patient Active Problem List   Diagnosis Date Noted   Minimal CAD 10/05/2020   Murmur 10/05/2020   Obesity (BMI 30-39.9) 10/05/2020   Snores    Panic attacks    Osteoarthritis    Insomnia    Hypothyroidism    Hypertension    Hyperlipidemia    Hx of mitral valve prolapse    Hay fever    Depression    Anxiety    Daytime sleepiness 09/27/2020   Facial skin atrophy 09/27/2020   Laryngopharyngeal reflux (LPR) 08/24/2020   Presbycusis of both ears 08/24/2020   Tinnitus of both ears 08/24/2020   Elevated coronary artery calcium score 03/23/2020   Nonrheumatic tricuspid valve regurgitation 07/07/2019   Snoring 06/24/2019   Exertional dyspnea 06/24/2019   No-show for appointment 06/22/2019   Post-menopausal 11/07/2016   Genetic testing 10/25/2016   Melasma 01/24/2016    Rhytides 01/24/2016   Postoperative breast asymmetry 04/23/2013   Symptomatic mammary hypertrophy 03/27/2012   NEOPLASM, MALIGNANT, BREAST, HX OF 09/30/2008   Mixed hyperlipidemia 09/29/2008   DEPRESSION 09/29/2008   Essential hypertension 09/29/2008   Vaginitis and vulvovaginitis 09/29/2008   Vaginal leukorrhea 09/29/2008   INSOMNIA 09/29/2008   Personal history of radiation therapy 1995   Breast cancer (HCC) 1995    is allergic to adhesive [tape] and codeine.  MEDICAL HISTORY: Past Medical History:  Diagnosis Date   Anxiety    Breast cancer (HCC) 1995   Cataract immature    bilat.   Depression    Hay fever    Hx of mitral valve prolapse    has had no problems   Hyperlipidemia    Hypertension    Hypothyroidism    Insomnia    Osteoarthritis    Panic attacks    Personal history of chemotherapy    Personal history of radiation therapy 1995   Snores    states occ. apnea, occ. wakes up coughing; has never had sleep study;  denies daytime sleepiness    SURGICAL HISTORY: Past Surgical History:  Procedure Laterality Date   BREAST CYST ASPIRATION Left 02/18/2023   abscess   BREAST LUMPECTOMY Left 1995   left   BREAST RECONSTRUCTION  04/01/2012   Procedure: BREAST RECONSTRUCTION;  Surgeon: Wayland Denis, DO;  Location: [facility];  Service: Plastics;  Laterality: Right;  Right breast mastopexy reduction    GANGLION CYST EXCISION     LYMPH NODE DISSECTION  REDUCTION MAMMAPLASTY Bilateral    TONSILLECTOMY     UTERINE FIBROID SURGERY      SOCIAL HISTORY: Social History   Socioeconomic History   Marital status: Single    Spouse name: Not on file   Number of children: 0   Years of education: Not on file   Highest education level: Not on file  Occupational History   Not on file  Tobacco Use   Smoking status: Never   Smokeless tobacco: Never  Vaping Use   Vaping status: Never Used  Substance and Sexual Activity   Alcohol use: Yes     Comment: occasionally   Drug use: No   Sexual activity: Not on file  Other Topics Concern   Not on file  Social History Narrative   Not on file   Social Determinants of Health   Financial Resource Strain: Not on file  Food Insecurity: Not on file  Transportation Needs: Not on file  Physical Activity: Not on file  Stress: Not on file  Social Connections: Unknown (01/25/2022)   Received from [facility], [facility]   Social Network    Social Network: Not on file  Intimate Partner Violence: Unknown (12/18/2021)   Received from [facility], [facility]   HITS    Physically Hurt: Not on file    Insult or Talk Down To: Not on file    Threaten Physical Harm: Not on file    Scream or Curse: Not on file    FAMILY HISTORY: Family History  Problem Relation Age of Onset   Heart disease Other    Diabetes Other    Breast cancer Other    Hypertension Mother    Cancer Father 18       prostate   Heart disease Father    Heart attack Father    Cancer Brother 47       prostate   Cancer Maternal Aunt 60       kidney   Cancer Maternal Grandfather 50       stomach   Hypertension Brother     Review of Systems  Constitutional:  Negative for appetite change, chills, fatigue, fever and unexpected weight change.  HENT:   Negative for hearing loss, lump/mass and trouble swallowing.   Eyes:  Negative for eye problems and icterus.  Respiratory:  Negative for chest tightness, cough and shortness of breath.   Cardiovascular:  Negative for chest pain, leg swelling and palpitations.  Gastrointestinal:  Negative for abdominal distention, abdominal pain, constipation, diarrhea, nausea and vomiting.  Endocrine: Negative for hot flashes.  Genitourinary:  Negative for difficulty urinating.   Musculoskeletal:  Negative for arthralgias.  Skin:  Negative for itching and rash.  Neurological:  Negative for dizziness, extremity weakness, headaches and numbness.  Hematological:  Negative for  adenopathy. Does not bruise/bleed easily.  Psychiatric/Behavioral:  Negative for depression. The patient is not nervous/anxious.       PHYSICAL EXAMINATION    There were no vitals filed for this visit.  Physical Exam Constitutional:      General: She is not in acute distress.    Appearance: Normal appearance. She is not toxic-appearing.  HENT:     Head: Normocephalic and atraumatic.     Mouth/Throat:     Mouth: Mucous membranes are moist.     Pharynx: Oropharynx is clear. No oropharyngeal exudate or posterior oropharyngeal erythema.  Eyes:     General: No scleral icterus. Cardiovascular:     Rate and Rhythm: Normal  rate and regular rhythm.     Pulses: Normal pulses.     Heart sounds: Normal heart sounds.  Pulmonary:     Effort: Pulmonary effort is normal.     Breath sounds: Normal breath sounds.  Abdominal:     General: Abdomen is flat. Bowel sounds are normal. There is no distension.     Palpations: Abdomen is soft.     Tenderness: There is no abdominal tenderness.  Musculoskeletal:        General: No swelling.     Cervical back: Neck supple.  Lymphadenopathy:     Cervical: No cervical adenopathy.  Skin:    General: Skin is warm and dry.     Findings: No rash.  Neurological:     General: No focal deficit present.     Mental Status: She is alert.  Psychiatric:        Mood and Affect: Mood normal.        Behavior: Behavior normal.     LABORATORY DATA:  CBC    Component Value Date/Time   WBC 5.9 12/12/2022 0000   RBC 4.23 12/12/2022 0000   HGB 12.8 12/12/2022 0000   HGB 13.1 08/11/2018 1331   HGB 12.9 11/07/2016 1529   HCT 37.8 12/12/2022 0000   HCT 38.2 11/07/2016 1529   PLT 324 12/12/2022 0000   PLT 324 08/11/2018 1331   PLT 297 11/07/2016 1529   MCV 89.4 12/12/2022 0000   MCV 88.2 11/07/2016 1529   MCH 30.3 12/12/2022 0000   MCHC 33.9 12/12/2022 0000   RDW 12.6 12/12/2022 0000   RDW 13.1 11/07/2016 1529   LYMPHSABS 2.5 08/11/2018 1331    LYMPHSABS 2.6 11/07/2016 1529   MONOABS 0.4 08/11/2018 1331   MONOABS 0.3 11/07/2016 1529   EOSABS 0.1 08/11/2018 1331   EOSABS 0.1 11/07/2016 1529   BASOSABS 0.0 08/11/2018 1331   BASOSABS 0.0 11/07/2016 1529    CMP     Component Value Date/Time   NA 141 12/12/2022 0000   NA 138 04/11/2020 1132   NA 136 11/07/2016 1529   K 4.1 12/12/2022 0000   K 4.0 11/07/2016 1529   CL 106 12/12/2022 0000   CL 102 02/15/2013 1101   CO2 22 12/12/2022 0000   CO2 25 11/07/2016 1529   GLUCOSE 95 12/12/2022 0000   GLUCOSE 103 11/07/2016 1529   GLUCOSE 112 (H) 02/15/2013 1101   BUN 12 12/12/2022 0000   BUN 13 04/11/2020 1132   BUN 12.6 11/07/2016 1529   CREATININE 0.85 12/12/2022 0000   CREATININE 1.1 11/07/2016 1529   CALCIUM 9.7 12/12/2022 0000   CALCIUM 10.3 11/07/2016 1529   PROT 7.2 12/12/2022 0000   PROT 8.4 (H) 11/07/2016 1529   ALBUMIN 4.3 08/11/2018 1331   ALBUMIN 4.6 11/07/2016 1529   AST 16 12/12/2022 0000   AST 24 08/11/2018 1331   AST 23 11/07/2016 1529   ALT 15 12/12/2022 0000   ALT 25 08/11/2018 1331   ALT 29 11/07/2016 1529   ALKPHOS 75 08/11/2018 1331   ALKPHOS 71 11/07/2016 1529   BILITOT 0.6 12/12/2022 0000   BILITOT 0.4 08/11/2018 1331   BILITOT 0.31 11/07/2016 1529   GFRNONAA 64 09/26/2020 1425   GFRAA 75 09/26/2020 1425       PENDING LABS:   RADIOGRAPHIC STUDIES:  No results found.   PATHOLOGY:     ASSESSMENT and THERAPY PLAN:   No problem-specific Assessment & Plan notes found for this encounter.   No orders of  the defined types were placed in this encounter.   All questions were answered. The patient knows to call the clinic with any problems, questions or concerns. We can certainly see the patient much sooner if necessary. This note was electronically signed. Noreene Filbert, NP 07/01/2023

## 2023-07-09 ENCOUNTER — Ambulatory Visit (INDEPENDENT_AMBULATORY_CARE_PROVIDER_SITE_OTHER): Payer: Medicare Other

## 2023-07-09 ENCOUNTER — Ambulatory Visit: Payer: Medicare Other | Attending: Cardiology | Admitting: Cardiology

## 2023-07-09 ENCOUNTER — Ambulatory Visit: Payer: Medicare Other | Admitting: Cardiology

## 2023-07-09 ENCOUNTER — Encounter: Payer: Self-pay | Admitting: Cardiology

## 2023-07-09 VITALS — BP 124/80 | HR 62 | Ht 62.0 in | Wt 176.0 lb

## 2023-07-09 DIAGNOSIS — E669 Obesity, unspecified: Secondary | ICD-10-CM

## 2023-07-09 DIAGNOSIS — R42 Dizziness and giddiness: Secondary | ICD-10-CM

## 2023-07-09 DIAGNOSIS — E782 Mixed hyperlipidemia: Secondary | ICD-10-CM

## 2023-07-09 DIAGNOSIS — I2510 Atherosclerotic heart disease of native coronary artery without angina pectoris: Secondary | ICD-10-CM

## 2023-07-09 DIAGNOSIS — R7303 Prediabetes: Secondary | ICD-10-CM | POA: Diagnosis not present

## 2023-07-09 DIAGNOSIS — R5383 Other fatigue: Secondary | ICD-10-CM

## 2023-07-09 DIAGNOSIS — I10 Essential (primary) hypertension: Secondary | ICD-10-CM | POA: Diagnosis not present

## 2023-07-09 DIAGNOSIS — R0609 Other forms of dyspnea: Secondary | ICD-10-CM

## 2023-07-09 NOTE — Progress Notes (Unsigned)
Enrolled for [employer] to mail a ZIO XT long term holter monitor to the patients address on file.  

## 2023-07-09 NOTE — Progress Notes (Signed)
Cardiology Office Note:    Date:  07/09/2023   ID:  Elaine Simon, DOB 05-19-1951, MRN [id number]  PCP:  [name], MD  Cardiologist:  Thomasene Ripple, DO  Electrophysiologist:  None   Referring MD: [name], MD   " I am ok"  History of Present Illness:    Elaine Simon is a 72 y.o. female with a hx of minimal coronary artery disease which was seen on coronary CTA in July 2021, hypertension, hyperlipidemia, prediabetes and hypothyroidism here today for follow-up visit.  Her last visit with me was in January 2022 at that time she presented to establish cardiac care.  Will get an echocardiogram which she will left-ventricular hypertrophy.  She has not follow-up since.    Past Medical History:  Diagnosis Date   Anxiety    Breast cancer (HCC) 1995   Cataract immature    bilat.   Depression    Hay fever    Hx of mitral valve prolapse    has had no problems   Hyperlipidemia    Hypertension    Hypothyroidism    Insomnia    Osteoarthritis    Panic attacks    Personal history of chemotherapy    Personal history of radiation therapy 1995   Snores    states occ. apnea, occ. wakes up coughing; has never had sleep study;  denies daytime sleepiness    Past Surgical History:  Procedure Laterality Date   BREAST CYST ASPIRATION Left 02/18/2023   abscess   BREAST LUMPECTOMY Left 1995   left   BREAST RECONSTRUCTION  04/01/2012   Procedure: BREAST RECONSTRUCTION;  Surgeon: Wayland Denis, DO;  Location: [facility];  Service: Plastics;  Laterality: Right;  Right breast mastopexy reduction    GANGLION CYST EXCISION     LYMPH NODE DISSECTION     REDUCTION MAMMAPLASTY Bilateral    TONSILLECTOMY     UTERINE FIBROID SURGERY      Current Medications: Current Meds  Medication Sig   ALPRAZolam (XANAX) 1 MG tablet Take 1 tablet by mouth daily as needed.   amLODipine (NORVASC) 5 MG tablet TAKE 1 TABLET(5 MG) BY MOUTH DAILY   ASPIRIN LOW DOSE 81 MG  EC tablet TAKE 1 TABLET(81 MG) BY MOUTH DAILY. SWALLOW WHOLE   diclofenac sodium (VOLTAREN) 1 % GEL Apply 2 g topically 4 (four) times daily.   docusate sodium (COLACE) 100 MG capsule Take 100 mg by mouth daily as needed for mild constipation.   fluticasone (FLONASE) 50 MCG/ACT nasal spray Place 2 sprays into both nostrils daily as needed.   meloxicam (MOBIC) 15 MG tablet Take 1 tablet by mouth daily.   phentermine (ADIPEX-P) 37.5 MG tablet Take 1 tablet by mouth daily.   rosuvastatin (CRESTOR) 40 MG tablet Take 40 mg by mouth daily.   traZODone (DESYREL) 150 MG tablet Take 150 mg by mouth at bedtime as needed.   Vitamin D, Ergocalciferol, (DRISDOL) 1.25 MG (50000 UNIT) CAPS capsule Take 1 capsule by mouth once a week.     Allergies:   Adhesive [tape] and Codeine   Social History   Socioeconomic History   Marital status: Single    Spouse name: Not on file   Number of children: 0   Years of education: Not on file   Highest education level: Not on file  Occupational History   Not on file  Tobacco Use   Smoking status: Never   Smokeless tobacco: Never  Vaping Use   Vaping  status: Never Used  Substance and Sexual Activity   Alcohol use: Yes    Comment: occasionally   Drug use: No   Sexual activity: Not on file  Other Topics Concern   Not on file  Social History Narrative   Not on file   Social Determinants of Health   Financial Resource Strain: Not on file  Food Insecurity: Not on file  Transportation Needs: Not on file  Physical Activity: Not on file  Stress: Not on file  Social Connections: Unknown (01/25/2022)   Received from [facility], [facility]   Social Network    Social Network: Not on file     Family History: The patient's family history includes Breast cancer in an other family member; Cancer (age of onset: 83) in her brother; Cancer (age of onset: 22) in her maternal grandfather; Cancer (age of onset: 61) in her maternal aunt; Cancer (age of onset: 58)  in her father; Diabetes in an other family member; Heart attack in her father; Heart disease in her father and another family member; Hypertension in her brother and mother.  ROS:   Review of Systems  Constitution: Negative for decreased appetite, fever and weight gain.  HENT: Negative for congestion, ear discharge, hoarse voice and sore throat.   Eyes: Negative for discharge, redness, vision loss in right eye and visual halos.  Cardiovascular: Negative for chest pain, dyspnea on exertion, leg swelling, orthopnea and palpitations.  Respiratory: Negative for cough, hemoptysis, shortness of breath and snoring.   Endocrine: Negative for heat intolerance and polyphagia.  Hematologic/Lymphatic: Negative for bleeding problem. Does not bruise/bleed easily.  Skin: Negative for flushing, nail changes, rash and suspicious lesions.  Musculoskeletal: Negative for arthritis, joint pain, muscle cramps, myalgias, neck pain and stiffness.  Gastrointestinal: Negative for abdominal pain, bowel incontinence, diarrhea and excessive appetite.  Genitourinary: Negative for decreased libido, genital sores and incomplete emptying.  Neurological: Negative for brief paralysis, focal weakness, headaches and loss of balance.  Psychiatric/Behavioral: Negative for altered mental status, depression and suicidal ideas.  Allergic/Immunologic: Negative for HIV exposure and persistent infections.    EKGs/Labs/Other Studies Reviewed:    The following studies were reviewed today:   EKG:  The ekg ordered today demonstrates normal sinus rhythm, heart rate 62 bpm  Recent Labs: 12/12/2022: ALT 15; BUN 12; Creat 0.85; Hemoglobin 12.8; Platelets 324; Potassium 4.1; Sodium 141; TSH 2.01  Recent Lipid Panel    Component Value Date/Time   CHOL 202 (H) 12/12/2022 0000   CHOL 204 (H) 03/13/2020 1342   TRIG 76 12/12/2022 0000   HDL 73 12/12/2022 0000   HDL 81 03/13/2020 1342   CHOLHDL 2.8 12/12/2022 0000   VLDL 34 03/30/2010  0555   LDLCALC 112 (H) 12/12/2022 0000    Physical Exam:    VS:  BP 124/80 (BP Location: Right Arm, Patient Position: Sitting, Cuff Size: Normal)   Pulse 62   Ht 5\' 2"  (1.575 m)   Wt 176 lb (79.8 kg)   SpO2 93%   BMI 32.19 kg/m     Wt Readings from Last 3 Encounters:  07/09/23 176 lb (79.8 kg)  06/20/22 169 lb 1.6 oz (76.7 kg)  06/20/21 202 lb 12.8 oz (92 kg)     GEN: Well nourished, well developed in no acute distress HEENT: Normal NECK: No JVD; No carotid bruits LYMPHATICS: No lymphadenopathy CARDIAC: S1S2 noted,RRR, no murmurs, rubs, gallops RESPIRATORY:  Clear to auscultation without rales, wheezing or rhonchi  ABDOMEN: Soft, non-tender, non-distended, +bowel sounds,  no guarding. EXTREMITIES: No edema, No cyanosis, no clubbing MUSCULOSKELETAL:  No deformity  SKIN: Warm and dry NEUROLOGIC:  Alert and oriented x 3, non-focal PSYCHIATRIC:  Normal affect, good insight  ASSESSMENT:    1. Essential hypertension   2. Mixed hyperlipidemia   3. Prediabetes   4. Mild coronary artery disease   5. Obesity (BMI 30-39.9)    PLAN:     Dizziness, Fatigue and Shortness of Breath . -Order 14-day heart monitor to assess for any arrhythmias for the dizziness. -Repeat echocardiogram (last done in 2022). But with progressive shortness of breath it is beneficial to get a repeat echo. -Check Vitamin D levels today.  Mild cad - no angina symptoms.  Continue use of statin  Follow-up in approximately 6 weeks after completion of heart monitor and receipt of results.   The patient is in agreement with the above plan. The patient left the office in stable condition.  The patient will follow up in   Medication Adjustments/Labs and Tests Ordered: Current medicines are reviewed at length with the patient today.  Concerns regarding medicines are outlined above.  Orders Placed This Encounter  Procedures   EKG 12-Lead   No orders of the defined types were placed in this  encounter.   There are no Patient Instructions on file for this visit.   Adopting a Healthy Lifestyle.  Know what a healthy weight is for you (roughly BMI <25) and aim to maintain this   Aim for 7+ servings of fruits and vegetables daily   65-80+ fluid ounces of water or unsweet tea for healthy kidneys   Limit to max 1 drink of alcohol per day; avoid smoking/tobacco   Limit animal fats in diet for cholesterol and heart health - choose grass fed whenever available   Avoid highly processed foods, and foods high in saturated/trans fats   Aim for low stress - take time to unwind and care for your mental health   Aim for 150 min of moderate intensity exercise weekly for heart health, and weights twice weekly for bone health   Aim for 7-9 hours of sleep daily   When it comes to diets, agreement about the perfect plan isnt easy to find, even among the experts. Experts at the [facility] of [employer] developed an idea known as the Healthy Eating Plate. Just imagine a plate divided into logical, healthy portions.   The emphasis is on diet quality:   Load up on vegetables and fruits - one-half of your plate: Aim for color and variety, and remember that potatoes dont count.   Go for whole grains - one-quarter of your plate: Whole wheat, barley, wheat berries, quinoa, oats, brown rice, and foods made with them. If you want pasta, go with whole wheat pasta.   Protein power - one-quarter of your plate: Fish, chicken, beans, and nuts are all healthy, versatile protein sources. Limit red meat.   The diet, however, does go beyond the plate, offering a few other suggestions.   Use healthy plant oils, such as olive, canola, soy, corn, sunflower and peanut. Check the labels, and avoid partially hydrogenated oil, which have unhealthy trans fats.   If youre thirsty, drink water. Coffee and tea are good in moderation, but skip sugary drinks and limit milk and dairy products to one or two  daily servings.   The type of carbohydrate in the diet is more important than the amount. Some sources of carbohydrates, such as vegetables, fruits, whole grains, and  beans-are healthier than others.   Finally, stay active  Signed, Thomasene Ripple, DO  07/09/2023 10:35 AM    [facility]

## 2023-07-09 NOTE — Patient Instructions (Signed)
Medication Instructions:  Your physician recommends that you continue on your current medications as directed. Please refer to the Current Medication list given to you today.  *If you need a refill on your cardiac medications before your next appointment, please call your pharmacy*   Lab Work: Vit D If you have labs (blood work) drawn today and your tests are completely normal, you will receive your results only by: MyChart Message (if you have MyChart) OR A paper copy in the mail If you have any lab test that is abnormal or we need to change your treatment, we will call you to review the results.   Testing/Procedures: Your physician has requested that you have an echocardiogram. Echocardiography is a painless test that uses sound waves to create images of your heart. It provides your doctor with information about the size and shape of your heart and how well your heart's chambers and valves are working. This procedure takes approximately one hour. There are no restrictions for this procedure. Please do NOT wear cologne, perfume, aftershave, or lotions (deodorant is allowed). Please arrive 15 minutes prior to your appointment time.  ZIO XT- Long Term Monitor Instructions  Your physician has requested you wear a ZIO patch monitor for 14 days.  This is a single patch monitor. [employer] supplies one patch monitor per enrollment. Additional stickers are not available. Please do not apply patch if you will be having a Nuclear Stress Test,  Echocardiogram, Cardiac CT, MRI, or Chest Xray during the period you would be wearing the  monitor. The patch cannot be worn during these tests. You cannot remove and re-apply the  ZIO XT patch monitor.  Your ZIO patch monitor will be mailed 3 day USPS to your address on file. It may take 3-5 days  to receive your monitor after you have been enrolled.  Once you have received your monitor, please review the enclosed instructions. Your monitor  has already been  registered assigning a specific monitor serial # to you.  Billing and Patient Assistance Program Information  We have supplied [employer] with any of your insurance information on file for billing purposes. [employer] offers a sliding scale Patient Assistance Program for patients that do not have  insurance, or whose insurance does not completely cover the cost of the ZIO monitor.  You must apply for the Patient Assistance Program to qualify for this discounted rate.  To apply, please call [employer] at [phone], select option 4, select option 2, ask to apply for  Patient Assistance Program. Meredeth Ide will ask your household income, and how many people  are in your household. They will quote your out-of-pocket cost based on that information.  [employer] will also be able to set up a 41-month, interest-free payment plan if needed.  Applying the monitor   Shave hair from upper left chest.  Hold abrader disc by orange tab. Rub abrader in 40 strokes over the upper left chest as  indicated in your monitor instructions.  Clean area with 4 enclosed alcohol pads. Let dry.  Apply patch as indicated in monitor instructions. Patch will be placed under collarbone on left  side of chest with arrow pointing upward.  Rub patch adhesive wings for 2 minutes. Remove white label marked "1". Remove the white  label marked "2". Rub patch adhesive wings for 2 additional minutes.  While looking in a mirror, press and release button in center of patch. A small green light will  flash 3-4 times. This will be your only indicator  that the monitor has been turned on.  Do not shower for the first 24 hours. You may shower after the first 24 hours.  Press the button if you feel a symptom. You will hear a small click. Record Date, Time and  Symptom in the Patient Logbook.  When you are ready to remove the patch, follow instructions on the last 2 pages of Patient  Logbook. Stick patch monitor onto the last page of Patient  Logbook.  Place Patient Logbook in the blue and white box. Use locking tab on box and tape box closed  securely. The blue and white box has prepaid postage on it. Please place it in the mailbox as  soon as possible. Your physician should have your test results approximately 7 days after the  monitor has been mailed back to [facility].  Call [facility] Customer Care at [phone] if you have questions regarding  your ZIO XT patch monitor. Call them immediately if you see an orange light blinking on your  monitor.  If your monitor falls off in less than 4 days, contact our Monitor department at [phone].  If your monitor becomes loose or falls off after 4 days call [employer] at [phone] for  suggestions on securing your monitor    Follow-Up: At [facility], you and your health needs are our priority.  As part of our continuing mission to provide you with exceptional heart care, we have created designated Provider Care Teams.  These Care Teams include your primary Cardiologist (physician) and Advanced Practice Providers (APPs -  Physician Assistants and Nurse Practitioners) who all work together to provide you with the care you need, when you need it.    Your next appointment:   12 week(s)  Provider:   Thomasene Ripple, DO

## 2023-07-10 ENCOUNTER — Ambulatory Visit: Payer: Medicare Other | Admitting: Adult Health

## 2023-07-10 LAB — VITAMIN D 25 HYDROXY (VIT D DEFICIENCY, FRACTURES): Vit D, 25-Hydroxy: 85.1 ng/mL (ref 30.0–100.0)

## 2023-08-06 ENCOUNTER — Ambulatory Visit (HOSPITAL_COMMUNITY): Payer: Medicare Other | Attending: Cardiology

## 2023-08-06 DIAGNOSIS — R0609 Other forms of dyspnea: Secondary | ICD-10-CM | POA: Insufficient documentation

## 2023-08-06 DIAGNOSIS — R42 Dizziness and giddiness: Secondary | ICD-10-CM

## 2023-08-06 LAB — ECHOCARDIOGRAM COMPLETE
Area-P 1/2: 3.65 cm2
S' Lateral: 2.4 cm

## 2023-10-01 ENCOUNTER — Encounter: Payer: Self-pay | Admitting: Cardiology

## 2023-10-01 ENCOUNTER — Ambulatory Visit: Payer: Medicare Other | Attending: Cardiology | Admitting: Cardiology

## 2023-10-01 VITALS — BP 110/72 | HR 58 | Ht 62.0 in | Wt 181.6 lb

## 2023-10-01 DIAGNOSIS — E782 Mixed hyperlipidemia: Secondary | ICD-10-CM

## 2023-10-01 DIAGNOSIS — I471 Supraventricular tachycardia, unspecified: Secondary | ICD-10-CM | POA: Diagnosis not present

## 2023-10-01 DIAGNOSIS — I2510 Atherosclerotic heart disease of native coronary artery without angina pectoris: Secondary | ICD-10-CM

## 2023-10-01 DIAGNOSIS — Z79899 Other long term (current) drug therapy: Secondary | ICD-10-CM

## 2023-10-01 DIAGNOSIS — I10 Essential (primary) hypertension: Secondary | ICD-10-CM | POA: Diagnosis not present

## 2023-10-01 DIAGNOSIS — I4729 Other ventricular tachycardia: Secondary | ICD-10-CM | POA: Diagnosis not present

## 2023-10-01 LAB — COMPREHENSIVE METABOLIC PANEL
ALT: 21 [IU]/L (ref 0–32)
AST: 19 [IU]/L (ref 0–40)
Albumin: 4.5 g/dL (ref 3.8–4.8)
Alkaline Phosphatase: 89 [IU]/L (ref 44–121)
BUN/Creatinine Ratio: 17 (ref 12–28)
BUN: 16 mg/dL (ref 8–27)
Bilirubin Total: <0.2 mg/dL (ref 0.0–1.2)
CO2: 25 mmol/L (ref 20–29)
Calcium: 10 mg/dL (ref 8.7–10.3)
Chloride: 105 mmol/L (ref 96–106)
Creatinine, Ser: 0.93 mg/dL (ref 0.57–1.00)
Globulin, Total: 2.4 g/dL (ref 1.5–4.5)
Glucose: 97 mg/dL (ref 70–99)
Potassium: 4.7 mmol/L (ref 3.5–5.2)
Sodium: 143 mmol/L (ref 134–144)
Total Protein: 6.9 g/dL (ref 6.0–8.5)
eGFR: 65 mL/min/{1.73_m2} (ref >59)

## 2023-10-01 LAB — MAGNESIUM: Magnesium: 2.4 mg/dL — ABNORMAL HIGH (ref 1.6–2.3)

## 2023-10-01 MED ORDER — DILTIAZEM HCL ER COATED BEADS 120 MG PO CP24: 120.0000 mg | ORAL_CAPSULE | Freq: Every day | ORAL | 3 refills | Status: DC

## 2023-10-01 NOTE — Patient Instructions (Signed)
 Medication Instructions:  Your physician has recommended you make the following change in your medication:  STOP: Amlodipine  START: Cardizem  120 mg once daily *If you need a refill on your cardiac medications before your next appointment, please call your pharmacy*   Lab Work: CMET, Mag If you have labs (blood work) drawn today and your tests are completely normal, you will receive your results only by: MyChart Message (if you have MyChart) OR A paper copy in the mail If you have any lab test that is abnormal or we need to change your treatment, we will call you to review the results.  Follow-Up: At [facility], you and your health needs are our priority.  As part of our continuing mission to provide you with exceptional heart care, we have created designated Provider Care Teams.  These Care Teams include your primary Cardiologist (physician) and Advanced Practice Providers (APPs -  Physician Assistants and Nurse Practitioners) who all work together to provide you with the care you need, when you need it.  Your next appointment:   6 month(s)  Provider:   Kardie Tobb, DO     Other Instructions

## 2023-10-03 NOTE — Progress Notes (Signed)
Cardiology Office Note:    Date:  10/03/2023   ID:  Elaine Simon, DOB 10/02/1950, MRN [id number]  PCP:  [name], MD  Cardiologist:  Thomasene Ripple, DO  Electrophysiologist:  None   Referring MD: [name], MD   " I am ok"  History of Present Illness:    Elaine Simon is a 73 y.o. female with a hx of minimal coronary artery disease which was seen on coronary CTA in July 2021, hypertension, hyperlipidemia, prediabetes, NSVT on ZIO monitor and paroxysmal supraventricular tachycardia and hypothyroidism here today for follow-up visit.  At her last visit she was experiencing dizziness I placed a monitor on the patient.  Today she is here for follow-up visit.  She eports feeling better recently due to a decrease in activity and stress. She has been experiencing anxiety due to a persistent individual who constantly calls her throughout the day.  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    Past Medical History:  Diagnosis Date   Anxiety    Breast cancer (HCC) 1995   Cataract immature    bilat.   Depression    Hay fever    Hx of mitral valve prolapse    has had no problems   Hyperlipidemia    Hypertension    Hypothyroidism    Insomnia    Osteoarthritis    Panic attacks    Personal history of chemotherapy    Personal history of radiation therapy 1995   Snores    states occ. apnea, occ. wakes up coughing; has never had sleep study;  denies daytime sleepiness    Past Surgical History:  Procedure Laterality Date   BREAST CYST ASPIRATION Left 02/18/2023   abscess   BREAST LUMPECTOMY Left 1995   left   BREAST RECONSTRUCTION  04/01/2012   Procedure: BREAST RECONSTRUCTION;  Surgeon: Wayland Denis, DO;  Location: [facility];  Service: Plastics;  Laterality: Right;  Right breast mastopexy reduction    GANGLION CYST EXCISION     LYMPH NODE DISSECTION     REDUCTION  MAMMAPLASTY Bilateral    TONSILLECTOMY     UTERINE FIBROID SURGERY      Current Medications: Current Meds  Medication Sig   ALPRAZolam (XANAX) 1 MG tablet Take 1 tablet by mouth daily as needed.   ASPIRIN LOW DOSE 81 MG EC tablet TAKE 1 TABLET(81 MG) BY MOUTH DAILY. SWALLOW WHOLE   diclofenac sodium (VOLTAREN) 1 % GEL Apply 2 g topically 4 (four) times daily.   diltiazem (CARDIZEM CD) 120 MG 24 hr capsule Take 1 capsule (120 mg total) by mouth daily.   docusate sodium (COLACE) 100 MG capsule Take 100 mg by mouth daily as needed for mild constipation.   fluticasone (FLONASE) 50 MCG/ACT nasal spray Place 2 sprays into both nostrils daily as needed.   gabapentin (NEURONTIN) 300 MG capsule Take 300 mg by mouth 3 (three) times daily.   meloxicam (MOBIC) 15 MG tablet Take 1 tablet by mouth daily.   phentermine (ADIPEX-P) 37.5 MG tablet Take 1 tablet by mouth daily.   rosuvastatin (CRESTOR) 40 MG tablet Take 40 mg by mouth daily.   traZODone (DESYREL) 150 MG tablet Take 150 mg by mouth at bedtime as needed.   Vitamin D, Ergocalciferol, (DRISDOL) 1.25 MG (50000 UNIT) CAPS capsule Take 1 capsule by mouth once a week.   [DISCONTINUED] amLODipine (NORVASC) 5 MG tablet TAKE 1  TABLET(5 MG) BY MOUTH DAILY     Allergies:   Adhesive [tape] and Codeine   Social History   Socioeconomic History   Marital status: Single    Spouse name: Not on file   Number of children: 0   Years of education: Not on file   Highest education level: Not on file  Occupational History   Not on file  Tobacco Use   Smoking status: Never   Smokeless tobacco: Never  Vaping Use   Vaping status: Never Used  Substance and Sexual Activity   Alcohol use: Yes    Comment: occasionally   Drug use: No   Sexual activity: Not on file  Other Topics Concern   Not on file  Social History Narrative   Not on file   Social Drivers of Health   Financial Resource Strain: Not on file  Food Insecurity: Not on file   Transportation Needs: Not on file  Physical Activity: Not on file  Stress: Not on file  Social Connections: Unknown (01/25/2022)   Received from [facility]   Social Network    Social Network: Not on file     Family History: The patient's family history includes Breast cancer in an other family member; Cancer (age of onset: 58) in her brother; Cancer (age of onset: 34) in her maternal grandfather; Cancer (age of onset: 90) in her maternal aunt; Cancer (age of onset: 86) in her father; Diabetes in an other family member; Heart attack in her father; Heart disease in her father and another family member; Hypertension in her brother and mother.  ROS:   Review of Systems  Constitution: Negative for decreased appetite, fever and weight gain.  HENT: Negative for congestion, ear discharge, hoarse voice and sore throat.   Eyes: Negative for discharge, redness, vision loss in right eye and visual halos.  Cardiovascular: Negative for chest pain, dyspnea on exertion, leg swelling, orthopnea and palpitations.  Respiratory: Negative for cough, hemoptysis, shortness of breath and snoring.   Endocrine: Negative for heat intolerance and polyphagia.  Hematologic/Lymphatic: Negative for bleeding problem. Does not bruise/bleed easily.  Skin: Negative for flushing, nail changes, rash and suspicious lesions.  Musculoskeletal: Negative for arthritis, joint pain, muscle cramps, myalgias, neck pain and stiffness.  Gastrointestinal: Negative for abdominal pain, bowel incontinence, diarrhea and excessive appetite.  Genitourinary: Negative for decreased libido, genital sores and incomplete emptying.  Neurological: Negative for brief paralysis, focal weakness, headaches and loss of balance.  Psychiatric/Behavioral: Negative for altered mental status, depression and suicidal ideas.  Allergic/Immunologic: Negative for HIV exposure and persistent infections.    EKGs/Labs/Other Studies Reviewed:     The following studies were reviewed today:   EKG:  The ekg ordered today demonstrates normal sinus rhythm, heart rate 62 bpm  Recent Labs: 12/12/2022: Hemoglobin 12.8; Platelets 324; TSH 2.01 10/01/2023: ALT 21; BUN 16; Creatinine, Ser 0.93; Magnesium 2.4; Potassium 4.7; Sodium 143  Recent Lipid Panel    Component Value Date/Time   CHOL 202 (H) 12/12/2022 0000   CHOL 204 (H) 03/13/2020 1342   TRIG 76 12/12/2022 0000   HDL 73 12/12/2022 0000   HDL 81 03/13/2020 1342   CHOLHDL 2.8 12/12/2022 0000   VLDL 34 03/30/2010 0555   LDLCALC 112 (H) 12/12/2022 0000    Physical Exam:    VS:  BP 110/72 (BP Location: Right Arm, Patient Position: Sitting, Cuff Size: Normal)   Pulse (!) 58   Ht 5\' 2"  (1.575 m)   Wt 181 lb  9.6 oz (82.4 kg)   SpO2 94%   BMI 33.22 kg/m     Wt Readings from Last 3 Encounters:  10/01/23 181 lb 9.6 oz (82.4 kg)  07/09/23 176 lb (79.8 kg)  06/20/22 169 lb 1.6 oz (76.7 kg)     GEN: Well nourished, well developed in no acute distress HEENT: Normal NECK: No JVD; No carotid bruits LYMPHATICS: No lymphadenopathy CARDIAC: S1S2 noted,RRR, no murmurs, rubs, gallops RESPIRATORY:  Clear to auscultation without rales, wheezing or rhonchi  ABDOMEN: Soft, non-tender, non-distended, +bowel sounds, no guarding. EXTREMITIES: No edema, No cyanosis, no clubbing MUSCULOSKELETAL:  No deformity  SKIN: Warm and dry NEUROLOGIC:  Alert and oriented x 3, non-focal PSYCHIATRIC:  Normal affect, good insight  ASSESSMENT:    1. Medication management   2. NSVT (nonsustained ventricular tachycardia) (HCC)   3. PSVT (paroxysmal supraventricular tachycardia) (HCC)   4. Minimal CAD   5. Essential hypertension   6. Mixed hyperlipidemia    PLAN:     Her monitor shows evidence of nonsustained ventricular tachycardia and paroxysmal supraventricular tachycardia.  Currently she is on amlodipine I like to stop this medication and start the patient on Cardizem 120 mg daily.  Mild  cad - no angina symptoms.  Hypertension, blood pressure is appropriate.  Will monitor off the amlodipine.  However as noted above she will be started on Cardizem.  Hyperlipidemia-continue statin   The patient is in agreement with the above plan. The patient left the office in stable condition.  The patient will follow up in   Medication Adjustments/Labs and Tests Ordered: Current medicines are reviewed at length with the patient today.  Concerns regarding medicines are outlined above.  Orders Placed This Encounter  Procedures   Comprehensive Metabolic Panel (CMET)   Magnesium   Meds ordered this encounter  Medications   diltiazem (CARDIZEM CD) 120 MG 24 hr capsule    Sig: Take 1 capsule (120 mg total) by mouth daily.    Dispense:  90 capsule    Refill:  3    Patient Instructions  Medication Instructions:  Your physician has recommended you make the following change in your medication:  STOP: Amlodipine START: Cardizem 120 mg once daily *If you need a refill on your cardiac medications before your next appointment, please call your pharmacy*   Lab Work: CMET, Mag If you have labs (blood work) drawn today and your tests are completely normal, you will receive your results only by: MyChart Message (if you have MyChart) OR A paper copy in the mail If you have any lab test that is abnormal or we need to change your treatment, we will call you to review the results.  Follow-Up: At [facility], you and your health needs are our priority.  As part of our continuing mission to provide you with exceptional heart care, we have created designated Provider Care Teams.  These Care Teams include your primary Cardiologist (physician) and Advanced Practice Providers (APPs -  Physician Assistants and Nurse Practitioners) who all work together to provide you with the care you need, when you need it.  Your next appointment:   6 month(s)  Provider:   Thomasene Ripple, DO     Other  Instructions      Adopting a Healthy Lifestyle.  Know what a healthy weight is for you (roughly BMI <25) and aim to maintain this   Aim for 7+ servings of fruits and vegetables daily   65-80+ fluid ounces of water or unsweet  tea for healthy kidneys   Limit to max 1 drink of alcohol per day; avoid smoking/tobacco   Limit animal fats in diet for cholesterol and heart health - choose grass fed whenever available   Avoid highly processed foods, and foods high in saturated/trans fats   Aim for low stress - take time to unwind and care for your mental health   Aim for 150 min of moderate intensity exercise weekly for heart health, and weights twice weekly for bone health   Aim for 7-9 hours of sleep daily   When it comes to diets, agreement about the perfect plan isnt easy to find, even among the experts. Experts at the [facility] of [employer] developed an idea known as the Healthy Eating Plate. Just imagine a plate divided into logical, healthy portions.   The emphasis is on diet quality:   Load up on vegetables and fruits - one-half of your plate: Aim for color and variety, and remember that potatoes dont count.   Go for whole grains - one-quarter of your plate: Whole wheat, barley, wheat berries, quinoa, oats, brown rice, and foods made with them. If you want pasta, go with whole wheat pasta.   Protein power - one-quarter of your plate: Fish, chicken, beans, and nuts are all healthy, versatile protein sources. Limit red meat.   The diet, however, does go beyond the plate, offering a few other suggestions.   Use healthy plant oils, such as olive, canola, soy, corn, sunflower and peanut. Check the labels, and avoid partially hydrogenated oil, which have unhealthy trans fats.   If youre thirsty, drink water. Coffee and tea are good in moderation, but skip sugary drinks and limit milk and dairy products to one or two daily servings.   The type of carbohydrate in the diet  is more important than the amount. Some sources of carbohydrates, such as vegetables, fruits, whole grains, and beans-are healthier than others.   Finally, stay active  Signed, Thomasene Ripple, DO  10/03/2023 6:49 PM    [facility]

## 2023-10-10 ENCOUNTER — Telehealth: Payer: Self-pay | Admitting: Cardiology

## 2023-10-10 NOTE — Telephone Encounter (Signed)
Pt c/o medication issue:  1. Name of Medication:   diltiazem (CARDIZEM CD) 120 MG 24 hr capsule   2. How are you currently taking this medication (dosage and times per day)?   Haven't started medication yet  3. Are you having a reaction (difficulty breathing--STAT)?   4. What is your medication issue?   Patient stated she wants a call back to discuss this medication, its effects and how this medication will address her rapid heart beats.  Patient noted she wore a heart monitor for 14 days and she wants to get clarity on next steps.

## 2023-10-10 NOTE — Telephone Encounter (Signed)
Patient identification verified by 2 forms. Marilynn Rail, RN    Called and spoke to patient  Patient states:   -wore a heart monitor   -she does not fully understand interpretation of event monitor    -discussed results at 10/01/23 OV   -would like to know what type of danger she is in with current condition Informed patient:   -Per 1/15 OV monitor showed NSVT and Paroxysmal SVT   -NSVT is arrhythmia causes rapid HR that don't last long   -Paroxysmal SVT arrhythmia rapid heart rate that comes and goes   -was advised to d/c amlodipine and start Cardizem   -Cardizem will help provide rate control and BP management   -the rate control should help decrease episodes of arrhythmias  Advised patient:   -Monitor BP at home with medication change   -monitor symptoms of arrhythmia (palpitation, lightheadedness/dizziness)  Patient verbalized understanding, states she feels more reassured  Patient has no further questions at this time

## 2023-10-24 ENCOUNTER — Other Ambulatory Visit: Payer: Self-pay | Admitting: Cardiology

## 2023-10-24 LAB — LIPID PANEL
Cholesterol: 126
HDL: 78
LDL: 35
Triglycerides: 65 (ref 40–160)

## 2023-10-24 LAB — HEMOGLOBIN A1C: Hemoglobin A1C: 5.9

## 2023-10-24 NOTE — Progress Notes (Signed)
 Pt. Is prediabetic.

## 2023-10-25 LAB — LIPID PANEL W/O CHOL/HDL RATIO
Cholesterol, Total: 172 mg/dL (ref 100–199)
HDL: 71 mg/dL (ref >39)
LDL Chol Calc (NIH): 89 mg/dL (ref 0–99)
Triglycerides: 63 mg/dL (ref 0–149)
VLDL Cholesterol Cal: 12 mg/dL (ref 5–40)

## 2023-12-09 NOTE — Progress Notes (Signed)
 The patient attended a screening event on 10/24/23 where her BP screening results was 133/76 and her A1C was 5.9 (prediabetic). At the event the patient noted she has a pcp, doesn't smoke and had a utility sdoh insecurity. Pt was recommended to follow up with her pcp pertaining to her blood glucose at the event.  Per chart review the pt does have a pcp care team, last ov with pcp in cardiology was 10/03/23. Per chart Dr. Emmette Harms is aware of the patient's  Chart review also indicates a future appt with radiology on 02/19/24. CHW called pt three times. A VM was left on the first call attempt, the mailbox was full on the last two call attempts.A letter was sent out along with utility resources via mail and MyChart. An additional follow up will be done in according to the health equity team's protocol.

## 2023-12-10 ENCOUNTER — Other Ambulatory Visit: Payer: Self-pay | Admitting: Internal Medicine

## 2023-12-10 DIAGNOSIS — Z1231 Encounter for screening mammogram for malignant neoplasm of breast: Secondary | ICD-10-CM

## 2024-02-19 ENCOUNTER — Ambulatory Visit
Admission: RE | Admit: 2024-02-19 | Discharge: 2024-02-19 | Disposition: A | Discharge status: Home / Self Care | Source: Ambulatory Visit | Attending: Internal Medicine | Admitting: Internal Medicine

## 2024-02-19 DIAGNOSIS — Z1231 Encounter for screening mammogram for malignant neoplasm of breast: Secondary | ICD-10-CM

## 2024-02-23 ENCOUNTER — Other Ambulatory Visit: Payer: Self-pay | Admitting: Internal Medicine

## 2024-02-23 DIAGNOSIS — R234 Changes in skin texture: Secondary | ICD-10-CM

## 2024-02-25 ENCOUNTER — Ambulatory Visit
Admission: RE | Admit: 2024-02-25 | Discharge: 2024-02-25 | Discharge status: Home / Self Care | Source: Ambulatory Visit | Attending: Internal Medicine | Admitting: Internal Medicine

## 2024-02-25 DIAGNOSIS — R234 Changes in skin texture: Secondary | ICD-10-CM

## 2024-03-15 ENCOUNTER — Telehealth: Payer: Self-pay

## 2024-03-15 NOTE — Telephone Encounter (Signed)
 Error - duplicate

## 2024-03-15 NOTE — Telephone Encounter (Signed)
 Pt called and LVM stating she has breast dimpling concerns noted after her MM/US . She is asking for MD appt. Called pt to offer symptom management appt with Morna Kendall, NP to further eval this breast concern within one week.

## 2024-03-18 ENCOUNTER — Telehealth: Payer: Self-pay | Admitting: *Deleted

## 2024-03-18 NOTE — Telephone Encounter (Signed)
 Received VM from pt requesting to schedule yearly f/u with MD.  RN sent message to scheduling team to schedule.

## 2024-03-22 ENCOUNTER — Telehealth: Payer: Self-pay | Admitting: Hematology and Oncology

## 2024-03-22 NOTE — Telephone Encounter (Signed)
 Scheduled appointment per 7/3 staff message. Talked with the patient and she is aware of the made appointment.

## 2024-04-01 NOTE — Progress Notes (Unsigned)
 Pt attended 10/24/23 screening event where her BP was 133/79 and her A1c was 5.9. At the event pt noted having a PCP but did not document if she had insurance. Pt noted she was not an active smoker and also noted SDOH utility need. Chart review indicated that pt is indeed established with PCP Dr. Shelda. CHW contacted pt via phone call to inquire if pt was in need of insurance help and to f/u on SDOH utility need. Pt answered and stated that she does have insurance and that she has a future appt on 04/12/2024 with Dr. Shelda and will address her A1c. Pt stated to CHW that she continues to struggle with her utility bills and that she also has food insecurity. CHW sent letter to remind pt to f/u with PCP about abnormal A1c 5.9. Also sent information on [employer], [place] pantry's and out of the garden project to support food insecurity. Also, CHW sent [employer], Emergency Rental assistance program (ERAP) and Low Income Energy Program [place]) information to support utility assistance needs. To support pt A1C CHW sent information to the Healthy Cooking class and Normal Diabetes Education.  Additional pt f/u to be scheduled per Health Equity protocol.

## 2024-04-03 ENCOUNTER — Telehealth: Payer: Self-pay | Admitting: Pediatrics

## 2024-04-03 NOTE — Telephone Encounter (Signed)
 [facility] covering for Dr. Rollin  I spoke on the phone with [name] regarding symptoms of nausea, vomiting and abdominal pain.  She reports a 3-week history of symptoms.  States she was seen by Dr. Rollin earlier this week and had labs that were normal.  I am unable to see these results.  Reports worsening pain in her right upper quadrant under her breast area.  States that this morning she had an episode of emesis that was dark and worried her that there was feces in her vomitus.  No fevers or chills.  States that she has been constipated and took a laxative.  Notes that she is having difficulty with oral tolerance and has been eating very little.  We discussed that her symptoms could reflect biliary colic, pancreatic disorder, gastritis, peptic ulcer disease as possibilities.  Given her ongoing symptoms I recommended that she be evaluated in the emergency room this weekend with physical exam, updated labs and abdominal imaging.  [name] expressed understanding of her conversation and stated that she would proceed to an emergency room today.

## 2024-04-04 ENCOUNTER — Emergency Department (HOSPITAL_COMMUNITY)

## 2024-04-04 ENCOUNTER — Other Ambulatory Visit: Payer: Self-pay

## 2024-04-04 ENCOUNTER — Telehealth (HOSPITAL_COMMUNITY): Payer: Self-pay | Admitting: Emergency Medicine

## 2024-04-04 ENCOUNTER — Emergency Department (HOSPITAL_COMMUNITY)
Admission: EM | Admit: 2024-04-04 | Discharge: 2024-04-04 | Disposition: A | Discharge status: Home / Self Care | Attending: Emergency Medicine | Admitting: Emergency Medicine

## 2024-04-04 DIAGNOSIS — K3189 Other diseases of stomach and duodenum: Secondary | ICD-10-CM | POA: Diagnosis not present

## 2024-04-04 DIAGNOSIS — I10 Essential (primary) hypertension: Secondary | ICD-10-CM | POA: Insufficient documentation

## 2024-04-04 DIAGNOSIS — Z79899 Other long term (current) drug therapy: Secondary | ICD-10-CM | POA: Insufficient documentation

## 2024-04-04 DIAGNOSIS — Z7982 Long term (current) use of aspirin: Secondary | ICD-10-CM | POA: Insufficient documentation

## 2024-04-04 DIAGNOSIS — K7689 Other specified diseases of liver: Secondary | ICD-10-CM | POA: Insufficient documentation

## 2024-04-04 DIAGNOSIS — E039 Hypothyroidism, unspecified: Secondary | ICD-10-CM | POA: Diagnosis not present

## 2024-04-04 DIAGNOSIS — R1011 Right upper quadrant pain: Secondary | ICD-10-CM

## 2024-04-04 DIAGNOSIS — Z853 Personal history of malignant neoplasm of breast: Secondary | ICD-10-CM | POA: Insufficient documentation

## 2024-04-04 DIAGNOSIS — R160 Hepatomegaly, not elsewhere classified: Secondary | ICD-10-CM

## 2024-04-04 LAB — URINALYSIS, ROUTINE W REFLEX MICROSCOPIC
Bacteria, UA: NONE SEEN
Bilirubin Urine: NEGATIVE
Glucose, UA: NEGATIVE mg/dL
Hgb urine dipstick: NEGATIVE
Ketones, ur: 5 mg/dL — AB
Leukocytes,Ua: NEGATIVE
Nitrite: NEGATIVE
Protein, ur: 30 mg/dL — AB
Specific Gravity, Urine: 1.032 — ABNORMAL HIGH (ref 1.005–1.030)
pH: 5 (ref 5.0–8.0)

## 2024-04-04 LAB — CBC WITH DIFFERENTIAL/PLATELET
Abs Immature Granulocytes: 0.02 K/uL (ref 0.00–0.07)
Basophils Absolute: 0 K/uL (ref 0.0–0.1)
Basophils Relative: 1 %
Eosinophils Absolute: 0.1 K/uL (ref 0.0–0.5)
Eosinophils Relative: 1 %
HCT: 34.1 % — ABNORMAL LOW (ref 36.0–46.0)
Hemoglobin: 10.7 g/dL — ABNORMAL LOW (ref 12.0–15.0)
Immature Granulocytes: 0 %
Lymphocytes Relative: 33 %
Lymphs Abs: 1.9 K/uL (ref 0.7–4.0)
MCH: 28.2 pg (ref 26.0–34.0)
MCHC: 31.4 g/dL (ref 30.0–36.0)
MCV: 90 fL (ref 80.0–100.0)
Monocytes Absolute: 0.4 K/uL (ref 0.1–1.0)
Monocytes Relative: 7 %
Neutro Abs: 3.3 K/uL (ref 1.7–7.7)
Neutrophils Relative %: 58 %
Platelets: 343 K/uL (ref 150–400)
RBC: 3.79 MIL/uL — ABNORMAL LOW (ref 3.87–5.11)
RDW: 14.1 % (ref 11.5–15.5)
WBC: 5.6 K/uL (ref 4.0–10.5)
nRBC: 0 % (ref 0.0–0.2)

## 2024-04-04 LAB — COMPREHENSIVE METABOLIC PANEL WITH GFR
ALT: 30 U/L (ref 0–44)
AST: 29 U/L (ref 15–41)
Albumin: 3.6 g/dL (ref 3.5–5.0)
Alkaline Phosphatase: 95 U/L (ref 38–126)
Anion gap: 11 (ref 5–15)
BUN: 20 mg/dL (ref 8–23)
CO2: 21 mmol/L — ABNORMAL LOW (ref 22–32)
Calcium: 9.6 mg/dL (ref 8.9–10.3)
Chloride: 108 mmol/L (ref 98–111)
Creatinine, Ser: 1.08 mg/dL — ABNORMAL HIGH (ref 0.44–1.00)
GFR, Estimated: 55 mL/min — ABNORMAL LOW (ref >60)
Glucose, Bld: 125 mg/dL — ABNORMAL HIGH (ref 70–99)
Potassium: 3.7 mmol/L (ref 3.5–5.1)
Sodium: 140 mmol/L (ref 135–145)
Total Bilirubin: 0.4 mg/dL (ref 0.0–1.2)
Total Protein: 7.1 g/dL (ref 6.5–8.1)

## 2024-04-04 LAB — LIPASE, BLOOD: Lipase: 112 U/L — ABNORMAL HIGH (ref 11–51)

## 2024-04-04 MED ORDER — IOHEXOL 300 MG/ML  SOLN
100.0000 mL | Freq: Once | INTRAMUSCULAR | Status: AC | PRN
  Administered 2024-04-04: 100 mL via INTRAVENOUS

## 2024-04-04 MED ORDER — HYDROCODONE-ACETAMINOPHEN 5-325 MG PO TABS
1.0000 | ORAL_TABLET | Freq: Four times a day (QID) | ORAL | 0 refills | Status: DC | PRN
Start: 2024-04-04 — End: 2024-04-05

## 2024-04-04 MED ORDER — HYDROCODONE-ACETAMINOPHEN 5-325 MG PO TABS: 1.0000 | ORAL_TABLET | Freq: Four times a day (QID) | ORAL | 0 refills | Status: DC | PRN

## 2024-04-04 NOTE — ED Notes (Signed)
 Called lab to add Lipase lab to previous sample sent

## 2024-04-04 NOTE — ED Triage Notes (Signed)
 Pt presents abdominal pain in the right upper and lower quadrants that has been there for 3 weeks. Pt states that she has had a lot of N/V/D x 1week.

## 2024-04-04 NOTE — ED Notes (Signed)
 Ultrasound at bedside

## 2024-04-04 NOTE — Telephone Encounter (Signed)
 Patient seen earlier and diagnosed with possible malignancy and prescribed norco for pain by other provider. Pharmacy is closed. Will send 2 additional tablet to 24 hour pharmacy and she will need to pick up the rest tomorrow at her regular pharmacy.

## 2024-04-04 NOTE — Discharge Instructions (Addendum)
 Your ct scan shows a mass to your liver and your colon.  These areas need to have further evaluation.  See Dr. Gudena tomorrow for evalatuion

## 2024-04-04 NOTE — ED Provider Notes (Signed)
 [place] EMERGENCY DEPARTMENT AT [facility] Provider Note   CSN: [id number] Arrival date & time: 04/04/24  9152     Patient presents with: Nausea, Emesis, and Abdominal Pain   CORINTHIA HELMERS is a 73 y.o. female.   Pt complains of right upper abdominal pain for the past 3 weeks.  Pt reports she has had nausea.  Pt denies any fever or chills.  Pt denies diarrhea, no constipation. Pt denies any black or tarry stools.   Pt has not had any chest pain or shortness of breath.  Patient has a past medical history of hypertension, hyperlipidemia, Hypothyroidism mitral valve prolapse and breast cancer.  The history is provided by the patient. No language interpreter was used.  Emesis Associated symptoms: abdominal pain   Abdominal Pain Associated symptoms: vomiting        Prior to Admission medications   Medication Sig Start Date End Date Taking? Authorizing Provider  ALPRAZolam  (XANAX ) 1 MG tablet Take 1 tablet by mouth daily as needed. 06/11/19   [provider]  ASPIRIN  LOW DOSE 81 MG EC tablet TAKE 1 TABLET(81 MG) BY MOUTH DAILY. SWALLOW WHOLE 02/27/21   Patwardhan, Newman PARAS, MD  diclofenac  sodium (VOLTAREN ) 1 % GEL Apply 2 g topically 4 (four) times daily. 08/10/17   Babara, Amy V, PA-C  diltiazem  (CARDIZEM  CD) 120 MG 24 hr capsule Take 1 capsule (120 mg total) by mouth daily. 10/01/23   Tobb, Kardie, DO  docusate sodium (COLACE) 100 MG capsule Take 100 mg by mouth daily as needed for mild constipation.    [provider]  fluticasone (FLONASE) 50 MCG/ACT nasal spray Place 2 sprays into both nostrils daily as needed. 06/23/19   [provider]  gabapentin  (NEURONTIN ) 300 MG capsule Take 300 mg by mouth 3 (three) times daily.    [provider]  meloxicam  (MOBIC ) 15 MG tablet Take 1 tablet by mouth daily. 05/25/20   [provider]  phentermine (ADIPEX-P) 37.5 MG tablet Take 1 tablet by mouth daily. 06/10/19   [provider]   rosuvastatin (CRESTOR) 40 MG tablet Take 40 mg by mouth daily. 08/31/20   [provider]  traZODone (DESYREL) 150 MG tablet Take 150 mg by mouth at bedtime as needed. 08/19/20   [provider]  Vitamin D , Ergocalciferol , (DRISDOL) 1.25 MG (50000 UNIT) CAPS capsule Take 1 capsule by mouth once a week. 09/27/20   [provider]    Allergies: Adhesive [tape] and Codeine    Review of Systems  Gastrointestinal:  Positive for abdominal pain and vomiting.  All other systems reviewed and are negative.   Updated Vital Signs BP 108/75 (BP Location: Left Arm)   Pulse 61   Temp 97.7 F (36.5 C) (Oral)   Resp 14   Ht 5' 2 (1.575 m)   Wt 82.4 kg   SpO2 97%   BMI 33.23 kg/m   Physical Exam Vitals and nursing note reviewed.  Constitutional:      Appearance: She is well-developed.  HENT:     Head: Normocephalic.  Cardiovascular:     Rate and Rhythm: Normal rate.  Pulmonary:     Effort: Pulmonary effort is normal.  Abdominal:     General: Abdomen is flat. Bowel sounds are normal. There is no distension.     Tenderness: There is abdominal tenderness.  Musculoskeletal:        General: Normal range of motion.     Cervical back: Normal range of motion.  Skin:    General: Skin is warm.  Neurological:     General: No focal deficit present.     Mental Status: She is alert and oriented to person, place, and time.  Psychiatric:        Mood and Affect: Mood normal.     (all labs ordered are listed, but only abnormal results are displayed) Labs Reviewed  CBC WITH DIFFERENTIAL/PLATELET - Abnormal; Notable for the following components:      Result Value   RBC 3.79 (*)    Hemoglobin 10.7 (*)    HCT 34.1 (*)    All other components within normal limits  COMPREHENSIVE METABOLIC PANEL WITH GFR - Abnormal; Notable for the following components:   CO2 21 (*)    Glucose, Bld 125 (*)    Creatinine, Ser 1.08 (*)    GFR, Estimated 55 (*)    All other components  within normal limits  URINALYSIS, ROUTINE W REFLEX MICROSCOPIC - Abnormal; Notable for the following components:   APPearance HAZY (*)    Specific Gravity, Urine 1.032 (*)    Ketones, ur 5 (*)    Protein, ur 30 (*)    All other components within normal limits  LIPASE, BLOOD - Abnormal; Notable for the following components:   Lipase 112 (*)    All other components within normal limits    EKG: None  Radiology: CT ABDOMEN PELVIS W CONTRAST Result Date: 04/04/2024 CLINICAL DATA:  Abdominal pain. Evaluate mass seen on ultrasound. * Tracking Code: BO * EXAM: CT ABDOMEN AND PELVIS WITH CONTRAST TECHNIQUE: Multidetector CT imaging of the abdomen and pelvis was performed using the standard protocol following bolus administration of intravenous contrast. RADIATION DOSE REDUCTION: This exam was performed according to the departmental dose-optimization program which includes automated exposure control, adjustment of the mA and/or kV according to patient size and/or use of iterative reconstruction technique. CONTRAST:  OMNIPAQUE  IOHEXOL  300 MG/ML  SOLN COMPARISON:  Right upper quadrant sonogram dated 04/04/24 FINDINGS: Lower chest: No acute abnormality. Hepatobiliary: Within the posterior dome of right lobe there is a heterogeneous low-density partially exophytic mass measuring 5.7 x 6.2 by 4.5 cm, image 13/2. Gallbladder is normal. No bile duct dilatation. Pancreas: No main duct dilatation or inflammation. No definite mass within the head of pancreas. Spleen: Normal in size without focal abnormality. Adrenals/Urinary Tract: Normal adrenal glands. Cyst in the left kidney measures 3.1 cm, image 31/2. No follow-up imaging recommended. No suspicious mass or obstructive uropathy. Urinary bladder is normal. Stomach/Bowel: Small to moderate hiatal hernia. Along the course of the descending duodenum there is a heterogeneous low-attenuation mass which measures 3.4 by 3.4 by 6.9 cm. There appears to be extraluminal  extension of this mass along the anterior and medial wall of the duodenum at the junction between the descending and horizontal portions of the duodenum, axial image 33. No pathologic dilatation of the large or small bowel loops. The appendix is visualized and appears normal. Colonic diverticulosis without signs of acute diverticulitis. Vascular/Lymphatic: Aortic atherosclerosis. No aneurysm. No signs of abdominopelvic adenopathy. Reproductive: Multiple calcified uterine fibroids. These measure up to 4.2 cm. No adnexal mass. Other: No ascites or focal fluid collections. Musculoskeletal: No acute or significant osseous findings. IMPRESSION: 1. There is a heterogeneous low-density partially exophytic mass within the posterior dome of the right lobe of the liver measuring 5.7 x 6.2 by 4.5 cm. This is concerning for hepatic metastatic disease. 2. There is a heterogeneous low-attenuation mass along the course of the  descending duodenum which measures 3.4 x 3.4 by 6.9 cm. There appears to be extraluminal extension of this mass along the anterior and medial wall of the duodenum at the junction between the descending and horizontal portions of the duodenum. 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 3. Colonic diverticulosis without signs of acute diverticulitis. 4. Small to moderate hiatal hernia. 5. Multiple calcified uterine fibroids. 6.  Aortic Atherosclerosis (ICD10-I70.0). Electronically Signed   By: Waddell Calk M.D.   On: 04/04/2024 13:57   US  Abdomen Limited RUQ (LIVER/GB) Result Date: 04/04/2024 CLINICAL DATA:  Right upper quadrant pain.  History of breast cancer EXAM: ULTRASOUND ABDOMEN LIMITED RIGHT UPPER QUADRANT COMPARISON:  None Available. FINDINGS: Gallbladder: Dilated gallbladder. No shadowing stones. No wall thickening or adjacent fluid. Common bile duct: Diameter: 9 mm.  Borderline enlarged for the patient's age. Liver: Heterogeneous masslike area along the posterior aspect of the right  hepatic lobe measuring 6.4 cm. Is also a mass lesion near the head of the pancreas. This could be pancreatic versus an abnormal lymph node measuring 4.3 cm. Portal vein is patent on color Doppler imaging with normal direction of blood flow towards the liver. Other: None. IMPRESSION: Distended gallbladder. No shadowing stones. Common duct at 9 mm, upper limits of normal for patient's age. However there is a possible mass lesion in the posterior right hepatic lobe as well as lesion centrally towards the pancreatic head. Pancreatic lesion versus an abnormal lymph node is possible. Recommend further evaluation with contrast CT scan of the abdomen pelvis when clinically able. Electronically Signed   By: Ranell Bring M.D.   On: 04/04/2024 11:17     Procedures   Medications Ordered in the ED  iohexol  (OMNIPAQUE ) 300 MG/ML solution 100 mL (100 mLs Intravenous Contrast Given 04/04/24 1326)                                    Medical Decision Making Patient complains of right upper quadrant abdominal pain.  Patient reports symptoms began 3 weeks ago.  Amount and/or Complexity of Data Reviewed Labs: ordered.    Details: Labs ordered reviewed and interpreted.  Creatinine is 1.08 GFR is 55, CBC shows a hemoglobin of 10.7. Radiology: ordered and independent interpretation performed. Decision-making details documented in ED Course.    Details: Ultrasound abdomen ordered reviewed and interpreted.  Ultrasound shows possible pancreatic mass.  Radiologist advised CT scan CT scan shows hepatic lobe mass and duodenal mass  Risk Prescription drug management.        Final diagnoses:  Right upper quadrant abdominal pain  Duodenal mass  Liver mass    ED Discharge Orders          Ordered    HYDROcodone -acetaminophen  (NORCO/VICODIN) 5-325 MG tablet  Every 6 hours PRN        04/04/24 1513               Suhaila Troiano K, PA-C 04/04/24 1513    Dasie Faden, MD 04/05/24 1026

## 2024-04-05 ENCOUNTER — Telehealth: Payer: Self-pay

## 2024-04-05 ENCOUNTER — Inpatient Hospital Stay: Attending: Hematology and Oncology | Admitting: Hematology and Oncology

## 2024-04-05 VITALS — BP 127/81 | HR 83 | Temp 98.0°F | Resp 16 | Ht 62.0 in | Wt 172.9 lb

## 2024-04-05 DIAGNOSIS — F32A Depression, unspecified: Secondary | ICD-10-CM | POA: Insufficient documentation

## 2024-04-05 DIAGNOSIS — I10 Essential (primary) hypertension: Secondary | ICD-10-CM | POA: Insufficient documentation

## 2024-04-05 DIAGNOSIS — F419 Anxiety disorder, unspecified: Secondary | ICD-10-CM | POA: Diagnosis not present

## 2024-04-05 DIAGNOSIS — Z853 Personal history of malignant neoplasm of breast: Secondary | ICD-10-CM | POA: Diagnosis not present

## 2024-04-05 DIAGNOSIS — Z79899 Other long term (current) drug therapy: Secondary | ICD-10-CM | POA: Diagnosis not present

## 2024-04-05 DIAGNOSIS — K769 Liver disease, unspecified: Secondary | ICD-10-CM

## 2024-04-05 DIAGNOSIS — Z9089 Acquired absence of other organs: Secondary | ICD-10-CM | POA: Insufficient documentation

## 2024-04-05 DIAGNOSIS — E785 Hyperlipidemia, unspecified: Secondary | ICD-10-CM | POA: Diagnosis not present

## 2024-04-05 DIAGNOSIS — M1990 Unspecified osteoarthritis, unspecified site: Secondary | ICD-10-CM | POA: Diagnosis not present

## 2024-04-05 DIAGNOSIS — E039 Hypothyroidism, unspecified: Secondary | ICD-10-CM | POA: Insufficient documentation

## 2024-04-05 DIAGNOSIS — R630 Anorexia: Secondary | ICD-10-CM | POA: Insufficient documentation

## 2024-04-05 DIAGNOSIS — Z9221 Personal history of antineoplastic chemotherapy: Secondary | ICD-10-CM | POA: Diagnosis not present

## 2024-04-05 DIAGNOSIS — R634 Abnormal weight loss: Secondary | ICD-10-CM | POA: Diagnosis not present

## 2024-04-05 DIAGNOSIS — C170 Malignant neoplasm of duodenum: Secondary | ICD-10-CM | POA: Diagnosis present

## 2024-04-05 DIAGNOSIS — I341 Nonrheumatic mitral (valve) prolapse: Secondary | ICD-10-CM | POA: Diagnosis not present

## 2024-04-05 DIAGNOSIS — Z885 Allergy status to narcotic agent status: Secondary | ICD-10-CM | POA: Diagnosis not present

## 2024-04-05 MED ORDER — ALPRAZOLAM 0.5 MG PO TABS: 1.0000 mg | ORAL_TABLET | Freq: Two times a day (BID) | ORAL | 1 refills | Status: DC | PRN

## 2024-04-05 MED ORDER — HYDROCODONE-ACETAMINOPHEN 5-325 MG PO TABS: 1.0000 | ORAL_TABLET | Freq: Four times a day (QID) | ORAL | 0 refills | Status: DC | PRN

## 2024-04-05 NOTE — Assessment & Plan Note (Signed)
 Stage 1 left breast cancer 1995 treated with lumpectomy and 19 node left axillary dissection, chemotherapy (possibly 6 cycles adriamycin combination) and radiation. Declined tamoxifen, was on Evista discontinued 2010   Breast cancer surveillance: 1.  Mammogram  12/20/2021: Benign breast density category C 2.  Breast exam  06/20/2022: Benign 3. Breast MRI: 11/08/20: Benign, Density C ------------------------------------------------------------------------------------------------------------------ 04/04/2024: ED visit: Nausea vomiting abdominal pain 04/04/2024: CT abdomen pelvis: Heterogeneous low-density partially exophytic mass posterior dome of right lobe of the liver 6.2 cm, mass descending duodenum 6.9 cm with extraluminal extension along the anterior wall and medial wall of duodenum  Discussion: I discussed with the patient that the findings of the duodenal mass in the liver masses are consistent with malignant process. Plan: Gastroenterology was informed and they are planning to do an upper endoscopy I will consult interventional radiology for liver biopsy Blood work and tumor markers to be done today. Return to clinic after the biopsy to discuss her treatment plan.  If this is a GI malignancy I would like the patient to see one of her colleagues with GI oncology.

## 2024-04-05 NOTE — Telephone Encounter (Signed)
 Pt called and LVM today asking about further testing before her MD appt today. Called pt to gather more information about her request but unable to reach.

## 2024-04-05 NOTE — Progress Notes (Signed)
 Patient Care Team: Shelda Atlas, MD as PCP - General (Internal Medicine) Tobb, Kardie, DO as PCP - Cardiology (Cardiology) Rollin Dover, MD as Consulting Physician (Gastroenterology) Gretta Bonnie RAMAN, MD (Inactive) as Consulting Physician (Internal Medicine) Manda Fess, MD as Consulting Physician (Obstetrics and Gynecology)  DIAGNOSIS:  Encounter Diagnosis  Name Primary?   NEOPLASM, MALIGNANT, BREAST, HX OF Yes    CHIEF COMPLIANT: Follow-up to discuss results of recent CT scans  HISTORY OF PRESENT ILLNESS:  History of Present Illness Elaine Simon is a 73 year old female with a history of breast cancer who presents with nausea, vomiting, and abdominal pain to the emergency room.   Nausea, vomiting, and abdominal pain have persisted for three weeks. The pain is localized to the liver area with a severity of 6 out of 10 and is exacerbated by stress and physical activity. Imaging studies from the emergency room identified two growths, one in the liver and another in the duodenum.  She experiences significant anxiety related to her symptoms and imaging findings. She has a history of anxiety and has been prescribed Xanax  in the past.  She was prescribed hydrocodone  for pain management but encountered an issue with the prescription being sent to the wrong pharmacy, receiving only two tablets. She seeks a new prescription to be filled at a more convenient location.  There is a weight loss of approximately ten pounds over the last three weeks. Bowel movements have been irregular, with episodes of diarrhea and constipation, managed with oxy powder.     ALLERGIES:  is allergic to adhesive [tape] and codeine.  MEDICATIONS:  Current Outpatient Medications  Medication Sig Dispense Refill   ALPRAZolam  (XANAX ) 1 MG tablet Take 1 tablet by mouth daily as needed.     ASPIRIN  LOW DOSE 81 MG EC tablet TAKE 1 TABLET(81 MG) BY MOUTH DAILY. SWALLOW WHOLE 90 tablet 3   diclofenac   sodium (VOLTAREN ) 1 % GEL Apply 2 g topically 4 (four) times daily. 1 Tube 0   diltiazem  (CARDIZEM  CD) 120 MG 24 hr capsule Take 1 capsule (120 mg total) by mouth daily. 90 capsule 3   docusate sodium (COLACE) 100 MG capsule Take 100 mg by mouth daily as needed for mild constipation.     fluticasone (FLONASE) 50 MCG/ACT nasal spray Place 2 sprays into both nostrils daily as needed.     gabapentin  (NEURONTIN ) 300 MG capsule Take 300 mg by mouth 3 (three) times daily.     HYDROcodone -acetaminophen  (NORCO/VICODIN) 5-325 MG tablet Take 1 tablet by mouth every 6 (six) hours as needed for moderate pain (pain score 4-6). 30 tablet 0   HYDROcodone -acetaminophen  (NORCO/VICODIN) 5-325 MG tablet Take 1 tablet by mouth every 6 (six) hours as needed. 2 tablet 0   meloxicam  (MOBIC ) 15 MG tablet Take 1 tablet by mouth daily.     phentermine (ADIPEX-P) 37.5 MG tablet Take 1 tablet by mouth daily.     rosuvastatin (CRESTOR) 40 MG tablet Take 40 mg by mouth daily.     traZODone (DESYREL) 150 MG tablet Take 150 mg by mouth at bedtime as needed.     Vitamin D , Ergocalciferol , (DRISDOL) 1.25 MG (50000 UNIT) CAPS capsule Take 1 capsule by mouth once a week.     No current facility-administered medications for this visit.    PHYSICAL EXAMINATION: ECOG PERFORMANCE STATUS: 1 - Symptomatic but completely ambulatory  There were no vitals filed for this visit. There were no vitals filed for this visit.  Physical Exam   (  exam performed in the presence of a chaperone)  LABORATORY DATA:  I have reviewed the data as listed    Latest Ref Rng & Units 04/04/2024    9:32 AM 10/01/2023   10:23 AM 12/12/2022   12:00 AM  CMP  Glucose 70 - 99 mg/dL 874  97  95   BUN 8 - 23 mg/dL 20  16  12    Creatinine 0.44 - 1.00 mg/dL 8.91  9.06  9.14   Sodium 135 - 145 mmol/L 140  143  141   Potassium 3.5 - 5.1 mmol/L 3.7  4.7  4.1   Chloride 98 - 111 mmol/L 108  105  106   CO2 22 - 32 mmol/L 21  25  22    Calcium 8.9 - 10.3 mg/dL  9.6  89.9  9.7   Total Protein 6.5 - 8.1 g/dL 7.1  6.9  7.2   Total Bilirubin 0.0 - 1.2 mg/dL 0.4  <9.7  0.6   Alkaline Phos 38 - 126 U/L 95  89    AST 15 - 41 U/L 29  19  16    ALT 0 - 44 U/L 30  21  15      Lab Results  Component Value Date   WBC 5.6 04/04/2024   HGB 10.7 (L) 04/04/2024   HCT 34.1 (L) 04/04/2024   MCV 90.0 04/04/2024   PLT 343 04/04/2024   NEUTROABS 3.3 04/04/2024    ASSESSMENT & PLAN:  NEOPLASM, MALIGNANT, BREAST, HX OF Stage 1 left breast cancer 1995 treated with lumpectomy and 19 node left axillary dissection, chemotherapy (possibly 6 cycles adriamycin combination) and radiation. Declined tamoxifen, was on Evista discontinued 2010   Breast cancer surveillance: 1.  Mammogram  12/20/2021: Benign breast density category C 2.  Breast exam  06/20/2022: Benign 3. Breast MRI: 11/08/20: Benign, Density C ------------------------------------------------------------------------------------------------------------------ 04/04/2024: ED visit: Nausea vomiting abdominal pain 04/04/2024: CT abdomen pelvis: Heterogeneous low-density partially exophytic mass posterior dome of right lobe of the liver 6.2 cm, mass descending duodenum 6.9 cm with extraluminal extension along the anterior wall and medial wall of duodenum  Discussion: I discussed with the patient that the findings of the duodenal mass in the liver masses are consistent with malignant process. Plan: Gastroenterology was informed and they are planning to do an upper endoscopy (Dr. Rollin at 8 AM on 04/06/2024) I will consult interventional radiology for liver biopsy Telephone visit on 04/07/2024 after the biopsy to discuss her treatment plan.  If this is a GI malignancy I would like the patient to see one of her colleagues with GI oncology.  Severe pain right upper quadrant: Sent a prescription for hydrocodone  Severe anxiety: Sent a prescription for Xanax . Will request for chaplain to talk to her. Assessment & Plan Liver  lesion, possible neoplasm CT and ultrasound showed a solitary 6 cm liver lesion. Differential includes neoplasm, infection, or inflammation. Solitary nature atypical for metastatic breast cancer. - Request liver biopsy. - Coordinate with radiologist for biopsy procedure.  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 Differential includes primary duodenal cancer or metastatic disease. Biopsy required for diagnosis. - Proceed with endoscopic biopsy of duodenal mass with Dr. Phebe on April 06, 2024.  Malignant neoplasm of breast, estrogen receptor positive Previous treatment involved lymph node removal. Current findings suggest new primary cancer.  Abdominal pain with nausea and vomiting Pain, nausea, and vomiting began three weeks ago, localized to liver area, severity 6/10. - Prescribe hydrocodone  for pain management. - Send  prescription to [facility] on [address].  Anxiety Increased anxiety due to health developments. Previously managed with Xanax . - Use Xanax  as needed for anxiety management.      No orders of the defined types were placed in this encounter.  The patient has a good understanding of the overall plan. she agrees with it. she will call with any problems that may develop before the next visit here. Total time spent: 45 mins including face to face time and time spent for planning, charting and co-ordination of care   Elaine MARLA Chad, MD 04/05/24

## 2024-04-06 ENCOUNTER — Encounter (HOSPITAL_COMMUNITY): Payer: Self-pay

## 2024-04-06 ENCOUNTER — Telehealth: Payer: Self-pay | Admitting: Hematology and Oncology

## 2024-04-06 ENCOUNTER — Telehealth: Payer: Self-pay

## 2024-04-06 NOTE — Telephone Encounter (Signed)
 Attempting to reach pt to advise she needs liver bx scheduled, and the scheduler is trying to reach her. Pt did not answer and her VM box is full. Attempted to call pt's emergency contacts; her mother and sister, whose phone state this is not a working number.   Will continue to attempt reaching pt. Referral message sent to our chaplain, Olam Corrigan to offer pt support.

## 2024-04-06 NOTE — Telephone Encounter (Signed)
 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  Dr. Rollin informed me of that. We need to wait for the immunostains to figure out if it is duodenal cancer or breast cancer. Once that is available then we can discuss treatment options.

## 2024-04-06 NOTE — Progress Notes (Signed)
 Elaine [name], MD  [name] PROCEDURE / BIOPSY REVIEW Date: 04/06/24  Requested Biopsy site: liver Reason for request: liver mass right lobe Imaging review: Best seen on CT  Decision: Approved Imaging modality to perform: Ultrasound Schedule with: Moderate Sedation Schedule for: Any VIR  Additional comments: @Schedulers .  Please contact me with questions, concerns, or if issue pertaining to this request arise.  [name] [name], MD [facility]       Previous Messages    ----- Message ----- From: Myria Steenbergen Sent: 04/06/2024   8:21 AM EDT To: Ayannah Faddis; Ir Procedure Requests Subject: US  liver biopsy                                Procedure : US  liver biopsy  Reason: liver lesion Dx: Liver lesion, right lobe [K76.9 (ICD-10-CM)]    History : CT abd pelvis w/ , US  abd limited  Provider : Odean Potts, MD  Contact : [phone]

## 2024-04-07 ENCOUNTER — Other Ambulatory Visit: Payer: Self-pay | Admitting: Radiology

## 2024-04-07 ENCOUNTER — Encounter: Payer: Self-pay | Admitting: General Practice

## 2024-04-07 DIAGNOSIS — K769 Liver disease, unspecified: Secondary | ICD-10-CM

## 2024-04-07 NOTE — Progress Notes (Signed)
 [facility] Spiritual Care Note  Visited with Elaine Simon, who goes by Elaine Simon, by phone to introduce Spiritual Care and build rapport. She welcomed opportunity to process the news of likely metastasis and upcoming biopsy, which is understandably stirring up anger and sadness. 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  She also notes that one of her coping mechanisms is sleeping when she feels depressed. Other key coping tools include faith and prayer. She requested prayer, finding comfort and strength in that act of care and support.  We plan to follow up by phone the evening of her follow-up conversation with Dr Gudena (04/15/2024), and Elaine Simon has direct Spiritual Care number in case she would like to schedule a visit before then.   04/07/24 0900  Spiritual Encounters  Type of Visit Initial  Care provided to: Patient  Referral source Physician  Reason for visit Urgent spiritual support  Spiritual Framework  Presenting Themes Caregiving needs;Significant life change;Community and relationships  Values/beliefs faith and prayer important  Community/Connection Family  Strengths self-reflection, prayer  Needs/Challenges/Barriers coping with anger; desires more support  Patient Stress Factors Health changes;Loss of control;Lack of caregivers;Major life changes (primary caregiver for sister, who had stroke)  Goals  Clinical Care Goals provide assistance for ongoing processing and identification of goals/hopes; social/emotional/spiritual support  Interventions  Spiritual Care Interventions Made Established relationship of care and support;Compassionate presence;Reflective listening;Normalization of emotions;Explored values/beliefs/practices/strengths;Prayer;Supported grief process ((pt grieving old normal, prior to possible metastasis))   Chaplain Olam Filiberto Lemming, [facility] Pager [phone] Voicemail  [phone]

## 2024-04-07 NOTE — H&P (Signed)
 Chief Complaint: Remote left breast cancer, duodenal mass, liver mass; referred for image guided liver mass biopsy for further evaluation  Referring Provider(s): Gudena,V  Supervising Physician: Karalee Beat  Patient Status: [facility] - Out-pt  History of Present Illness: Elaine Simon is a 73 y.o. female with past medical history significant for anxiety, depression, hyperlipidemia, diverticulosis, hiatal hernia, uterine fibroids, hypertension, hypothyroidism, osteoarthritis, panic attacks, remote left breast cancer who presents now with recent imaging revealing right lobe liver mass, duodenal mass.  She is scheduled today for image guided liver mass biopsy for further evaluation.  *** Patient is Full Code  Past Medical History:  Diagnosis Date   Anxiety    Breast cancer (HCC) 1995   Cataract immature    bilat.   Depression    Hay fever    Hx of mitral valve prolapse    has had no problems   Hyperlipidemia    Hypertension    Hypothyroidism    Insomnia    Osteoarthritis    Panic attacks    Personal history of chemotherapy    Personal history of radiation therapy 1995   Snores    states occ. apnea, occ. wakes up coughing; has never had sleep study;  denies daytime sleepiness    Past Surgical History:  Procedure Laterality Date   BREAST BIOPSY Right 06/2023   BREAST CYST ASPIRATION Left 02/18/2023   abscess   BREAST LUMPECTOMY Left 1995   left   BREAST RECONSTRUCTION  04/01/2012   Procedure: BREAST RECONSTRUCTION;  Surgeon: Estefana Reichert, DO;  Location: [facility];  Service: Plastics;  Laterality: Right;  Right breast mastopexy reduction    GANGLION CYST EXCISION     LYMPH NODE DISSECTION     REDUCTION MAMMAPLASTY Bilateral    TONSILLECTOMY     UTERINE FIBROID SURGERY      Allergies: Adhesive [tape] and Codeine  Medications: Prior to Admission medications   Medication Sig Start Date End Date Taking? Authorizing Provider  ALPRAZolam   (XANAX ) 0.5 MG tablet Take 2 tablets (1 mg total) by mouth 2 (two) times daily as needed. 04/05/24   Odean Potts, MD  ASPIRIN  LOW DOSE 81 MG EC tablet TAKE 1 TABLET(81 MG) BY MOUTH DAILY. SWALLOW WHOLE 02/27/21   Patwardhan, Newman PARAS, MD  Cholecalciferol (VITAMIN D ) 10 MCG/ML LIQD Vitamin D     [provider]  diclofenac  sodium (VOLTAREN ) 1 % GEL Apply 2 g topically 4 (four) times daily. 08/10/17   Babara, Amy V, PA-C  diltiazem  (CARDIZEM  CD) 120 MG 24 hr capsule Take 1 capsule (120 mg total) by mouth daily. 10/01/23   Tobb, Kardie, DO  docusate sodium (COLACE) 100 MG capsule Take 100 mg by mouth daily as needed for mild constipation.    [provider]  fluticasone (FLONASE) 50 MCG/ACT nasal spray Place 2 sprays into both nostrils daily as needed. 06/23/19   [provider]  gabapentin  (NEURONTIN ) 300 MG capsule Take 300 mg by mouth 3 (three) times daily.    [provider]  HYDROcodone -acetaminophen  (NORCO/VICODIN) 5-325 MG tablet Take 1 tablet by mouth every 6 (six) hours as needed for moderate pain (pain score 4-6). 04/04/24   Sofia, Leslie K, PA-C  HYDROcodone -acetaminophen  (NORCO/VICODIN) 5-325 MG tablet Take 1 tablet by mouth every 6 (six) hours as needed. 04/05/24   Odean Potts, MD  meloxicam  (MOBIC ) 15 MG tablet Take 1 tablet by mouth daily. 05/25/20   [provider]  phentermine (ADIPEX-P) 37.5 MG tablet Take 1  tablet by mouth daily. 06/10/19   [provider]  rosuvastatin (CRESTOR) 40 MG tablet Take 40 mg by mouth daily. 08/31/20   [provider]  rosuvastatin (CRESTOR) 5 MG tablet 5 mg.    [provider]  traZODone (DESYREL) 150 MG tablet Take 150 mg by mouth at bedtime as needed. 08/19/20   [provider]  Vitamin D , Ergocalciferol , (DRISDOL) 1.25 MG (50000 UNIT) CAPS capsule Take 1 capsule by mouth once a week. 09/27/20   [provider]     Family History  Problem Relation Age of Onset   Heart  disease Other    Diabetes Other    Breast cancer Other    Hypertension Mother    Cancer Father 44       prostate   Heart disease Father    Heart attack Father    Cancer Brother 21       prostate   Cancer Maternal Aunt 60       kidney   Cancer Maternal Grandfather 50       stomach   Hypertension Brother     Social History   Socioeconomic History   Marital status: Single    Spouse name: Not on file   Number of children: 0   Years of education: Not on file   Highest education level: Not on file  Occupational History   Not on file  Tobacco Use   Smoking status: Never   Smokeless tobacco: Never  Vaping Use   Vaping status: Never Used  Substance and Sexual Activity   Alcohol use: Yes    Comment: occasionally   Drug use: No   Sexual activity: Not on file  Other Topics Concern   Not on file  Social History Narrative   Not on file   Social Drivers of Health   Financial Resource Strain: Not on file  Food Insecurity: Food Insecurity Present (04/02/2024)   Hunger Vital Sign    Worried About Running Out of Food in the Last Year: Sometimes true    Ran Out of Food in the Last Year: Sometimes true  Transportation Needs: No Transportation Needs (10/24/2023)   PRAPARE - Administrator, Civil Service (Medical): No    Lack of Transportation (Non-Medical): No  Physical Activity: Not on file  Stress: Not on file  Social Connections: Unknown (01/25/2022)   Received from [facility]   Social Network    Social Network: Not on file       Review of Systems  Vital Signs:   Advance Care Plan: No documents on file  Physical Exam  Imaging: CT ABDOMEN PELVIS W CONTRAST Result Date: 04/04/2024 CLINICAL DATA:  Abdominal pain. Evaluate mass seen on ultrasound. * Tracking Code: BO * EXAM: CT ABDOMEN AND PELVIS WITH CONTRAST TECHNIQUE: Multidetector CT imaging of the abdomen and pelvis was performed using the standard protocol following bolus administration of  intravenous contrast. RADIATION DOSE REDUCTION: This exam was performed according to the departmental dose-optimization program which includes automated exposure control, adjustment of the mA and/or kV according to patient size and/or use of iterative reconstruction technique. CONTRAST:  OMNIPAQUE  IOHEXOL  300 MG/ML  SOLN COMPARISON:  Right upper quadrant sonogram dated 04/04/24 FINDINGS: Lower chest: No acute abnormality. Hepatobiliary: Within the posterior dome of right lobe there is a heterogeneous low-density partially exophytic mass measuring 5.7 x 6.2 by 4.5 cm, image 13/2. Gallbladder is normal. No bile duct dilatation. Pancreas: No main duct dilatation or inflammation. No  definite mass within the head of pancreas. Spleen: Normal in size without focal abnormality. Adrenals/Urinary Tract: Normal adrenal glands. Cyst in the left kidney measures 3.1 cm, image 31/2. No follow-up imaging recommended. No suspicious mass or obstructive uropathy. Urinary bladder is normal. Stomach/Bowel: Small to moderate hiatal hernia. Along the course of the descending duodenum there is a heterogeneous low-attenuation mass which measures 3.4 by 3.4 by 6.9 cm. There appears to be extraluminal extension of this mass along the anterior and medial wall of the duodenum at the junction between the descending and horizontal portions of the duodenum, axial image 33. No pathologic dilatation of the large or small bowel loops. The appendix is visualized and appears normal. Colonic diverticulosis without signs of acute diverticulitis. Vascular/Lymphatic: Aortic atherosclerosis. No aneurysm. No signs of abdominopelvic adenopathy. Reproductive: Multiple calcified uterine fibroids. These measure up to 4.2 cm. No adnexal mass. Other: No ascites or focal fluid collections. Musculoskeletal: No acute or significant osseous findings. IMPRESSION: 1. There is a heterogeneous low-density partially exophytic mass within the posterior dome of the  right lobe of the liver measuring 5.7 x 6.2 by 4.5 cm. This is concerning for hepatic metastatic disease. 2. There is a heterogeneous low-attenuation mass along the course of the descending duodenum which measures 3.4 x 3.4 by 6.9 cm. There appears to be extraluminal extension of this mass along the anterior and medial wall of the duodenum at the junction between the descending and horizontal portions of the duodenum. 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 3. Colonic diverticulosis without signs of acute diverticulitis. 4. Small to moderate hiatal hernia. 5. Multiple calcified uterine fibroids. 6.  Aortic Atherosclerosis (ICD10-I70.0). Electronically Signed   By: Waddell Calk M.D.   On: 04/04/2024 13:57   US  Abdomen Limited RUQ (LIVER/GB) Result Date: 04/04/2024 CLINICAL DATA:  Right upper quadrant pain.  History of breast cancer EXAM: ULTRASOUND ABDOMEN LIMITED RIGHT UPPER QUADRANT COMPARISON:  None Available. FINDINGS: Gallbladder: Dilated gallbladder. No shadowing stones. No wall thickening or adjacent fluid. Common bile duct: Diameter: 9 mm.  Borderline enlarged for the patient's age. Liver: Heterogeneous masslike area along the posterior aspect of the right hepatic lobe measuring 6.4 cm. Is also a mass lesion near the head of the pancreas. This could be pancreatic versus an abnormal lymph node measuring 4.3 cm. Portal vein is patent on color Doppler imaging with normal direction of blood flow towards the liver. Other: None. IMPRESSION: Distended gallbladder. No shadowing stones. Common duct at 9 mm, upper limits of normal for patient's age. However there is a possible mass lesion in the posterior right hepatic lobe as well as lesion centrally towards the pancreatic head. Pancreatic lesion versus an abnormal lymph node is possible. Recommend further evaluation with contrast CT scan of the abdomen pelvis when clinically able. Electronically Signed   By: Ranell Bring M.D.   On: 04/04/2024 11:17     Labs:  CBC: Recent Labs    04/04/24 0932  WBC 5.6  HGB 10.7*  HCT 34.1*  PLT 343    COAGS: No results for input(s): INR, APTT in the last 8760 hours.  BMP: Recent Labs    10/01/23 1023 04/04/24 0932  NA 143 140  K 4.7 3.7  CL 105 108  CO2 25 21*  GLUCOSE 97 125*  BUN 16 20  CALCIUM 10.0 9.6  CREATININE 0.93 1.08*  GFRNONAA  --  55*    LIVER FUNCTION TESTS: Recent Labs    10/01/23 1023 04/04/24 0932  BILITOT <  0.2 0.4  AST 19 29  ALT 21 30  ALKPHOS 89 95  PROT 6.9 7.1  ALBUMIN 4.5 3.6    TUMOR MARKERS: No results for input(s): AFPTM, CEA, CA199, CHROMGRNA in the last 8760 hours.  Assessment and Plan: 73 y.o. female with past medical history significant for anxiety, depression, hyperlipidemia, diverticulosis, hiatal hernia, uterine fibroids, hypertension, hypothyroidism, osteoarthritis, panic attacks, remote left breast cancer who presents now with recent imaging revealing right lobe liver mass, duodenal mass.  She is scheduled today for image guided liver mass biopsy for further evaluation.Risks and benefits of procedure was discussed with the patient  including, but not limited to bleeding, infection, damage to adjacent structures or low yield requiring additional tests.  All of the questions were answered and there is agreement to proceed.  Consent signed and in chart.    Thank you for allowing our service to participate in Elaine Simon 's care.  Electronically Signed: D. Franky Rakers, PA-C   04/07/2024, 3:33 PM      I spent a total of  25 minutes   in face to face in clinical consultation, greater than 50% of which was counseling/coordinating care for image guided liver mass biopsy

## 2024-04-08 ENCOUNTER — Encounter (HOSPITAL_COMMUNITY): Payer: Self-pay

## 2024-04-08 ENCOUNTER — Ambulatory Visit (HOSPITAL_COMMUNITY)
Admission: RE | Admit: 2024-04-08 | Discharge: 2024-04-08 | Disposition: A | Discharge status: Home / Self Care | Source: Ambulatory Visit | Attending: Hematology and Oncology | Admitting: Hematology and Oncology

## 2024-04-08 ENCOUNTER — Other Ambulatory Visit: Payer: Self-pay

## 2024-04-08 DIAGNOSIS — K769 Liver disease, unspecified: Secondary | ICD-10-CM | POA: Insufficient documentation

## 2024-04-08 DIAGNOSIS — K449 Diaphragmatic hernia without obstruction or gangrene: Secondary | ICD-10-CM | POA: Diagnosis not present

## 2024-04-08 DIAGNOSIS — E785 Hyperlipidemia, unspecified: Secondary | ICD-10-CM | POA: Diagnosis not present

## 2024-04-08 DIAGNOSIS — Z853 Personal history of malignant neoplasm of breast: Secondary | ICD-10-CM | POA: Insufficient documentation

## 2024-04-08 DIAGNOSIS — K5730 Diverticulosis of large intestine without perforation or abscess without bleeding: Secondary | ICD-10-CM | POA: Insufficient documentation

## 2024-04-08 DIAGNOSIS — I10 Essential (primary) hypertension: Secondary | ICD-10-CM | POA: Insufficient documentation

## 2024-04-08 DIAGNOSIS — F419 Anxiety disorder, unspecified: Secondary | ICD-10-CM | POA: Insufficient documentation

## 2024-04-08 DIAGNOSIS — F32A Depression, unspecified: Secondary | ICD-10-CM | POA: Insufficient documentation

## 2024-04-08 HISTORY — PX: IR US LIVER BIOPSY: IMG936

## 2024-04-08 LAB — CBC WITH DIFFERENTIAL/PLATELET
Abs Immature Granulocytes: 0.01 K/uL (ref 0.00–0.07)
Basophils Absolute: 0 K/uL (ref 0.0–0.1)
Basophils Relative: 1 %
Eosinophils Absolute: 0.1 K/uL (ref 0.0–0.5)
Eosinophils Relative: 1 %
HCT: 35.8 % — ABNORMAL LOW (ref 36.0–46.0)
Hemoglobin: 10.9 g/dL — ABNORMAL LOW (ref 12.0–15.0)
Immature Granulocytes: 0 %
Lymphocytes Relative: 38 %
Lymphs Abs: 2.3 K/uL (ref 0.7–4.0)
MCH: 27.3 pg (ref 26.0–34.0)
MCHC: 30.4 g/dL (ref 30.0–36.0)
MCV: 89.5 fL (ref 80.0–100.0)
Monocytes Absolute: 0.4 K/uL (ref 0.1–1.0)
Monocytes Relative: 7 %
Neutro Abs: 3.1 K/uL (ref 1.7–7.7)
Neutrophils Relative %: 53 %
Platelets: 407 K/uL — ABNORMAL HIGH (ref 150–400)
RBC: 4 MIL/uL (ref 3.87–5.11)
RDW: 14.1 % (ref 11.5–15.5)
WBC: 5.9 K/uL (ref 4.0–10.5)
nRBC: 0 % (ref 0.0–0.2)

## 2024-04-08 LAB — COMPREHENSIVE METABOLIC PANEL WITH GFR
ALT: 23 U/L (ref 0–44)
AST: 19 U/L (ref 15–41)
Albumin: 3.6 g/dL (ref 3.5–5.0)
Alkaline Phosphatase: 98 U/L (ref 38–126)
Anion gap: 10 (ref 5–15)
BUN: 12 mg/dL (ref 8–23)
CO2: 21 mmol/L — ABNORMAL LOW (ref 22–32)
Calcium: 9.7 mg/dL (ref 8.9–10.3)
Chloride: 110 mmol/L (ref 98–111)
Creatinine, Ser: 0.89 mg/dL (ref 0.44–1.00)
GFR, Estimated: >60 mL/min (ref >60)
Glucose, Bld: 115 mg/dL — ABNORMAL HIGH (ref 70–99)
Potassium: 3.9 mmol/L (ref 3.5–5.1)
Sodium: 141 mmol/L (ref 135–145)
Total Bilirubin: 0.5 mg/dL (ref 0.0–1.2)
Total Protein: 7.3 g/dL (ref 6.5–8.1)

## 2024-04-08 LAB — PROTIME-INR
INR: 1 (ref 0.8–1.2)
Prothrombin Time: 13.7 s (ref 11.4–15.2)

## 2024-04-08 MED ORDER — SODIUM CHLORIDE 0.9 % IV SOLN: INTRAVENOUS | Status: DC

## 2024-04-08 MED ORDER — FENTANYL CITRATE (PF) 100 MCG/2ML IJ SOLN
INTRAMUSCULAR | Status: AC
  Filled 2024-04-08: qty 2

## 2024-04-08 MED ORDER — OXIDIZED CELLULOSE EX PADS
1.0000 | MEDICATED_PAD | Freq: Once | CUTANEOUS | Status: DC
  Filled 2024-04-08: qty 1

## 2024-04-08 MED ORDER — GELATIN ABSORBABLE 12-7 MM EX MISC
1.0000 | Freq: Once | CUTANEOUS | Status: AC
  Administered 2024-04-08: 1 via TOPICAL
  Filled 2024-04-08: qty 1

## 2024-04-08 MED ORDER — MIDAZOLAM HCL 2 MG/2ML IJ SOLN
INTRAMUSCULAR | Status: AC
  Filled 2024-04-08: qty 2

## 2024-04-08 MED ORDER — LIDOCAINE-EPINEPHRINE 1 %-1:100000 IJ SOLN
INTRAMUSCULAR | Status: AC
Start: 2024-04-08 — End: 2024-04-08
  Filled 2024-04-08: qty 1

## 2024-04-08 MED ORDER — FENTANYL CITRATE (PF) 100 MCG/2ML IJ SOLN
INTRAMUSCULAR | Status: AC | PRN
  Administered 2024-04-08 (×2): 50 ug via INTRAVENOUS

## 2024-04-08 MED ORDER — MIDAZOLAM HCL 2 MG/2ML IJ SOLN
INTRAMUSCULAR | Status: AC | PRN
Start: 2024-04-08 — End: 2024-04-08
  Administered 2024-04-08 (×2): 1 mg via INTRAVENOUS

## 2024-04-08 MED ORDER — LIDOCAINE-EPINEPHRINE 1 %-1:100000 IJ SOLN
20.0000 mL | Freq: Once | INTRAMUSCULAR | Status: AC
  Administered 2024-04-08: 10 mL via INTRADERMAL

## 2024-04-08 MED ORDER — GELATIN ABSORBABLE 12-7 MM EX MISC
CUTANEOUS | Status: AC
  Filled 2024-04-08: qty 1

## 2024-04-08 NOTE — Procedures (Signed)
Interventional Radiology Procedure Note  Procedure: US guided core biopsy of liver mass.   Complications: None  Estimated Blood Loss: None  Recommendations: - Bedrest x 2 hrs - DC home    Signed,  Olufemi Mofield K. Kaidynce Pfister, MD    

## 2024-04-08 NOTE — OR Nursing (Signed)
 Daughter of patient showed up in Short Stay post IR-recovery, per patients she did not want anyone to know information regarding procedure or results at this time. Informed patient we do not call daughter or disclose any information. Only call that was placed was to brother Ozell for transportation/pick up information. Informed patient if she wants chart to be private she needs to inform registration ahead.

## 2024-04-08 NOTE — Discharge Instructions (Signed)
Please call Interventional Radiology clinic [phone] with any questions or concerns.  You may remove your dressing and shower tomorrow.  After the procedure, it is common to have: Soreness Bruising Mild pain You may also feel tired for a few days  Follow these instructions at home:  Medication: Do not use Aspirin or ibuprofen products, such as Advil or Motrin, as it may increase bleeding.  You may resume your usual medications as ordered by your doctor If your doctor prescribed antibiotics, take them as directed. Do not stop taking them just because you feel better. You need to take the full course of antibiotics.  Eating and drinking: Drink plenty of liquids to keep your urine pale yellow You can resume your regular diet as directed by your doctor  Care of the procedure site Follow instructions from your doctor about how to take care of your cut from surgery (incisions). Make sure you: Wash your hands with soap and water for at least 20 seconds before and after you change your bandage. If you cannot use soap and water, use hand sanitizer Change your bandage Leave stitches or skin glue in place for at least two weeks Leave tape strips alone unless you are told to take them off. You may trim the edges of the tape strips if they curl up. Check your incision every day for signs of infection. Check for: Redness, swelling, or more pain Fluid or blood Warmth Pus or a bad smell  Activity Rest at home for 1-2 days, or as told by your doctor Get up to take short walks every 1 to 2 hours. Ask for help if you feel weak or unsteady Do not lift anything that is heavier than 10 lb (4.5 kg), or the limit that you are told Do not play contact sports for 2 weeks after the procedure Return to your normal activities as told by your doctor Do not take baths, swim, or use a hot tub until your health care provider approves. Take showers only. Keep all follow-up visits as told by your  doctor  Contact a doctor if: You have more bleeding in your incision Your incision swells, or is red and more painful You have fluid that comes from your incision You develop a rash You have fever or chills  Get help right away if: You have swelling, bloating, or pain in your belly (abdomen) You get dizzy or faint You vomit or you feel like vomiting You have trouble breathing or feel short of breath You have chest pain You have problems talking or seeing You have trouble with your balance or moving your arms or legs  Moderate Conscious Sedation-Care After  This sheet gives you information about how to care for yourself after your procedure. Your health care provider may also give you more specific instructions. If you have problems or questions, contact your health care provider.  After the procedure, it is common to have: Sleepiness for several hours. Impaired judgment for several hours. Difficulty with balance. Vomiting if you eat too soon.  Follow these instructions at home:  Rest. Do not participate in activities where you could fall or become injured. Do not drive or use machinery. Do not drink alcohol. Do not take sleeping pills or medicines that cause drowsiness. Do not make important decisions or sign legal documents. Do not take care of children on your own.  Eating and drinking Follow the diet recommended by your health care provider. Drink enough fluid to keep your urine pale yellow.  If you vomit: Drink water, juice, or soup when you can drink without vomiting. Make sure you have little or no nausea before eating solid foods.  General instructions Take over-the-counter and prescription medicines only as told by your health care provider. Have a responsible adult stay with you for the time you are told. It is important to have someone help care for you until you are awake and alert. Do not smoke. Keep all follow-up visits as told by your health care  provider. This is important.  Contact a health care provider if: You are still sleepy or having trouble with balance after 24 hours. You feel light-headed. You keep feeling nauseous or you keep vomiting. You develop a rash. You have a fever. You have redness or swelling around the IV site.  Get help right away if: You have trouble breathing. You have new-onset confusion at home.  This information is not intended to replace advice given to you by your health care provider. Make sure you discuss any questions you have with your healthcare provider.

## 2024-04-09 ENCOUNTER — Telehealth: Payer: Self-pay | Admitting: *Deleted

## 2024-04-09 ENCOUNTER — Other Ambulatory Visit: Payer: Self-pay | Admitting: *Deleted

## 2024-04-09 DIAGNOSIS — C50919 Malignant neoplasm of unspecified site of unspecified female breast: Secondary | ICD-10-CM

## 2024-04-09 DIAGNOSIS — K769 Liver disease, unspecified: Secondary | ICD-10-CM

## 2024-04-09 NOTE — Progress Notes (Signed)
 Received message from MD requesting referral for radiation oncology. MD states he will reach out to pt tomorrow to further discuss.  Orders placed, pt notified and verbalized understanding.

## 2024-04-09 NOTE — Telephone Encounter (Signed)
 This RN spoke with pt per MD request to alert her that he is planning on calling her tomorrow with information on recent biopsy of new mass adjacent to her bowel- and due to concern for blockage he wants a referral to be placed today with Rad Onc so we can proceed with radiation therapy as soon as possible.  Pt verbalized understanding.  She states her last BM was approximately 3 days ago.  She state she usually has at least 1 BM if not more a day.  She states some low abd cramping.  She states she is not passing gas.  She denies any nausea or vomiting.  Note current time of discussion was at 59- this RN inquired if pt could come in for xray for evaluation of bowel - with pt stating she likely would not be able to come promptly.  Note during conversation with this RN she also received a call from her GI MD - this RN disconnected call.  Post 15 minutes this RN called pt back and resumed above discussion including Dr Gara recommendation of use of suppositories only and to maintain soft/liquid diet.  She did discuss bowel issue with GI MD who gave same advice as Dr Odean.  Reviewed with pt signs concerning for a bowel blockage including developing worsening abd cramping and or onset of nausea and vomiting.  If above occurs she should proceed to the ER.  Pt stated understanding as well concern - this RN validated her concerns.  No further needs at this time.

## 2024-04-10 ENCOUNTER — Telehealth: Payer: Self-pay | Admitting: Hematology and Oncology

## 2024-04-10 NOTE — Telephone Encounter (Signed)
 I spoke with [name] about the pathology preliminary result from the EGD and biopsy.  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  Liver biopsy was also done last week and results are pending.  To prevent obstruction: I encouraged her to eat liquid and soft diet. I discussed with Dr. Rollin who said that it is not possible to stent the duodenum because he was not able to pass through the obstruction during the endoscopy. I requested Dr. Cloretta to see the patient next week to talk about systemic chemotherapy options. We will request MSI testing and foundation one analysis on the pathology specimen. We will request for port placement. I discussed with surgery who suggested that if she is not obstructed we should treat her medically because surgical gastrojejunostomy would delay her systemic chemotherapy substantially. She has an appointment with radiation oncology next Tuesday.  I will ask Dr. Cloretta if radiation should be considered while we are waiting for starting chemotherapy.  Patient understands that it may take at least 2 weeks for us  to get started with her systemic treatment plan. In the meantime if she has any symptoms of nausea and vomiting, she needs to call us  and most likely come to the emergency room.

## 2024-04-12 ENCOUNTER — Other Ambulatory Visit: Payer: Self-pay

## 2024-04-12 ENCOUNTER — Encounter: Payer: Self-pay | Admitting: *Deleted

## 2024-04-12 ENCOUNTER — Telehealth: Payer: Self-pay | Admitting: Radiation Oncology

## 2024-04-12 DIAGNOSIS — C50919 Malignant neoplasm of unspecified site of unspecified female breast: Secondary | ICD-10-CM

## 2024-04-12 DIAGNOSIS — C801 Malignant (primary) neoplasm, unspecified: Secondary | ICD-10-CM

## 2024-04-12 DIAGNOSIS — K769 Liver disease, unspecified: Secondary | ICD-10-CM

## 2024-04-12 NOTE — Telephone Encounter (Signed)
 Received inbasket from Devere Gladstone Jules stating per Dr. Cloretta Radiation consult not needed at this time, closing referral until further notice.

## 2024-04-12 NOTE — Progress Notes (Signed)
 PATIENT NAVIGATOR PROGRESS NOTE  Name: Elaine Simon Date: 04/12/2024 MRN: [id number]  DOB: June 12, 1951   Reason for visit:  New Patient appt  Comments:  Called and spoke with Ms [facility] and discussed a New pt appt with Dr Cloretta.  Scheduled her for Wednesday 7/30 at 1:10 pm with Dr Cloretta. Reviewed directions to building and parking  as well as one support person in appt with her. We discussed Port placement and have her scheduled for port on Thursday 7/31 at [facility].  Per Dr Cloretta please cancel consult with Radiation oncology at this time, message sent to Rad onc scheduling team  Have requested MSI and Foundation one testing on biopsy dated 7/24.  Reviewed what to expect with appt on Wednesday and gave her contact number to call with any questions    Time spent counseling/coordinating care: 45-60 minutes

## 2024-04-13 ENCOUNTER — Ambulatory Visit

## 2024-04-13 ENCOUNTER — Ambulatory Visit: Admitting: Radiation Oncology

## 2024-04-14 ENCOUNTER — Encounter: Payer: Self-pay | Admitting: Oncology

## 2024-04-14 ENCOUNTER — Other Ambulatory Visit: Payer: Self-pay | Admitting: Radiology

## 2024-04-14 ENCOUNTER — Other Ambulatory Visit: Payer: Self-pay | Admitting: *Deleted

## 2024-04-14 ENCOUNTER — Inpatient Hospital Stay (HOSPITAL_BASED_OUTPATIENT_CLINIC_OR_DEPARTMENT_OTHER): Admitting: Oncology

## 2024-04-14 ENCOUNTER — Encounter: Payer: Self-pay | Admitting: *Deleted

## 2024-04-14 VITALS — BP 106/72 | HR 90 | Temp 97.8°F | Resp 18 | Ht 62.0 in | Wt 173.3 lb

## 2024-04-14 DIAGNOSIS — C801 Malignant (primary) neoplasm, unspecified: Secondary | ICD-10-CM

## 2024-04-14 DIAGNOSIS — C170 Malignant neoplasm of duodenum: Secondary | ICD-10-CM | POA: Diagnosis not present

## 2024-04-14 DIAGNOSIS — K769 Liver disease, unspecified: Secondary | ICD-10-CM

## 2024-04-14 LAB — SURGICAL PATHOLOGY

## 2024-04-14 NOTE — Progress Notes (Signed)
 PATIENT NAVIGATOR PROGRESS NOTE  Name: Elaine Simon Date: 04/14/2024 MRN: [id number]  DOB: 05-06-1951   Reason for visit:  New pt appt  Comments:  Met with [name] and [name] during appt with Dr Cloretta Referral placed to SW, for counseling, food insecurity and financial burdens Referral placed to nutrition due to decreased appetite and avoiding bowel obstruction  PET scan scheduled for Tuesday 8/5 Will be presented at GI conference 8/6 Port to be placed 7/31 Requesting MMR and molecular testing on accession number [id number] Given Journeys notebook with disease specific information and contact number to call with any questions    Time spent counseling/coordinating care: > 60 minutes

## 2024-04-14 NOTE — Progress Notes (Unsigned)
 Urgent referral placed for SW And written instructions given for port placement for 04/15/24

## 2024-04-14 NOTE — H&P (Signed)
 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 female with pmhx significant for anxiety, depression, hyperlipidemia, diverticulosis, hiatal hernia, uterine fibroids, hypertension, hypothyroidism, osteoarthritis, panic attacks, remote left breast cancer.  Had presented to the ED back on 04/04/2024 with CT imaging showing hepatic and duodenal lesions.  Had duodenal biopsy with Dr. Rollin of GI showing metastatic process.  Also had IR image guided liver biopsy with inconclusive pathology results.  Now presenting back to IR service for portacatheter placement for chemotherapy with Dr. Odean of oncology.  *** Patient is Full Code  Past Medical History:  Diagnosis Date   Anxiety    Breast cancer (HCC) 1995   Cataract immature    bilat.   Depression    Hay fever    Hx of mitral valve prolapse    has had no problems   Hyperlipidemia    Hypertension    Hypothyroidism    Insomnia    Osteoarthritis    Panic attacks    Personal history of chemotherapy    Personal history of radiation therapy 1995   Snores    states occ. apnea, occ. wakes up coughing; has never had sleep study;  denies daytime sleepiness    Past Surgical History:  Procedure Laterality Date   BREAST BIOPSY Right 06/2023   BREAST CYST ASPIRATION Left 02/18/2023   abscess   BREAST LUMPECTOMY Left 1995   left   BREAST RECONSTRUCTION  04/01/2012   Procedure: BREAST RECONSTRUCTION;  Surgeon: Estefana Reichert, DO;  Location: [facility];  Service: Plastics;  Laterality: Right;  Right breast mastopexy reduction    GANGLION CYST EXCISION     IR US  LIVER BIOPSY  04/08/2024   LYMPH NODE DISSECTION     REDUCTION MAMMAPLASTY Bilateral    TONSILLECTOMY     UTERINE FIBROID SURGERY       Allergies: Adhesive [tape] and Codeine  Medications: Prior to Admission medications   Medication Sig Start Date End Date Taking? Authorizing Provider  ALPRAZolam  (XANAX ) 0.5 MG tablet Take 2 tablets (1 mg total) by mouth 2 (two) times daily as needed. 04/05/24   Odean Potts, MD  ASPIRIN  LOW DOSE 81 MG EC tablet TAKE 1 TABLET(81 MG) BY MOUTH DAILY. SWALLOW WHOLE 02/27/21   Patwardhan, Newman PARAS, MD  Cholecalciferol (VITAMIN D ) 10 MCG/ML LIQD Vitamin D     [provider]  diclofenac  sodium (VOLTAREN ) 1 % GEL Apply 2 g topically 4 (four) times daily. 08/10/17   Babara, Amy V, PA-C  diltiazem  (CARDIZEM  CD) 120 MG 24 hr capsule Take 1 capsule (120 mg total) by mouth daily. 10/01/23   Tobb, Kardie, DO  docusate sodium (COLACE) 100 MG capsule Take 100 mg by mouth daily as needed for mild constipation.    [provider]  fluticasone (FLONASE) 50 MCG/ACT nasal spray Place 2 sprays into both nostrils daily as needed. 06/23/19   [provider]  gabapentin  (NEURONTIN ) 300 MG capsule Take 300 mg by mouth 3 (three) times daily.    [provider]  HYDROcodone -acetaminophen  (NORCO/VICODIN) 5-325 MG tablet Take 1 tablet by mouth every 6 (six) hours as needed for moderate pain (pain score 4-6). 04/04/24   Sofia, Leslie K, PA-C  HYDROcodone -acetaminophen  (NORCO/VICODIN) 5-325 MG tablet Take 1 tablet by mouth every 6 (six) hours as needed. 04/05/24  Gudena, Vinay, MD  meloxicam  (MOBIC ) 15 MG tablet Take 1 tablet by mouth daily. 05/25/20   [provider]  phentermine (ADIPEX-P) 37.5 MG tablet Take 1 tablet by mouth daily. 06/10/19   [provider]  rosuvastatin (CRESTOR) 40 MG tablet Take 40 mg by mouth daily. 08/31/20   [provider]  rosuvastatin (CRESTOR) 5 MG tablet 5 mg.    [provider]  traZODone (DESYREL) 150 MG tablet Take 150 mg by mouth at bedtime as needed. 08/19/20   [provider]  Vitamin D , Ergocalciferol , (DRISDOL)  1.25 MG (50000 UNIT) CAPS capsule Take 1 capsule by mouth once a week. 09/27/20   [provider]     Family History  Problem Relation Age of Onset   Heart disease Other    Diabetes Other    Breast cancer Other    Hypertension Mother    Cancer Father 66       prostate   Heart disease Father    Heart attack Father    Cancer Brother 84       prostate   Cancer Maternal Aunt 60       kidney   Cancer Maternal Grandfather 50       stomach   Hypertension Brother     Social History   Socioeconomic History   Marital status: Single    Spouse name: Not on file   Number of children: 0   Years of education: Not on file   Highest education level: Not on file  Occupational History   Not on file  Tobacco Use   Smoking status: Never    Passive exposure: Past   Smokeless tobacco: Never  Vaping Use   Vaping status: Never Used  Substance and Sexual Activity   Alcohol use: Yes    Comment: occasionally   Drug use: No   Sexual activity: Not on file  Other Topics Concern   Not on file  Social History Narrative   Not on file   Social Drivers of Health   Financial Resource Strain: High Risk (04/14/2024)   Overall Financial Resource Strain (CARDIA)    Difficulty of Paying Living Expenses: Very hard  Food Insecurity: Food Insecurity Present (04/14/2024)   Hunger Vital Sign    Worried About Running Out of Food in the Last Year: Often true    Ran Out of Food in the Last Year: Often true  Transportation Needs: No Transportation Needs (04/14/2024)   PRAPARE - Administrator, Civil Service (Medical): No    Lack of Transportation (Non-Medical): No  Physical Activity: Inactive (04/14/2024)   Exercise Vital Sign    Days of Exercise per Week: 0 days    Minutes of Exercise per Session: 0 min  Stress: Stress Concern Present (04/14/2024)   [employer] of Occupational Health - Occupational Stress Questionnaire    Feeling of Stress: To some extent  Social Connections:  Socially Isolated (04/14/2024)   Social Connection and Isolation Panel    Frequency of Communication with Friends and Family: Three times a week    Frequency of Social Gatherings with Friends and Family: Three times a week    Attends Religious Services: Never    Active Member of Clubs or Organizations: No    Attends Banker Meetings: Never    Marital Status: Divorced     Review of Systems: A 12 point ROS discussed and pertinent positives are indicated in the HPI above.  All other systems are  negative.  Review of Systems  Vital Signs: There were no vitals taken for this visit.  Advance Care Plan: The advanced care place/surrogate decision maker was discussed at the time of visit and the patient did not wish to discuss or was not able to name a surrogate decision maker or provide an advance care plan.  Physical Exam  Imaging: IR US  LIVER BIOPSY Result Date: 04/08/2024 INDICATION: 73 year old female with a history breast cancer now with a duodenal and liver mass concerning for metastatic disease versus new duodenal primary. She presents for ultrasound-guided core biopsy. EXAM: LIVER CORE BIOPSY MEDICATIONS: None. ANESTHESIA/SEDATION: Moderate (conscious) sedation was employed during this procedure. A total of Versed  2 mg and Fentanyl  100 mcg was administered intravenously. Moderate Sedation Time: 13 minutes. The patient's level of consciousness and vital signs were monitored continuously by radiology nursing throughout the procedure under my direct supervision. FLUOROSCOPY TIME:  None. COMPLICATIONS: None immediate. PROCEDURE: Informed written consent was obtained from the patient after a thorough discussion of the procedural risks, benefits and alternatives. All questions were addressed. Maximal Sterile Barrier Technique was utilized including caps, mask, sterile gowns, sterile gloves, sterile drape, hand hygiene and skin antiseptic. A timeout was performed prior to the initiation  of the procedure. The right upper quadrant was interrogated with ultrasound. There is a large mildly hyperechoic liver mass in the posterior aspect of the right hepatic lobe. A suitable skin entry site was selected and marked. The region was sterilely prepped and draped in the standard fashion using chlorhexidine skin prep. Local anesthesia was attained by infiltration with 1% lidocaine . A small dermatotomy was made. Under real-time sonographic guidance, a 17 gauge introducer needle was advanced into the margin of the mass. Multiple 18 gauge biopsies were then coaxially obtained using the BioPince automated biopsy device. Biopsy samples were placed in formalin and delivered to pathology for further analysis. As a 17 gauge introducer needle was removed, the biopsy tract was embolized with a Gel-Foam slurry. Post biopsy ultrasound imaging demonstrates no evidence of active hemorrhage or perihepatic hematoma. The patient tolerated the procedure well. IMPRESSION: Technically successful ultrasound-guided core biopsy of hepatic lesion. Electronically Signed   By: Wilkie Lent M.D.   On: 04/08/2024 14:23   CT ABDOMEN PELVIS W CONTRAST Result Date: 04/04/2024 CLINICAL DATA:  Abdominal pain. Evaluate mass seen on ultrasound. * Tracking Code: BO * EXAM: CT ABDOMEN AND PELVIS WITH CONTRAST TECHNIQUE: Multidetector CT imaging of the abdomen and pelvis was performed using the standard protocol following bolus administration of intravenous contrast. RADIATION DOSE REDUCTION: This exam was performed according to the departmental dose-optimization program which includes automated exposure control, adjustment of the mA and/or kV according to patient size and/or use of iterative reconstruction technique. CONTRAST:  OMNIPAQUE  IOHEXOL  300 MG/ML  SOLN COMPARISON:  Right upper quadrant sonogram dated 04/04/24 FINDINGS: Lower chest: No acute abnormality. Hepatobiliary: Within the posterior dome of right lobe there is a  heterogeneous low-density partially exophytic mass measuring 5.7 x 6.2 by 4.5 cm, image 13/2. Gallbladder is normal. No bile duct dilatation. Pancreas: No main duct dilatation or inflammation. No definite mass within the head of pancreas. Spleen: Normal in size without focal abnormality. Adrenals/Urinary Tract: Normal adrenal glands. Cyst in the left kidney measures 3.1 cm, image 31/2. No follow-up imaging recommended. No suspicious mass or obstructive uropathy. Urinary bladder is normal. Stomach/Bowel: Small to moderate hiatal hernia. Along the course of the descending duodenum there is a heterogeneous low-attenuation mass which measures 3.4 by 3.4 by  6.9 cm. There appears to be extraluminal extension of this mass along the anterior and medial wall of the duodenum at the junction between the descending and horizontal portions of the duodenum, axial image 33. No pathologic dilatation of the large or small bowel loops. The appendix is visualized and appears normal. Colonic diverticulosis without signs of acute diverticulitis. Vascular/Lymphatic: Aortic atherosclerosis. No aneurysm. No signs of abdominopelvic adenopathy. Reproductive: Multiple calcified uterine fibroids. These measure up to 4.2 cm. No adnexal mass. Other: No ascites or focal fluid collections. Musculoskeletal: No acute or significant osseous findings. IMPRESSION: 1. There is a heterogeneous low-density partially exophytic mass within the posterior dome of the right lobe of the liver measuring 5.7 x 6.2 by 4.5 cm. This is concerning for hepatic metastatic disease. 2. There is a heterogeneous low-attenuation mass along the course of the descending duodenum which measures 3.4 x 3.4 by 6.9 cm. There appears to be extraluminal extension of this mass along the anterior and medial wall of the duodenum at the junction between the descending and horizontal portions of the duodenum. 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 3. Colonic diverticulosis  without signs of acute diverticulitis. 4. Small to moderate hiatal hernia. 5. Multiple calcified uterine fibroids. 6.  Aortic Atherosclerosis (ICD10-I70.0). Electronically Signed   By: Waddell Calk M.D.   On: 04/04/2024 13:57   US  Abdomen Limited RUQ (LIVER/GB) Result Date: 04/04/2024 CLINICAL DATA:  Right upper quadrant pain.  History of breast cancer EXAM: ULTRASOUND ABDOMEN LIMITED RIGHT UPPER QUADRANT COMPARISON:  None Available. FINDINGS: Gallbladder: Dilated gallbladder. No shadowing stones. No wall thickening or adjacent fluid. Common bile duct: Diameter: 9 mm.  Borderline enlarged for the patient's age. Liver: Heterogeneous masslike area along the posterior aspect of the right hepatic lobe measuring 6.4 cm. Is also a mass lesion near the head of the pancreas. This could be pancreatic versus an abnormal lymph node measuring 4.3 cm. Portal vein is patent on color Doppler imaging with normal direction of blood flow towards the liver. Other: None. IMPRESSION: Distended gallbladder. No shadowing stones. Common duct at 9 mm, upper limits of normal for patient's age. However there is a possible mass lesion in the posterior right hepatic lobe as well as lesion centrally towards the pancreatic head. Pancreatic lesion versus an abnormal lymph node is possible. Recommend further evaluation with contrast CT scan of the abdomen pelvis when clinically able. Electronically Signed   By: Ranell Bring M.D.   On: 04/04/2024 11:17    Labs:  CBC: Recent Labs    04/04/24 0932 04/08/24 1154  WBC 5.6 5.9  HGB 10.7* 10.9*  HCT 34.1* 35.8*  PLT 343 407*    COAGS: Recent Labs    04/08/24 1154  INR 1.0    BMP: Recent Labs    10/01/23 1023 04/04/24 0932 04/08/24 1154  NA 143 140 141  K 4.7 3.7 3.9  CL 105 108 110  CO2 25 21* 21*  GLUCOSE 97 125* 115*  BUN 16 20 12   CALCIUM 10.0 9.6 9.7  CREATININE 0.93 1.08* 0.89  GFRNONAA  --  55* >60    LIVER FUNCTION TESTS: Recent Labs    10/01/23 1023  04/04/24 0932 04/08/24 1154  BILITOT <0.2 0.4 0.5  AST 19 29 19   ALT 21 30 23   ALKPHOS 89 95 98  PROT 6.9 7.1 7.3  ALBUMIN 4.5 3.6 3.6    TUMOR MARKERS: No results for input(s): AFPTM, CEA, CA199, CHROMGRNA in the last 8760 hours.  Assessment  and Plan:  GALIA RAHM is a 73 y.o. female with pmhx significant for anxiety, depression, hyperlipidemia, diverticulosis, hiatal hernia, uterine fibroids, hypertension, hypothyroidism, osteoarthritis, panic attacks, remote left breast cancer.  Had presented to the ED back on 04/04/2024 with CT imaging showing hepatic and duodenal lesions.  Had duodenal biopsy with Dr. Rollin of GI showing metastatic process.  Also had IR image guided liver biopsy with inconclusive pathology results.  Now presenting back to IR service for portacatheter placement for chemotherapy with Dr. Odean of oncology.  Risks and benefits of image-guided Port-a-catheter placement were discussed with the patient including, but not limited to bleeding, infection, pneumothorax, or fibrin sheath development and need for additional procedures. All of the patient's questions were answered, patient is agreeable to proceed. Consent signed and in chart.   Thank you for allowing our service to participate in SARIT SPARANO 's care.  Electronically Signed: Kimble VEAR Clas, PA-C   04/14/2024, 2:34 PM      I spent a total of  30 Minutes   in face to face in clinical consultation, greater than 50% of which was counseling/coordinating care for portacatheter placement

## 2024-04-14 NOTE — Progress Notes (Signed)
 [facility] New Patient Consult   Requesting MD: Shelda Atlas, Md [address] [place],  [place] [address]   Elaine Simon 73 y.o.  [birth year]    Reason for Consult: Duodenal carcinoma   HPI: Elaine Simon is followed by Dr. Odean with a remote history of breast cancer.  She presented to the emergency room 04/04/2024 with abdominal pain nausea/vomiting for several weeks. A CT of the abdomen and pelvis revealed a mass in the posterior dome of the right liver 5.7 x 6.2 x 4.5 cm.  A mass was noted in the descending duodenum measuring 3.4 x 3.4 x 0.9 cm.  There was evidence of extraluminal extension of the mass.  No abdominal pelvic adenopathy.  She saw Dr. Gudena on 04/05/2024.  She was prescribed hydrocodone  for pain.  She was referred to Dr. Iverson for an upper endoscopy and to interventional radiology for a biopsy of the liver lesion.  She reports no further nausea. She was taken an upper endoscopy on 04/06/2024.  A large mass was noted in the second portion of the duodenum.  There was ulceration.  Multiple core biopsies were obtained.  The mass completely obstructed the duodenal lumen.  The pathology revealed moderately differentiated adenocarcinoma with mucinous features.  Underwent ultrasound-guided biopsy of the liver lesion on 04/08/2024.  The pathology revealed acute inflammation with a prominent atypical lymphoid aggregate.  There is a background of acellular mucin.  Inflammation suggest a sending cholangitis.   Past Medical History:  Diagnosis Date   Anxiety    Breast cancer (HCC), stage I, adjuvant chemotherapy, radiation, and [place] 1995   Cataract immature    bilat.   Depression    Hay fever    Hx of mitral valve prolapse    has had no problems   Hyperlipidemia    Hypertension    Hypothyroidism    Insomnia    Osteoarthritis    Panic attacks    Personal history of chemotherapy    Personal history of radiation therapy 1995   Snores     states occ. apnea, occ. wakes up coughing; has never had sleep study;  denies daytime sleepiness    .  G0, P0  Past Surgical History:  Procedure Laterality Date   BREAST BIOPSY Right 06/2023   BREAST CYST ASPIRATION Left 02/18/2023   abscess   BREAST LUMPECTOMY Left 1995   left   BREAST RECONSTRUCTION  04/01/2012   Procedure: BREAST RECONSTRUCTION;  Surgeon: Estefana Reichert, DO;  Location: [facility];  Service: Plastics;  Laterality: Right;  Right breast mastopexy reduction    GANGLION CYST EXCISION     IR US  LIVER BIOPSY  04/08/2024   LYMPH NODE DISSECTION     REDUCTION MAMMAPLASTY Bilateral    TONSILLECTOMY     UTERINE FIBROID SURGERY      Medications: Reviewed  Allergies:  Allergies  Allergen Reactions   Adhesive [Tape] Other (See Comments)    SKIN IRRITATION   Codeine Itching    AND BURNING SENSATION ALL OVER    Family history: Her paternal grandfather, father, and brother had prostate cancer  Social History:   She lives with her brother in [place].  She is retired from a Software engineer job.  She does not use cigarettes.  Rare alcohol use.  No transfusion history.  No risk factor for HIV or hepatitis.  ROS:   Positives include: Episode of abdominal pain earlier today, constipation for 1 month, anorexia, 5-6 pound weight loss over  the past 2 months, intermittent numbness of the left foot, nausea/vomiting prior to the emergency room visit 04/04/2024-resolved  A complete ROS was otherwise negative.  Physical Exam:  Blood pressure 106/72, pulse 90, temperature 97.8 F (36.6 C), temperature source Temporal, resp. rate 18, height 5' 2 (1.575 m), weight 173 lb 4.8 oz (78.6 kg), SpO2 100%.  HEENT: Oropharynx without visible mass, neck without mass Lungs: Clear bilaterally Cardiac: Regular rate and rhythm Abdomen: No hepatosplenomegaly, no mass, nontender Breast: Left lumpectomy, firm tissue surrounding the left areola scar, no discrete mass Vascular:  No leg edema Lymph nodes: No cervical, supraclavicular, axillary, or inguinal nodes Neurologic: Alert and oriented, the motor exam appears intact in the upper and lower extremities bilaterally Skin: No rash Musculoskeletal: No spine tenderness   LAB:  CBC  Lab Results  Component Value Date   WBC 5.9 04/08/2024   HGB 10.9 (L) 04/08/2024   HCT 35.8 (L) 04/08/2024   MCV 89.5 04/08/2024   PLT 407 (H) 04/08/2024   NEUTROABS 3.1 04/08/2024        CMP  Lab Results  Component Value Date   NA 141 04/08/2024   K 3.9 04/08/2024   CL 110 04/08/2024   CO2 21 (L) 04/08/2024   GLUCOSE 115 (H) 04/08/2024   BUN 12 04/08/2024   CREATININE 0.89 04/08/2024   CALCIUM 9.7 04/08/2024   PROT 7.3 04/08/2024   ALBUMIN 3.6 04/08/2024   AST 19 04/08/2024   ALT 23 04/08/2024   ALKPHOS 98 04/08/2024   BILITOT 0.5 04/08/2024   GFRNONAA >60 04/08/2024   GFRAA 75 09/26/2020      Imaging:  As per HPI, CT abdomen/pelvis images from 04/04/2024 reviewed with Ms. Lanzo    Assessment/Plan:   Duodenal carcinoma 04/04/2024-CT abdomen/pelvis: Heterogenous partially exophytic mass in the posterior dome of the right liver, heterogenous low-attenuation mass in the descending duodenum with extraluminal extension 04/06/2024 upper endoscopy: Large mass in the second portion of the duodenum, completely obstructing, ulcerated.  Biopsy-moderately differentiated adenocarcinoma with mucinous features 04/08/2024: Ultrasound biopsy of liver mass: Acute formation/possible ascending cholangitis with prominent appearing lymphoid aggregate, background pools of acellular mucin Stage I left breast cancer 1995, status post lumpectomy and left axillary dissection, adjuvant chemotherapy, radiation, and a visa Hypertension Hypothyroidism Osteoarthritis Normocytic anemia July 2025 Anorexia/weight loss secondary to #1 Anxiety/depression Family history of prostate cancer   Disposition:   Elaine Simon  presented with abdominal pain and nausea/vomiting.  She was found to have a duodenal mass and a single large liver lesion.  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  The liver biopsy was nondiagnostic, but I suspect the pools of mucin within the biopsy specimen are reflective of underlying metastatic carcinoma.  I discussed the diagnosis and treatment options with Ms. Salberg.  She will be referred for a staging PET scan.  We will obtain mismatch repair protein testing and NGS testing on the duodenal biopsy tissue.  I will present her case at the GI tumor conference next week.  She may be a candidate for resection of the primary tumor and liver lesion if the PET reveals no evidence of distant metastatic disease.  She is scheduled for Port-A-Cath placement tomorrow.  The plan is to begin systemic therapy within the next few weeks.  We will most likely recommend FOLFOX, to potentially be followed by surgery.    Arley Hof, MD  04/14/2024, 3:15 PM

## 2024-04-15 ENCOUNTER — Ambulatory Visit (HOSPITAL_COMMUNITY)
Admission: RE | Admit: 2024-04-15 | Discharge: 2024-04-15 | Disposition: A | Discharge status: Home / Self Care | Source: Ambulatory Visit | Attending: Hematology and Oncology | Admitting: Hematology and Oncology

## 2024-04-15 ENCOUNTER — Telehealth: Payer: Self-pay

## 2024-04-15 ENCOUNTER — Telehealth: Admitting: Hematology and Oncology

## 2024-04-15 ENCOUNTER — Encounter (HOSPITAL_COMMUNITY): Payer: Self-pay

## 2024-04-15 ENCOUNTER — Encounter: Payer: Self-pay | Admitting: General Practice

## 2024-04-15 ENCOUNTER — Other Ambulatory Visit: Payer: Self-pay

## 2024-04-15 DIAGNOSIS — Z5986 Financial insecurity: Secondary | ICD-10-CM | POA: Diagnosis not present

## 2024-04-15 DIAGNOSIS — C801 Malignant (primary) neoplasm, unspecified: Secondary | ICD-10-CM | POA: Insufficient documentation

## 2024-04-15 DIAGNOSIS — Z170 Estrogen receptor positive status [ER+]: Secondary | ICD-10-CM | POA: Insufficient documentation

## 2024-04-15 DIAGNOSIS — F419 Anxiety disorder, unspecified: Secondary | ICD-10-CM | POA: Diagnosis not present

## 2024-04-15 DIAGNOSIS — E785 Hyperlipidemia, unspecified: Secondary | ICD-10-CM | POA: Diagnosis not present

## 2024-04-15 DIAGNOSIS — Z5941 Food insecurity: Secondary | ICD-10-CM | POA: Insufficient documentation

## 2024-04-15 DIAGNOSIS — K769 Liver disease, unspecified: Secondary | ICD-10-CM | POA: Diagnosis not present

## 2024-04-15 DIAGNOSIS — Z7722 Contact with and (suspected) exposure to environmental tobacco smoke (acute) (chronic): Secondary | ICD-10-CM | POA: Diagnosis not present

## 2024-04-15 DIAGNOSIS — M1990 Unspecified osteoarthritis, unspecified site: Secondary | ICD-10-CM | POA: Diagnosis not present

## 2024-04-15 DIAGNOSIS — I10 Essential (primary) hypertension: Secondary | ICD-10-CM | POA: Insufficient documentation

## 2024-04-15 DIAGNOSIS — F32A Depression, unspecified: Secondary | ICD-10-CM | POA: Insufficient documentation

## 2024-04-15 DIAGNOSIS — C50912 Malignant neoplasm of unspecified site of left female breast: Secondary | ICD-10-CM | POA: Insufficient documentation

## 2024-04-15 DIAGNOSIS — Z79899 Other long term (current) drug therapy: Secondary | ICD-10-CM | POA: Diagnosis not present

## 2024-04-15 DIAGNOSIS — C50919 Malignant neoplasm of unspecified site of unspecified female breast: Secondary | ICD-10-CM

## 2024-04-15 HISTORY — PX: IR IMAGING GUIDED PORT INSERTION: IMG5740

## 2024-04-15 MED ORDER — MIDAZOLAM HCL 2 MG/2ML IJ SOLN
INTRAMUSCULAR | Status: AC
  Filled 2024-04-15: qty 2

## 2024-04-15 MED ORDER — HEPARIN SOD (PORK) LOCK FLUSH 100 UNIT/ML IV SOLN
500.0000 [IU] | Freq: Once | INTRAVENOUS | Status: AC
  Administered 2024-04-15: 500 [IU] via INTRAVENOUS

## 2024-04-15 MED ORDER — LIDOCAINE HCL 1 % IJ SOLN
20.0000 mL | Freq: Once | INTRAMUSCULAR | Status: AC
  Administered 2024-04-15: 17 mL via INTRADERMAL

## 2024-04-15 MED ORDER — FENTANYL CITRATE (PF) 100 MCG/2ML IJ SOLN
INTRAMUSCULAR | Status: AC
  Filled 2024-04-15: qty 2

## 2024-04-15 MED ORDER — MIDAZOLAM HCL 2 MG/2ML IJ SOLN
INTRAMUSCULAR | Status: AC
Start: 2024-04-15 — End: 2024-04-15
  Filled 2024-04-15: qty 2

## 2024-04-15 MED ORDER — LIDOCAINE HCL 1 % IJ SOLN
INTRAMUSCULAR | Status: AC
  Filled 2024-04-15: qty 20

## 2024-04-15 MED ORDER — SODIUM CHLORIDE 0.9 % IV SOLN: INTRAVENOUS | Status: DC

## 2024-04-15 MED ORDER — MIDAZOLAM HCL 2 MG/2ML IJ SOLN
INTRAMUSCULAR | Status: AC | PRN
  Administered 2024-04-15 (×4): 1 mg via INTRAVENOUS

## 2024-04-15 MED ORDER — FENTANYL CITRATE (PF) 100 MCG/2ML IJ SOLN
INTRAMUSCULAR | Status: AC | PRN
  Administered 2024-04-15 (×2): 50 ug via INTRAVENOUS

## 2024-04-15 MED ORDER — HEPARIN SOD (PORK) LOCK FLUSH 100 UNIT/ML IV SOLN
INTRAVENOUS | Status: AC
  Filled 2024-04-15: qty 5

## 2024-04-15 NOTE — Telephone Encounter (Signed)
 [facility] Clinical Social Work  Clinical Social Work was referred by Statistician for assessment of psychosocial needs (New Patient Assessment). Clinical Social Worker attempted to contact patient by phone to offer support and assess for needs. No Answer, likely due to upcoming  [facility]. CSW left a voicemail for patient stating purpose of call and to inform her of scheduled appt to see CSW following 8/01 dietician appointment. CSW encouraged patient to call back if scheduled appointment does not work.     Follow Up Plan:  For now, CSW is scheduled to see patient on 8/01.    Lizbeth Sprague, LCSW  Clinical Social Worker [facility]

## 2024-04-15 NOTE — Discharge Instructions (Signed)
 Implanted Port Insertion, Care After  The following information offers guidance on how to care for yourself after your procedure. Your health care provider may also give you more specific instructions. If you have problems or questions, contact your health care provider.  What can I expect after the procedure? After the procedure, it is common to have: Discomfort at the port insertion site. Bruising on the skin over the port. This should improve over 3-4 days.   Urgent needs - Interventional Radiology, clinic [phone] (mon-fri 8-5).   Wound - May remove dressing and shower in 24 to 48 hours.  Keep site clean and dry.  Replace with bandaid as needed.  Do not submerge in tub or water until site healing well. If closed with glue, glue will flake off on its own.   If ordered by your provider, may start Emla cream (or any other creams ointments or lotions) in 2 weeks or after incision is healed. Port is ready for use immediately.   After completion of treatment, your provider should have you set up for monthly port flushes.   Follow these instructions at home: [facility] care After your port is placed, you will get a manufacturer's information card. The card has information about your port. Keep this card with you at all times. Take care of the port as told by your health care provider. Ask your health care provider if you or a family member can get training for taking care of the port at home. A home health care nurse will be be available to help care for the port. Make sure to remember what type of port you have. Incision care     Follow instructions from your health care provider about how to take care of your port insertion site. Make sure you: Wash your hands with soap and water for at least 20 seconds before and after you change your bandage (dressing). If soap and water are not available, use hand sanitizer. Change your dressing as told by your health care provider. Leave stitches  (sutures), skin glue, or adhesive strips in place. These skin closures may need to stay in place for 2 weeks or longer. If adhesive strip edges start to loosen and curl up, you may trim the loose edges. Do not remove adhesive strips completely unless your health care provider tells you to do that. Check your port insertion site every day for signs of infection. Check for: Redness, swelling, or pain. Fluid or blood. Warmth. Pus or a bad smell. Activity Return to your normal activities as told by your health care provider. Ask your health care provider what activities are safe for you. You may have to avoid lifting. Ask your health care provider how much you can safely lift. General instructions Take over-the-counter and prescription medicines only as told by your health care provider. Do not take baths, swim, or use a hot tub until your health care provider approves. Ask your health care provider if you may take showers. You may only be allowed to take sponge baths. If you were given a sedative during the procedure, it can affect you for several hours. Do not drive or operate machinery until your health care provider says that it is safe. Wear a medical alert bracelet in case of an emergency. This will tell any health care providers that you have a port. Keep all follow-up visits. This is important. Contact a health care provider if: You cannot flush your port with saline as directed, or you cannot  draw blood from the port. You have a fever or chills. You have redness, swelling, or pain around your port insertion site. You have fluid or blood coming from your port insertion site. Your port insertion site feels warm to the touch. You have pus or a bad smell coming from the port insertion site. Get help right away if: You have chest pain or shortness of breath. You have bleeding from your port that you cannot control. These symptoms may be an emergency. Get help right away. Call 911. Do not  wait to see if the symptoms will go away. Do not drive yourself to the hospital. Summary Take care of the port as told by your health care provider. Keep the manufacturer's information card with you at all times. Change your dressing as told by your health care provider. Contact a health care provider if you have a fever or chills or if you have redness, swelling, or pain around your port insertion site. Keep all follow-up visits. This information is not intended to replace advice given to you by your health care provider. Make sure you discuss any questions you have with your health care provider. Document Revised: 03/06/2021 Document Reviewed: 03/06/2021 Elsevier Patient Education  2023 Elsevier Inc.    Moderate Conscious Sedation  Adult  Care After (English)  After the procedure, it is common to have: Sleepiness for a few hours. Impaired judgment for a few hours. Trouble with balance. Nausea or vomiting if you eat too soon. Follow these instructions at home: For the time period you were told by your health care provider:  Rest. Do not participate in activities where you could fall or become injured. Do not drive or use machinery. Do not drink alcohol. Do not take sleeping pills or medicines that cause drowsiness. Do not make important decisions or sign legal documents. Do not take care of children on your own. Eating and drinking Follow instructions from your health care provider about what you may eat and drink. Drink enough fluid to keep your urine pale yellow. If you vomit: Drink clear fluids slowly and in small amounts as you are able. Clear fluids include water, ice chips, low-calorie sports drinks, and fruit juice that has water added to it (diluted fruit juice). Eat light and bland foods in small amounts as you are able. These foods include bananas, applesauce, rice, lean meats, toast, and crackers. General instructions Take over-the-counter and prescription medicines  only as told by your health care provider. Have a responsible adult stay with you for the time you are told. Do not use any products that contain nicotine or tobacco. These products include cigarettes, chewing tobacco, and vaping devices, such as e-cigarettes. If you need help quitting, ask your health care provider. Return to your normal activities as told by your health care provider. Ask your health care provider what activities are safe for you. Your health care provider may give you more instructions. Make sure you know what you can and cannot do. Contact a health care provider if: You are still sleepy or having trouble with balance after 24 hours. You feel light-headed. You vomit every time you eat or drink. You get a rash. You have a fever. You have redness or swelling around the IV site. Get help right away if: You have trouble breathing. You start to feel confused at home. These symptoms may be an emergency. Get help right away. Call 911. Do not wait to see if the symptoms will go away. Do not  drive yourself to the hospital. This information is not intended to replace advice given to you by your health care provider. Make sure you discuss any questions you have with your health care provider.

## 2024-04-15 NOTE — Procedures (Signed)
 Interventional Radiology Procedure Note  Procedure: Single Lumen Power Port Placement    Access:  Right IJ vein.  Findings: Catheter tip positioned at SVC/RA junction. Port is ready for immediate use.   Complications: None  EBL: < 10 mL  Recommendations:  - Ok to shower in 24 hours - Do not submerge for 7 days - Routine line care   Maxi Carreras T. Fredia Sorrow, M.D Pager:  [phone]

## 2024-04-15 NOTE — Progress Notes (Signed)
 [facility] Spiritual Care Note  Left voicemail of care and support, leaving direct number with encouragement to return call as needed. Due to [name]'s change in diagnosis and treatment plan, chaplain suggested follow-up call on Monday.  [address] Olam Corrigan, [place], [facility] Pager [phone] Voicemail [phone]

## 2024-04-16 ENCOUNTER — Inpatient Hospital Stay: Admitting: Dietician

## 2024-04-16 ENCOUNTER — Telehealth: Payer: Self-pay

## 2024-04-16 ENCOUNTER — Inpatient Hospital Stay

## 2024-04-16 NOTE — Telephone Encounter (Signed)
 [facility] CSW Progress Note  Patient called to cancel scheduled appointments with dietician and Child psychotherapist. Patient is still sore from port placement. CSW relayed message to dietician. CSW will follow up with patient via telephone after scans are completed.       Follow Up Plan:  CSW will follow-up with patient by phone     Lizbeth Sprague, LCSW Clinical Social Worker [facility]

## 2024-04-19 ENCOUNTER — Encounter: Payer: Self-pay | Admitting: General Practice

## 2024-04-19 NOTE — Progress Notes (Signed)
 [facility] Spiritual Care Note  Followed up with Ms Smarr, who goes by Diane, by phone for spiritual and emotional support between new diagnosis/port placement and PET scan tomorrow. She reports significant distress and trouble sleeping as she works to process her diagnosis and uncertainty about what to expect from treatment and its duration. Chaplain encouraged her to report to medical team for advice if sleep/stress patterns continue to compromise quality of life.  Provided compassionate presence, reflective listening, normalization of feelings, emotional support, and prayer per request. In the midst of the natural feeling of loss of control through such significant life changes, chaplain encouraged paying attention to smaller, daily details of life that contribute to a sense of meaning (such as simple connections with other people, glimpses of beauty, moments of humor). This is a practice that localizes a sense of control and increases enjoyment/gratitude. Diane noted that chaplain's prayer and conversation were a significant source of release and comfort.  We tentatively plan to follow up by phone on Friday 8/8 at 10:00 am, depending on treatment schedule (as yet TBD). Diane has direct Spiritual Care number and knows to reach out whenever needed/desired, as well.    04/19/24 1100  Spiritual Encounters  Type of Visit Follow up  Care provided to: Patient  Referral source Chaplain assessment  Reason for visit Routine spiritual support  Spiritual Framework  Presenting Themes Significant life change;Courage hope and growth  Patient Stress Factors Health changes;Loss of control;Exhausted  Goals  Self/Personal Goals look for the little things (beauty, connection, joy, humor) that make meaning in a day  Clinical Care Goals provide spiritual/prayer support through uncertainty and change  Interventions  Spiritual Care Interventions Made Compassionate presence;Reflective listening;Normalization  of emotions;Explored values/beliefs/practices/strengths;Meaning making;Prayer   Chaplain Olam Filiberto Lemming, [facility] Pager [phone] Voicemail [phone]

## 2024-04-20 ENCOUNTER — Encounter (HOSPITAL_COMMUNITY)
Admission: RE | Admit: 2024-04-20 | Discharge: 2024-04-20 | Disposition: A | Discharge status: Home / Self Care | Source: Ambulatory Visit | Attending: Oncology | Admitting: Oncology

## 2024-04-20 DIAGNOSIS — N760 Acute vaginitis: Secondary | ICD-10-CM | POA: Insufficient documentation

## 2024-04-20 DIAGNOSIS — C801 Malignant (primary) neoplasm, unspecified: Secondary | ICD-10-CM | POA: Diagnosis present

## 2024-04-20 LAB — GLUCOSE, CAPILLARY: Glucose-Capillary: 112 mg/dL — ABNORMAL HIGH (ref 70–99)

## 2024-04-20 MED ORDER — FLUDEOXYGLUCOSE F - 18 (FDG) INJECTION
8.6000 | Freq: Once | INTRAVENOUS | Status: AC
  Administered 2024-04-20: 8.6 via INTRAVENOUS

## 2024-04-21 ENCOUNTER — Inpatient Hospital Stay: Attending: Nurse Practitioner

## 2024-04-21 ENCOUNTER — Encounter: Payer: Self-pay | Admitting: *Deleted

## 2024-04-21 ENCOUNTER — Telehealth: Payer: Self-pay | Admitting: Nutrition

## 2024-04-21 ENCOUNTER — Encounter: Payer: Self-pay | Admitting: General Practice

## 2024-04-21 ENCOUNTER — Other Ambulatory Visit: Payer: Self-pay

## 2024-04-21 DIAGNOSIS — Z8042 Family history of malignant neoplasm of prostate: Secondary | ICD-10-CM | POA: Insufficient documentation

## 2024-04-21 DIAGNOSIS — R630 Anorexia: Secondary | ICD-10-CM | POA: Insufficient documentation

## 2024-04-21 DIAGNOSIS — M545 Low back pain, unspecified: Secondary | ICD-10-CM | POA: Insufficient documentation

## 2024-04-21 DIAGNOSIS — R918 Other nonspecific abnormal finding of lung field: Secondary | ICD-10-CM | POA: Insufficient documentation

## 2024-04-21 DIAGNOSIS — D709 Neutropenia, unspecified: Secondary | ICD-10-CM | POA: Insufficient documentation

## 2024-04-21 DIAGNOSIS — D649 Anemia, unspecified: Secondary | ICD-10-CM | POA: Insufficient documentation

## 2024-04-21 DIAGNOSIS — I10 Essential (primary) hypertension: Secondary | ICD-10-CM | POA: Insufficient documentation

## 2024-04-21 DIAGNOSIS — K5900 Constipation, unspecified: Secondary | ICD-10-CM | POA: Insufficient documentation

## 2024-04-21 DIAGNOSIS — F32A Depression, unspecified: Secondary | ICD-10-CM | POA: Insufficient documentation

## 2024-04-21 DIAGNOSIS — C170 Malignant neoplasm of duodenum: Secondary | ICD-10-CM | POA: Insufficient documentation

## 2024-04-21 DIAGNOSIS — Z5111 Encounter for antineoplastic chemotherapy: Secondary | ICD-10-CM | POA: Insufficient documentation

## 2024-04-21 DIAGNOSIS — Z853 Personal history of malignant neoplasm of breast: Secondary | ICD-10-CM | POA: Insufficient documentation

## 2024-04-21 DIAGNOSIS — E039 Hypothyroidism, unspecified: Secondary | ICD-10-CM | POA: Insufficient documentation

## 2024-04-21 DIAGNOSIS — Z79631 Long term (current) use of antimetabolite agent: Secondary | ICD-10-CM | POA: Insufficient documentation

## 2024-04-21 DIAGNOSIS — M1990 Unspecified osteoarthritis, unspecified site: Secondary | ICD-10-CM | POA: Insufficient documentation

## 2024-04-21 DIAGNOSIS — R197 Diarrhea, unspecified: Secondary | ICD-10-CM | POA: Insufficient documentation

## 2024-04-21 DIAGNOSIS — F419 Anxiety disorder, unspecified: Secondary | ICD-10-CM | POA: Insufficient documentation

## 2024-04-21 DIAGNOSIS — Z79630 Long term (current) use of alkylating agent: Secondary | ICD-10-CM | POA: Insufficient documentation

## 2024-04-21 DIAGNOSIS — Z79899 Other long term (current) drug therapy: Secondary | ICD-10-CM | POA: Insufficient documentation

## 2024-04-21 NOTE — Progress Notes (Unsigned)
 Have requested Foundation one testing on accession number [id number]

## 2024-04-21 NOTE — Progress Notes (Signed)
 [facility] Clinical Social Work  Initial Assessment   Elaine Simon is a 73 y.o. year old female contacted by phone. Clinical Social Work was referred by nurse for New Patient assessment.[name]   SDOH (Social Determinants of Health) assessments performed: No SDOH Interventions    Flowsheet Row Community Documentation from 03/31/2024 in [facility] Community Documentation from 10/24/2023 in [facility]  SDOH Interventions    Food Insecurity Interventions Community Resources Provided, [employer] Resources Referral  [Referred pt to find help by mailing information] Intervention Not Indicated  Housing Interventions -- Intervention Not Indicated  Transportation Interventions -- Intervention Not Indicated  Utilities Interventions Community Resources Provided, [employer] Resource Referral  [Mailed find help [employer] Resources Provided    SDOH Screenings   Food Insecurity: Food Insecurity Present (04/14/2024)  Housing: Unknown (04/14/2024)  Transportation Needs: No Transportation Needs (04/14/2024)  Utilities: At Risk (04/14/2024)  Alcohol Screen: Low Risk  (04/14/2024)  Depression (PHQ2-9): Medium Risk (04/14/2024)  Financial Resource Strain: High Risk (04/14/2024)  Physical Activity: Inactive (04/14/2024)  Social Connections: Socially Isolated (04/14/2024)  Stress: Stress Concern Present (04/14/2024)  Tobacco Use: Low Risk  (04/15/2024)     Distress Screen completed: No     No data to display            Family/Social Information:  Housing Arrangement: patient lives with her brother. Family members/support persons in your life? Patient is not disclosing her diagnosis to her friend group at this time. Patient's primary support system is her brother. Transportation concerns: No transportation barriers. Patient will be transported by her brother.  Employment: Retired retired about 8 or 9 years ago. Patient worked in [employer] for a [employer] and has a varied career prior to that agency.  Income source: Actor concerns: Yes, current concerns Type of concern: Utilities, Rent/ mortgage, and Food Food access concerns: yes, patient is not on SNAP benefits at this time. Patient does not have a lot of excess money due to fixed income. Religious or spiritual practice: Yes-patient identifies as christian. Patient stated faith is a coping skill.  Advanced directives: No  Services Currently in place:  Insurance, Transportation, Family  Coping/ Adjustment to diagnosis: Patient understands treatment plan and what happens next? yes, patient understands chemotherapy will be part of treatment plan that is why she had a port placed. Patient will be meeting with provider for treatment plan discussion on 8/07 Concerns about diagnosis and/or treatment: Overwhelmed by information, How I will pay for the services I need, How will I care for myself, and Quality of life Patient reported stressors: Adjusting to my illness - Patient sources  identified high levels of stress related to cancer.  Hopes and/or priorities: for treatment to be successful. Current coping skills/ strengths: Ability for insight , Active sense of humor , Average or above average intelligence , Capable of independent living , Communication skills , Financial means , General fund of knowledge , Motivation for treatment/growth , Religious Affiliation , and Supportive family/friends     SUMMARY: Current SDOH Barriers:  Limited social support  Clinical Social Work Clinical Goal(s):  Patient will work with SW to address concerns related to adjustment to diagnosis.   Interventions: Discussed common feeling and emotions when being diagnosed with cancer, and the importance of support during treatment Informed patient of the support team roles and support services at [facility] Provided CSW contact information and encouraged patient to call with any  questions or concerns Sent  Medicaid referral into DSS Added name to list for future GI support groups. Sent message to scheduling for nutrition appointment. Sent SNAP application / [employer] information to nurse for patient to receive.    Follow Up Plan: CSW remains available for support throughout treatment. Patient verbalizes understanding of plan: Yes   Lizbeth Sprague, LCSW Clinical Social Worker [facility]

## 2024-04-21 NOTE — Progress Notes (Signed)
 [facility] Spiritual Care Note  Spoke with Ms [facility] by phone, rescheduling next phone conversation to Monday 04/26/2024 at 10:30am.  Elia Olam Corrigan, MDiv, [facility] Pager [phone] Voicemail [phone]

## 2024-04-21 NOTE — Telephone Encounter (Signed)
 PT is scheduled for Nutrition appt.

## 2024-04-22 ENCOUNTER — Inpatient Hospital Stay

## 2024-04-22 ENCOUNTER — Encounter: Payer: Self-pay | Admitting: *Deleted

## 2024-04-22 ENCOUNTER — Telehealth: Payer: Self-pay

## 2024-04-22 ENCOUNTER — Encounter: Payer: Self-pay | Admitting: Nurse Practitioner

## 2024-04-22 ENCOUNTER — Inpatient Hospital Stay (HOSPITAL_BASED_OUTPATIENT_CLINIC_OR_DEPARTMENT_OTHER): Admitting: Nurse Practitioner

## 2024-04-22 VITALS — BP 117/76 | HR 80 | Temp 98.4°F | Resp 15 | Ht 62.0 in | Wt 170.6 lb

## 2024-04-22 DIAGNOSIS — Z79631 Long term (current) use of antimetabolite agent: Secondary | ICD-10-CM | POA: Diagnosis not present

## 2024-04-22 DIAGNOSIS — C801 Malignant (primary) neoplasm, unspecified: Secondary | ICD-10-CM

## 2024-04-22 DIAGNOSIS — Z8042 Family history of malignant neoplasm of prostate: Secondary | ICD-10-CM | POA: Diagnosis not present

## 2024-04-22 DIAGNOSIS — K5900 Constipation, unspecified: Secondary | ICD-10-CM | POA: Diagnosis not present

## 2024-04-22 DIAGNOSIS — Z79630 Long term (current) use of alkylating agent: Secondary | ICD-10-CM | POA: Diagnosis not present

## 2024-04-22 DIAGNOSIS — F32A Depression, unspecified: Secondary | ICD-10-CM | POA: Diagnosis not present

## 2024-04-22 DIAGNOSIS — C179 Malignant neoplasm of small intestine, unspecified: Secondary | ICD-10-CM | POA: Diagnosis not present

## 2024-04-22 DIAGNOSIS — R630 Anorexia: Secondary | ICD-10-CM | POA: Diagnosis not present

## 2024-04-22 DIAGNOSIS — D709 Neutropenia, unspecified: Secondary | ICD-10-CM | POA: Diagnosis not present

## 2024-04-22 DIAGNOSIS — M1990 Unspecified osteoarthritis, unspecified site: Secondary | ICD-10-CM | POA: Diagnosis not present

## 2024-04-22 DIAGNOSIS — Z5111 Encounter for antineoplastic chemotherapy: Secondary | ICD-10-CM | POA: Diagnosis present

## 2024-04-22 DIAGNOSIS — M545 Low back pain, unspecified: Secondary | ICD-10-CM | POA: Diagnosis not present

## 2024-04-22 DIAGNOSIS — E039 Hypothyroidism, unspecified: Secondary | ICD-10-CM | POA: Diagnosis not present

## 2024-04-22 DIAGNOSIS — I10 Essential (primary) hypertension: Secondary | ICD-10-CM | POA: Diagnosis not present

## 2024-04-22 DIAGNOSIS — D649 Anemia, unspecified: Secondary | ICD-10-CM | POA: Diagnosis not present

## 2024-04-22 DIAGNOSIS — R197 Diarrhea, unspecified: Secondary | ICD-10-CM | POA: Diagnosis not present

## 2024-04-22 DIAGNOSIS — C170 Malignant neoplasm of duodenum: Secondary | ICD-10-CM | POA: Diagnosis present

## 2024-04-22 DIAGNOSIS — R918 Other nonspecific abnormal finding of lung field: Secondary | ICD-10-CM | POA: Diagnosis not present

## 2024-04-22 DIAGNOSIS — Z79899 Other long term (current) drug therapy: Secondary | ICD-10-CM | POA: Diagnosis not present

## 2024-04-22 DIAGNOSIS — F419 Anxiety disorder, unspecified: Secondary | ICD-10-CM | POA: Diagnosis not present

## 2024-04-22 DIAGNOSIS — Z853 Personal history of malignant neoplasm of breast: Secondary | ICD-10-CM | POA: Diagnosis not present

## 2024-04-22 LAB — CEA (ACCESS): CEA (CHCC): 114.61 ng/mL — ABNORMAL HIGH (ref 0.00–5.00)

## 2024-04-22 LAB — CMP (CANCER CENTER ONLY)
ALT: 22 U/L (ref 0–44)
AST: 21 U/L (ref 15–41)
Albumin: 4.5 g/dL (ref 3.5–5.0)
Alkaline Phosphatase: 118 U/L (ref 38–126)
Anion gap: 11 (ref 5–15)
BUN: 9 mg/dL (ref 8–23)
CO2: 23 mmol/L (ref 22–32)
Calcium: 10.3 mg/dL (ref 8.9–10.3)
Chloride: 103 mmol/L (ref 98–111)
Creatinine: 0.91 mg/dL (ref 0.44–1.00)
GFR, Estimated: >60 mL/min (ref >60)
Glucose, Bld: 110 mg/dL — ABNORMAL HIGH (ref 70–99)
Potassium: 4.4 mmol/L (ref 3.5–5.1)
Sodium: 138 mmol/L (ref 135–145)
Total Bilirubin: 0.3 mg/dL (ref 0.0–1.2)
Total Protein: 7.5 g/dL (ref 6.5–8.1)

## 2024-04-22 LAB — CBC WITH DIFFERENTIAL (CANCER CENTER ONLY)
Abs Immature Granulocytes: 0.03 K/uL (ref 0.00–0.07)
Basophils Absolute: 0 K/uL (ref 0.0–0.1)
Basophils Relative: 0 %
Eosinophils Absolute: 0.1 K/uL (ref 0.0–0.5)
Eosinophils Relative: 1 %
HCT: 36.6 % (ref 36.0–46.0)
Hemoglobin: 11.3 g/dL — ABNORMAL LOW (ref 12.0–15.0)
Immature Granulocytes: 0 %
Lymphocytes Relative: 35 %
Lymphs Abs: 2.4 K/uL (ref 0.7–4.0)
MCH: 26.8 pg (ref 26.0–34.0)
MCHC: 30.9 g/dL (ref 30.0–36.0)
MCV: 86.9 fL (ref 80.0–100.0)
Monocytes Absolute: 0.4 K/uL (ref 0.1–1.0)
Monocytes Relative: 6 %
Neutro Abs: 3.9 K/uL (ref 1.7–7.7)
Neutrophils Relative %: 58 %
Platelet Count: 398 K/uL (ref 150–400)
RBC: 4.21 MIL/uL (ref 3.87–5.11)
RDW: 14.5 % (ref 11.5–15.5)
WBC Count: 6.9 K/uL (ref 4.0–10.5)
nRBC: 0 % (ref 0.0–0.2)

## 2024-04-22 NOTE — Progress Notes (Unsigned)
 [facility] OFFICE PROGRESS NOTE   Diagnosis: Duodenal carcinoma  INTERVAL HISTORY:   Elaine Simon returns as scheduled.  Appetite overall is poor.  She feels she tolerates smaller portions better.  She feels bloated if she eats a big meal.  She has been constipated.  No nausea or vomiting.  She has occasional pain at the right low back.  Objective:  Vital signs in last 24 hours:  Blood pressure 117/76, pulse 80, temperature 98.4 F (36.9 C), temperature source Oral, resp. rate 15, height 5' 2 (1.575 m), weight 170 lb 9.6 oz (77.4 kg), SpO2 96%.    HEENT: No thrush or ulcers. Resp: Lungs clear bilaterally. Cardio: Regular rate and rhythm. GI: No hepatosplenomegaly.  No mass.  Nontender. Vascular: No leg edema. Neuro: Alert and oriented. Port-A-Cath without erythema.   Lab Results:  Lab Results  Component Value Date   WBC 5.9 04/08/2024   HGB 10.9 (L) 04/08/2024   HCT 35.8 (L) 04/08/2024   MCV 89.5 04/08/2024   PLT 407 (H) 04/08/2024   NEUTROABS 3.1 04/08/2024    Imaging:  No results found.  Medications: I have reviewed the patient's current medications.  Assessment/Plan: Duodenal carcinoma 04/04/2024-CT abdomen/pelvis: Heterogenous partially exophytic mass in the posterior dome of the right liver, heterogenous low-attenuation mass in the descending duodenum with extraluminal extension 04/06/2024 upper endoscopy: Large mass in the second portion of the duodenum, completely obstructing, ulcerated.  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  Peripheral hypermetabolism in the posterior right  hepatic lesion.  Several tiny bilateral pulmonary nodules measuring 3 to 5 mm, too small to characterize by PET.  Scattered foci of hypermetabolism along the length of the colon, nonspecific. Stage I left breast cancer 1995, status post lumpectomy and left axillary dissection, adjuvant chemotherapy, radiation, and a visa Hypertension Hypothyroidism Osteoarthritis Normocytic anemia July 2025 Anorexia/weight loss secondary to #1 Anxiety/depression Family history of prostate cancer  Disposition: Elaine Simon appears stable.  We reviewed the recent PET scan results/images with her and a family member at today's visit.  They understand the findings indicate cancer involving the duodenal lesion, lymph nodes in the hepatoduodenal ligament and the right liver lesion.  Several small lung nodules were noted but too small to be characterized by PET.  She understands the nodules could represent metastatic disease.  We reviewed Dr. Andriette recommendation for systemic therapy on the FOLFOX regimen.  We discussed potential side effects associated with chemotherapy including bone marrow toxicity, nausea, hair loss, allergic reaction.  We reviewed the various forms neuropathy associated with oxaliplatin.  We reviewed potential side effects associated with 5-fluorouracil including hand-foot syndrome, skin hyperpigmentation, skin rash, increased sensitivity to sun, cardiotoxicity, diarrhea.  She understand the goals of treatment are to improve symptoms and potentially prolong survival.  She would like to proceed as above.  She will attend a chemotherapy education class today.  Baseline labs today including CEA.  She will return for cycle 1 FOLFOX early next week.  We will see her in follow-up prior to cycle 2.  Patient seen with Dr. Cloretta.    Olam Ned ANP/GNP-BC   04/22/2024  11:27 AM Elaine Simon has been diagnosed with duodenal cancer.  The staging evaluation is consistent with metastatic  disease involving the liver and probably the  lungs.  The liver biopsy was nondiagnostic for malignancy, but the mucin noted on the biopsy tissue is suggestive of a metastasis from the mucinous adenocarcinoma.  Her case was presented at the GI tumor conference on 04/21/2024.  The plan is to proceed with systemic therapy.  We are waiting on results of molecular testing.  I recommend first-line treatment with FOLFOX.  We reviewed potential toxicities associated with the FOLFOX regimen including the chance of hematologic toxicity, infection, and bleeding.  She agrees to proceed.  She will attend a chemotherapy teaching class.   A treatment plan was entered today.  She will return for cycle 1 FOLFOX 04/27/2024.  I was present for greater than 50% of today's visit.  I performed Medical Decision Making.   Arvella Hof, MD

## 2024-04-22 NOTE — Progress Notes (Signed)
 PATIENT NAVIGATOR PROGRESS NOTE  Name: Elaine Simon Date: 04/22/2024 MRN: [id number]  DOB: [birth year]   Reason for visit:  Treatment decision and education session  Comments:  Please see education note and distress screening    Time spent counseling/coordinating care: > 60 minutes

## 2024-04-23 ENCOUNTER — Encounter: Payer: Self-pay | Admitting: *Deleted

## 2024-04-23 ENCOUNTER — Encounter: Payer: Self-pay | Admitting: Oncology

## 2024-04-23 DIAGNOSIS — C179 Malignant neoplasm of small intestine, unspecified: Secondary | ICD-10-CM | POA: Insufficient documentation

## 2024-04-23 NOTE — Progress Notes (Signed)
START OFF PATHWAY REGIMEN - Other   [id number] (Leucovorin IV D1 + Fluorouracil IV D1/CIV D1,2 + Oxaliplatin IV D1) q14 Days:   A cycle is every 14 days:     Oxaliplatin      Leucovorin      Fluorouracil      Fluorouracil   **Always confirm dose/schedule in your pharmacy ordering system**  Patient Characteristics: Intent of Therapy: Non-Curative / Palliative Intent, Discussed with Patient

## 2024-04-23 NOTE — Progress Notes (Signed)
 Pharmacist Chemotherapy Monitoring - Initial Assessment    Anticipated start date: 04/27/24   The following has been reviewed per standard work regarding the patient's treatment regimen: The patient's diagnosis, treatment plan and drug doses, and organ/hematologic function Lab orders and baseline tests specific to treatment regimen  The treatment plan start date, drug sequencing, and pre-medications Prior authorization status  Patient's documented medication list, including drug-drug interaction screen and prescriptions for anti-emetics and supportive care specific to the treatment regimen The drug concentrations, fluid compatibility, administration routes, and timing of the medications to be used The patient's access for treatment and lifetime cumulative dose history, if applicable  The patient's medication allergies and previous infusion related reactions, if applicable   Changes made to treatment plan:  N/A  Follow up needed:  N/A   Tashona Calk Kreul, RPH, 04/23/2024  9:06 AM

## 2024-04-23 NOTE — Progress Notes (Signed)
 Received faxed notification from [facility] that order for testing has been received and they are working on getting tissue.

## 2024-04-24 ENCOUNTER — Other Ambulatory Visit: Payer: Self-pay

## 2024-04-26 ENCOUNTER — Other Ambulatory Visit: Payer: Self-pay

## 2024-04-26 ENCOUNTER — Inpatient Hospital Stay: Admitting: Nutrition

## 2024-04-26 ENCOUNTER — Encounter: Payer: Self-pay | Admitting: General Practice

## 2024-04-26 NOTE — Progress Notes (Signed)
 73 year old female diagnosed with Duodenal cancer and will receive cycle 1 FOLFOX tomorrow and every 14 days. She is followed by Dr. Cloretta.  PMH includes Breast cancer, chemoradiation, HTN, Hypothyroidism, osteoarthritis, normocytic anemia, anorexia and weight loss, anxiety, depression, panic attacks.  Labs include Glucose 110 on August 7  Height: 5'2 Weight: 170 pounds 9.6 oz August 7 172 pounds 14.4 oz July 21 BMI: 31.2  Patient lives with her brother and has food insecurity. She has a decreased appetite and tolerates small portions of food better. She has a history of constipation but denies nausea and vomiting. Reports 5-6 pound weight loss. Concerned about side effects of treatment. She loves salads and vegetables and is distressed to learn she cannot eat some of her favorite foods. She has only eaten a boiled egg and a piece of toast today. States she is ready to drink and Ensure.  Nutrition Diagnosis: Food and Nutrition Related Knowledge Deficit related to cancer and associated treatments as evidenced by no prior need for nutrition related information.  Intervention: Educated on low fiber diet to prevent bowel obstruction. Encouraged her to continue small amounts of food frequently throughout the day. Educated on foods high in protein. Discouraged intentional weight loss. Chew foods well. Drink increased water daily. Provided tips on poor appetite and fatigue. Provided nutrition fact sheets, coupons and contact information to be given tomorrow at infusion appointment.  Monitoring, Evaluation, Goals: Tolerate adequate calories and protein to minimize weight loss and bowel obstruction.  Next Visit: To be scheduled as needed.

## 2024-04-26 NOTE — Progress Notes (Signed)
 [facility] Spiritual Care Note  Had 60-min follow-up phone call for spiritual and emotional support. Assisted with Diane's processing her feelings about her first treatment (concerns, unknowns, what to anticipate); she found it helpful to remember that it's only the first treatment once. She found comfort in recalling divine promises through many New Testament stories and affirmations, as well as in sharing a time of prayer together.  We plan to follow up by phone at the end of the week to check in after her first treatment, per her request.   04/26/24 1500  Spiritual Encounters  Type of Visit Follow up  Care provided to: Patient  Referral source Patient request  Reason for visit Routine spiritual support  Spiritual Framework  Presenting Themes Significant life change;Values and beliefs;Meaning/purpose/sources of inspiration;Courage hope and growth  Strengths taking comfort in prayer and Scripture (divine promises)  Needs/Challenges/Barriers concern re unknowns related to treatment  Patient Stress Factors Health changes;Loss of control   Chaplain Olam Corrigan, [place], [facility] Pager [phone] Voicemail [phone]

## 2024-04-27 ENCOUNTER — Ambulatory Visit

## 2024-04-27 ENCOUNTER — Inpatient Hospital Stay

## 2024-04-27 ENCOUNTER — Other Ambulatory Visit: Payer: Self-pay | Admitting: Oncology

## 2024-04-27 ENCOUNTER — Other Ambulatory Visit: Payer: Self-pay | Admitting: *Deleted

## 2024-04-27 VITALS — BP 94/63 | HR 85 | Temp 98.0°F | Resp 16 | Ht 62.0 in | Wt 173.0 lb

## 2024-04-27 DIAGNOSIS — Z5111 Encounter for antineoplastic chemotherapy: Secondary | ICD-10-CM | POA: Diagnosis not present

## 2024-04-27 DIAGNOSIS — C179 Malignant neoplasm of small intestine, unspecified: Secondary | ICD-10-CM

## 2024-04-27 LAB — CMP (CANCER CENTER ONLY)
ALT: 18 U/L (ref 0–44)
AST: 19 U/L (ref 15–41)
Albumin: 4 g/dL (ref 3.5–5.0)
Alkaline Phosphatase: 127 U/L — ABNORMAL HIGH (ref 38–126)
Anion gap: 13 (ref 5–15)
BUN: 14 mg/dL (ref 8–23)
CO2: 22 mmol/L (ref 22–32)
Calcium: 9.6 mg/dL (ref 8.9–10.3)
Chloride: 104 mmol/L (ref 98–111)
Creatinine: 0.9 mg/dL (ref 0.44–1.00)
GFR, Estimated: >60 mL/min (ref >60)
Glucose, Bld: 117 mg/dL — ABNORMAL HIGH (ref 70–99)
Potassium: 3.8 mmol/L (ref 3.5–5.1)
Sodium: 139 mmol/L (ref 135–145)
Total Bilirubin: 0.3 mg/dL (ref 0.0–1.2)
Total Protein: 7.1 g/dL (ref 6.5–8.1)

## 2024-04-27 LAB — CBC WITH DIFFERENTIAL (CANCER CENTER ONLY)
Abs Immature Granulocytes: 0.01 K/uL (ref 0.00–0.07)
Basophils Absolute: 0 K/uL (ref 0.0–0.1)
Basophils Relative: 1 %
Eosinophils Absolute: 0.1 K/uL (ref 0.0–0.5)
Eosinophils Relative: 1 %
HCT: 32.9 % — ABNORMAL LOW (ref 36.0–46.0)
Hemoglobin: 10.4 g/dL — ABNORMAL LOW (ref 12.0–15.0)
Immature Granulocytes: 0 %
Lymphocytes Relative: 33 %
Lymphs Abs: 1.9 K/uL (ref 0.7–4.0)
MCH: 27.4 pg (ref 26.0–34.0)
MCHC: 31.6 g/dL (ref 30.0–36.0)
MCV: 86.8 fL (ref 80.0–100.0)
Monocytes Absolute: 0.3 K/uL (ref 0.1–1.0)
Monocytes Relative: 6 %
Neutro Abs: 3.4 K/uL (ref 1.7–7.7)
Neutrophils Relative %: 59 %
Platelet Count: 380 K/uL (ref 150–400)
RBC: 3.79 MIL/uL — ABNORMAL LOW (ref 3.87–5.11)
RDW: 14.2 % (ref 11.5–15.5)
WBC Count: 5.8 K/uL (ref 4.0–10.5)
nRBC: 0 % (ref 0.0–0.2)

## 2024-04-27 MED ORDER — PALONOSETRON HCL INJECTION 0.25 MG/5ML
0.2500 mg | Freq: Once | INTRAVENOUS | Status: AC
  Administered 2024-04-27 (×2): 0.25 mg via INTRAVENOUS
  Filled 2024-04-27: qty 5

## 2024-04-27 MED ORDER — LIDOCAINE-PRILOCAINE 2.5-2.5 % EX CREA: 1.0000 | TOPICAL_CREAM | CUTANEOUS | 0 refills | Status: AC | PRN

## 2024-04-27 MED ORDER — FLUOROURACIL CHEMO INJECTION 2.5 GM/50ML
400.0000 mg/m2 | Freq: Once | INTRAVENOUS | Status: AC
  Administered 2024-04-27 (×2): 750 mg via INTRAVENOUS
  Filled 2024-04-27: qty 15

## 2024-04-27 MED ORDER — OXALIPLATIN CHEMO INJECTION 100 MG/20ML
85.0000 mg/m2 | Freq: Once | INTRAVENOUS | Status: AC
  Administered 2024-04-27 (×2): 150 mg via INTRAVENOUS
  Filled 2024-04-27: qty 20

## 2024-04-27 MED ORDER — DEXAMETHASONE SODIUM PHOSPHATE 10 MG/ML IJ SOLN
10.0000 mg | Freq: Once | INTRAMUSCULAR | Status: AC
  Administered 2024-04-27 (×2): 10 mg via INTRAVENOUS
  Filled 2024-04-27: qty 1

## 2024-04-27 MED ORDER — PROCHLORPERAZINE MALEATE 10 MG PO TABS: 10.0000 mg | ORAL_TABLET | Freq: Four times a day (QID) | ORAL | 2 refills | Status: AC | PRN

## 2024-04-27 MED ORDER — ONDANSETRON HCL 8 MG PO TABS: 8.0000 mg | ORAL_TABLET | Freq: Three times a day (TID) | ORAL | 2 refills | Status: AC | PRN

## 2024-04-27 MED ORDER — SODIUM CHLORIDE 0.9 % IV SOLN
2400.0000 mg/m2 | INTRAVENOUS | Status: DC
  Administered 2024-04-27 (×2): 4400 mg via INTRAVENOUS
  Filled 2024-04-27: qty 88

## 2024-04-27 MED ORDER — LEUCOVORIN CALCIUM INJECTION 350 MG
400.0000 mg/m2 | Freq: Once | INTRAVENOUS | Status: AC
  Administered 2024-04-27 (×2): 736 mg via INTRAVENOUS
  Filled 2024-04-27: qty 36.8

## 2024-04-27 MED ORDER — DEXTROSE 5 % IV SOLN: INTRAVENOUS | Status: DC

## 2024-04-27 NOTE — Patient Instructions (Signed)

## 2024-04-27 NOTE — Patient Instructions (Signed)
 [facility] - A DEPT OF [facility] [place]  Discharge Instructions: Thank you for choosing [facility] to provide your oncology and hematology care.   If you have a lab appointment with the Cancer Center, please go directly to the Cancer Center and check in at the registration area.   Wear comfortable clothing and clothing appropriate for easy access to any Portacath or PICC line.   We strive to give you quality time with your provider. You may need to reschedule your appointment if you arrive late (15 or more minutes).  Arriving late affects you and other patients whose appointments are after yours.  Also, if you miss three or more appointments without notifying the office, you may be dismissed from the clinic at the provider's discretion.      For prescription refill requests, have your pharmacy contact our office and allow 72 hours for refills to be completed.    Today you received the following chemotherapy and/or immunotherapy agents: Oxaliplatin , Leucovorin , Fluorouracil  (5-FU)      To help prevent nausea and vomiting after your treatment, we encourage you to take your nausea medication as directed.  BELOW ARE SYMPTOMS THAT SHOULD BE REPORTED IMMEDIATELY: *FEVER GREATER THAN 100.4 F (38 C) OR HIGHER *CHILLS OR SWEATING *NAUSEA AND VOMITING THAT IS NOT CONTROLLED WITH YOUR NAUSEA MEDICATION *UNUSUAL SHORTNESS OF BREATH *UNUSUAL BRUISING OR BLEEDING *URINARY PROBLEMS (pain or burning when urinating, or frequent urination) *BOWEL PROBLEMS (unusual diarrhea, constipation, pain near the anus) TENDERNESS IN MOUTH AND THROAT WITH OR WITHOUT PRESENCE OF ULCERS (sore throat, sores in mouth, or a toothache) UNUSUAL RASH, SWELLING OR PAIN  UNUSUAL VAGINAL DISCHARGE OR ITCHING   Items with * indicate a potential emergency and should be followed up as soon as possible or go to the Emergency Department if any problems should occur.  Please show the  CHEMOTHERAPY ALERT CARD or IMMUNOTHERAPY ALERT CARD at check-in to the Emergency Department and triage nurse.  Should you have questions after your visit or need to cancel or reschedule your appointment, please contact [facility] - A DEPT OF [facility]  Dept: [phone]  and follow the prompts.  Office hours are 8:00 a.m. to 4:30 p.m. Monday - Friday. Please note that voicemails left after 4:00 p.m. may not be returned until the following business day.  We are closed weekends and major holidays. You have access to a nurse at all times for urgent questions. Please call the main number to the clinic Dept: [phone] and follow the prompts.   For any non-urgent questions, you may also contact your provider using MyChart. We now offer e-Visits for anyone 82 and older to request care online for non-urgent symptoms. For details visit [email or web address].   Also download the MyChart app! Go to the app store, search MyChart, open the app, select [place], and log in with your MyChart username and password.  Oxaliplatin  Injection What is this medication? OXALIPLATIN  (ox AL i PLA tin) treats colorectal cancer. It works by slowing down the growth of cancer cells. This medicine may be used for other purposes; ask your health care provider or pharmacist if you have questions. COMMON BRAND NAME(S): Eloxatin  What should I tell my care team before I take this medication? They need to know if you have any of these conditions: Heart disease History of irregular heartbeat or rhythm Liver disease Low blood cell levels (white cells, red cells, and platelets) Lung or  breathing disease, such as asthma Take medications that treat or prevent blood clots Tingling of the fingers, toes, or other nerve disorder An unusual or allergic reaction to oxaliplatin , other medications, foods, dyes, or preservatives If you or your partner are pregnant or trying to get  pregnant Breast-feeding How should I use this medication? This medication is injected into a vein. It is given by your care team in a hospital or clinic setting. Talk to your care team about the use of this medication in children. Special care may be needed. Overdosage: If you think you have taken too much of this medicine contact a poison control center or emergency room at once. NOTE: This medicine is only for you. Do not share this medicine with others. What if I miss a dose? Keep appointments for follow-up doses. It is important not to miss a dose. Call your care team if you are unable to keep an appointment. What may interact with this medication? Do not take this medication with any of the following: Cisapride Dronedarone Pimozide Thioridazine This medication may also interact with the following: Aspirin  and aspirin -like medications Certain medications that treat or prevent blood clots, such as warfarin, apixaban, dabigatran, and rivaroxaban Cisplatin Cyclosporine Diuretics Medications for infection, such as acyclovir, adefovir, amphotericin B, bacitracin , cidofovir, foscarnet, ganciclovir, gentamicin, pentamidine, vancomycin NSAIDs, medications for pain and inflammation, such as ibuprofen or naproxen  Other medications that cause heart rhythm changes Pamidronate Zoledronic acid This list may not describe all possible interactions. Give your health care provider a list of all the medicines, herbs, non-prescription drugs, or dietary supplements you use. Also tell them if you smoke, drink alcohol, or use illegal drugs. Some items may interact with your medicine. What should I watch for while using this medication? Your condition will be monitored carefully while you are receiving this medication. You may need blood work while taking this medication. This medication may make you feel generally unwell. This is not uncommon as chemotherapy can affect healthy cells as well as cancer  cells. Report any side effects. Continue your course of treatment even though you feel ill unless your care team tells you to stop. This medication may increase your risk of getting an infection. Call your care team for advice if you get a fever, chills, sore throat, or other symptoms of a cold or flu. Do not treat yourself. Try to avoid being around people who are sick. Avoid taking medications that contain aspirin , acetaminophen , ibuprofen, naproxen , or ketoprofen unless instructed by your care team. These medications may hide a fever. Be careful brushing or flossing your teeth or using a toothpick because you may get an infection or bleed more easily. If you have any dental work done, tell your dentist you are receiving this medication. This medication can make you more sensitive to cold. Do not drink cold drinks or use ice. Cover exposed skin before coming in contact with cold temperatures or cold objects. When out in cold weather wear warm clothing and cover your mouth and nose to warm the air that goes into your lungs. Tell your care team if you get sensitive to the cold. Talk to your care team if you or your partner are pregnant or think either of you might be pregnant. This medication can cause serious birth defects if taken during pregnancy and for 9 months after the last dose. A negative pregnancy test is required before starting this medication. A reliable form of contraception is recommended while taking this medication and for 9  months after the last dose. Talk to your care team about effective forms of contraception. Do not father a child while taking this medication and for 6 months after the last dose. Use a condom while having sex during this time period. Do not breastfeed while taking this medication and for 3 months after the last dose. This medication may cause infertility. Talk to your care team if you are concerned about your fertility. What side effects may I notice from receiving this  medication? Side effects that you should report to your care team as soon as possible: Allergic reactions--skin rash, itching, hives, swelling of the face, lips, tongue, or throat Bleeding--bloody or black, tar-like stools, vomiting blood or brown material that looks like coffee grounds, red or dark brown urine, small red or purple spots on skin, unusual bruising or bleeding Dry cough, shortness of breath or trouble breathing Heart rhythm changes--fast or irregular heartbeat, dizziness, feeling faint or lightheaded, chest pain, trouble breathing Infection--fever, chills, cough, sore throat, wounds that don't heal, pain or trouble when passing urine, general feeling of discomfort or being unwell Liver injury--right upper belly pain, loss of appetite, nausea, light-colored stool, dark yellow or brown urine, yellowing skin or eyes, unusual weakness or fatigue Low red blood cell level--unusual weakness or fatigue, dizziness, headache, trouble breathing Muscle injury--unusual weakness or fatigue, muscle pain, dark yellow or brown urine, decrease in amount of urine Pain, tingling, or numbness in the hands or feet Sudden and severe headache, confusion, change in vision, seizures, which may be signs of posterior reversible encephalopathy syndrome (PRES) Unusual bruising or bleeding Side effects that usually do not require medical attention (report to your care team if they continue or are bothersome): Diarrhea Nausea Pain, redness, or swelling with sores inside the mouth or throat Unusual weakness or fatigue Vomiting This list may not describe all possible side effects. Call your doctor for medical advice about side effects. You may report side effects to FDA at 1-800-FDA-1088. Where should I keep my medication? This medication is given in a hospital or clinic. It will not be stored at home. NOTE: This sheet is a summary. It may not cover all possible information. If you have questions about this  medicine, talk to your doctor, pharmacist, or health care provider.  2024 Elsevier/Gold Standard (2023-08-15 00:00:00)  Leucovorin  Injection What is this medication? LEUCOVORIN  (loo koe VOR in) prevents side effects from certain medications, such as methotrexate. It works by increasing folate levels. This helps protect healthy cells in your body. It may also be used to treat anemia caused by low levels of folate. It can also be used with fluorouracil , a type of chemotherapy, to treat colorectal cancer. It works by increasing the effects of fluorouracil  in the body. This medicine may be used for other purposes; ask your health care provider or pharmacist if you have questions. What should I tell my care team before I take this medication? They need to know if you have any of these conditions: Anemia from low levels of vitamin B12 in the blood An unusual or allergic reaction to leucovorin , folic acid, other medications, foods, dyes, or preservatives Pregnant or trying to get pregnant Breastfeeding How should I use this medication? This medication is injected into a vein or a muscle. It is given by your care team in a hospital or clinic setting. Talk to your care team about the use of this medication in children. Special care may be needed. Overdosage: If you think you have taken too  much of this medicine contact a poison control center or emergency room at once. NOTE: This medicine is only for you. Do not share this medicine with others. What if I miss a dose? Keep appointments for follow-up doses. It is important not to miss your dose. Call your care team if you are unable to keep an appointment. What may interact with this medication? Capecitabine Fluorouracil  Phenobarbital Phenytoin Primidone Trimethoprim;sulfamethoxazole This list may not describe all possible interactions. Give your health care provider a list of all the medicines, herbs, non-prescription drugs, or dietary supplements  you use. Also tell them if you smoke, drink alcohol, or use illegal drugs. Some items may interact with your medicine. What should I watch for while using this medication? Your condition will be monitored carefully while you are receiving this medication. This medication may increase the side effects of 5-fluorouracil . Tell your care team if you have diarrhea or mouth sores that do not get better or that get worse. What side effects may I notice from receiving this medication? Side effects that you should report to your care team as soon as possible: Allergic reactions--skin rash, itching, hives, swelling of the face, lips, tongue, or throat This list may not describe all possible side effects. Call your doctor for medical advice about side effects. You may report side effects to FDA at 1-800-FDA-1088. Where should I keep my medication? This medication is given in a hospital or clinic. It will not be stored at home. NOTE: This sheet is a summary. It may not cover all possible information. If you have questions about this medicine, talk to your doctor, pharmacist, or health care provider.  2024 Elsevier/Gold Standard (2022-02-05 00:00:00)  Fluorouracil  Injection What is this medication? FLUOROURACIL  (flure oh YOOR a sil) treats some types of cancer. It works by slowing down the growth of cancer cells. This medicine may be used for other purposes; ask your health care provider or pharmacist if you have questions. COMMON BRAND NAME(S): Adrucil  What should I tell my care team before I take this medication? They need to know if you have any of these conditions: Blood disorders Dihydropyrimidine dehydrogenase (DPD) deficiency Infection, such as chickenpox, cold sores, herpes Kidney disease Liver disease Poor nutrition Recent or ongoing radiation therapy An unusual or allergic reaction to fluorouracil , other medications, foods, dyes, or preservatives If you or your partner are pregnant or  trying to get pregnant Breast-feeding How should I use this medication? This medication is injected into a vein. It is administered by your care team in a hospital or clinic setting. Talk to your care team about the use of this medication in children. Special care may be needed. Overdosage: If you think you have taken too much of this medicine contact a poison control center or emergency room at once. NOTE: This medicine is only for you. Do not share this medicine with others. What if I miss a dose? Keep appointments for follow-up doses. It is important not to miss your dose. Call your care team if you are unable to keep an appointment. What may interact with this medication? Do not take this medication with any of the following: Live virus vaccines This medication may also interact with the following: Medications that treat or prevent blood clots, such as warfarin, enoxaparin, dalteparin This list may not describe all possible interactions. Give your health care provider a list of all the medicines, herbs, non-prescription drugs, or dietary supplements you use. Also tell them if you smoke, drink alcohol, or  use illegal drugs. Some items may interact with your medicine. What should I watch for while using this medication? Your condition will be monitored carefully while you are receiving this medication. This medication may make you feel generally unwell. This is not uncommon as chemotherapy can affect healthy cells as well as cancer cells. Report any side effects. Continue your course of treatment even though you feel ill unless your care team tells you to stop. In some cases, you may be given additional medications to help with side effects. Follow all directions for their use. This medication may increase your risk of getting an infection. Call your care team for advice if you get a fever, chills, sore throat, or other symptoms of a cold or flu. Do not treat yourself. Try to avoid being around  people who are sick. This medication may increase your risk to bruise or bleed. Call your care team if you notice any unusual bleeding. Be careful brushing or flossing your teeth or using a toothpick because you may get an infection or bleed more easily. If you have any dental work done, tell your dentist you are receiving this medication. Avoid taking medications that contain aspirin , acetaminophen , ibuprofen, naproxen , or ketoprofen unless instructed by your care team. These medications may hide a fever. Do not treat diarrhea with over the counter products. Contact your care team if you have diarrhea that lasts more than 2 days or if it is severe and watery. This medication can make you more sensitive to the sun. Keep out of the sun. If you cannot avoid being in the sun, wear protective clothing and sunscreen. Do not use sun lamps, tanning beds, or tanning booths. Talk to your care team if you or your partner wish to become pregnant or think you might be pregnant. This medication can cause serious birth defects if taken during pregnancy and for 3 months after the last dose. A reliable form of contraception is recommended while taking this medication and for 3 months after the last dose. Talk to your care team about effective forms of contraception. Do not father a child while taking this medication and for 3 months after the last dose. Use a condom while having sex during this time period. Do not breastfeed while taking this medication. This medication may cause infertility. Talk to your care team if you are concerned about your fertility. What side effects may I notice from receiving this medication? Side effects that you should report to your care team as soon as possible: Allergic reactions--skin rash, itching, hives, swelling of the face, lips, tongue, or throat Heart attack--pain or tightness in the chest, shoulders, arms, or jaw, nausea, shortness of breath, cold or clammy skin, feeling faint or  lightheaded Heart failure--shortness of breath, swelling of the ankles, feet, or hands, sudden weight gain, unusual weakness or fatigue Heart rhythm changes--fast or irregular heartbeat, dizziness, feeling faint or lightheaded, chest pain, trouble breathing High ammonia level--unusual weakness or fatigue, confusion, loss of appetite, nausea, vomiting, seizures Infection--fever, chills, cough, sore throat, wounds that don't heal, pain or trouble when passing urine, general feeling of discomfort or being unwell Low red blood cell level--unusual weakness or fatigue, dizziness, headache, trouble breathing Pain, tingling, or numbness in the hands or feet, muscle weakness, change in vision, confusion or trouble speaking, loss of balance or coordination, trouble walking, seizures Redness, swelling, and blistering of the skin over hands and feet Severe or prolonged diarrhea Unusual bruising or bleeding Side effects that usually do not  require medical attention (report to your care team if they continue or are bothersome): Dry skin Headache Increased tears Nausea Pain, redness, or swelling with sores inside the mouth or throat Sensitivity to light Vomiting This list may not describe all possible side effects. Call your doctor for medical advice about side effects. You may report side effects to FDA at 1-800-FDA-1088. Where should I keep my medication? This medication is given in a hospital or clinic. It will not be stored at home. NOTE: This sheet is a summary. It may not cover all possible information. If you have questions about this medicine, talk to your doctor, pharmacist, or health care provider.  2024 Elsevier/Gold Standard (2022-01-08 00:00:00)

## 2024-04-27 NOTE — Progress Notes (Signed)
 [facility] CSW Progress Note  Visual merchandiser met with patient during first treatment to follow up on psychosocial and SDOH needs. Patient reported first treatment has been going well, and it's better than expected. Patient denied any levels of distress during visit. Patient has blank SNAP application, and will complete documents soon. Patient understands once it's completed to return back to CSW/patient's nurse. Patient continues to be connected with spiritual care.   Follow Up Plan:  CSW will follow-up with patient by phone  in two weeks   Lizbeth Sprague, LCSW Clinical Social Worker [facility]

## 2024-04-28 ENCOUNTER — Encounter: Payer: Self-pay | Admitting: Oncology

## 2024-04-28 NOTE — Telephone Encounter (Signed)
 Spoke with pt via phone regarding 1st chemo follow-up after receiving folfox on 04/27/2024. Pt reports that she is resting but doing well overall, able to eat drink with no nausea/vomiting. Pt expressed concerns over sensitivity of mouth when eating hot and cold drinks/food. This RN inofrmed pt that it was a normal side effect of Oxaliplatin  that should resolve with time. This RN informed pt to let her provider know at her next visit of the sensitivity. Pt verbalizes understanding and confirms her appt tomorrow at [facility] (04/29/2024)

## 2024-04-28 NOTE — Progress Notes (Signed)
 The proposed treatment discussed in conference is for discussion purpose only and is not a binding recommendation.  The patients have not been physically examined, or presented with their treatment options.  Therefore, final treatment plans cannot be decided.

## 2024-04-29 ENCOUNTER — Encounter: Payer: Self-pay | Admitting: *Deleted

## 2024-04-29 ENCOUNTER — Inpatient Hospital Stay

## 2024-04-29 VITALS — BP 111/71 | HR 58 | Temp 98.1°F

## 2024-04-29 DIAGNOSIS — Z5111 Encounter for antineoplastic chemotherapy: Secondary | ICD-10-CM | POA: Diagnosis not present

## 2024-04-29 DIAGNOSIS — C179 Malignant neoplasm of small intestine, unspecified: Secondary | ICD-10-CM

## 2024-04-29 NOTE — Progress Notes (Signed)
 Fax received from [facility] that tissue has been received for testing.

## 2024-04-29 NOTE — Patient Instructions (Signed)

## 2024-05-03 ENCOUNTER — Inpatient Hospital Stay

## 2024-05-03 ENCOUNTER — Inpatient Hospital Stay: Admitting: Nutrition

## 2024-05-03 NOTE — Progress Notes (Signed)
 Nursing requested RD contact patient for questions on her low fiber diet. Patient feels overwhelmed with the amount of information she has received. She states the diet is very, very hard.  States she received the detailed packet of nutrition information but cannot find it right now. She does not know what to eat.  Reviewed low fiber diet with her again. I will email a copy of it to her so she will have a list of allowed foods. She is requesting a grocery list. I will try to put something together for her from the allowed food lists.   Provided name and contact information in email. I am scheduled to see her on August 25 during infusion.

## 2024-05-03 NOTE — Progress Notes (Signed)
 [facility] CSW Progress Note  Clinical Child psychotherapist contacted patient by phone In lieu of spiritual care due to provider absence. Patient expressed lower mood due to treatment and diagnosis. Patient / CSW discussed varying reasons that could be attributed to her mood, and ways to manage the emotions experienced. CSW collaborated with Spiritual Care provider on ways to support patient going forward. CSW messaged dietician as requested by patient. CSW will staff physical symptoms with nurse navigator upon her return. Spiritual Care provider will follow up with patient mid week for extra support.    Follow Up Plan:  CSW will follow-up with patient by phone     Lizbeth Sprague, LCSW Clinical Social Worker [facility]

## 2024-05-04 ENCOUNTER — Encounter: Payer: Self-pay | Admitting: General Practice

## 2024-05-04 NOTE — Progress Notes (Signed)
 [facility] Spiritual Care Note  Followed up with Ms [facility] by phone for 60-minute conversation. Reviewed spiritual/emotional/physical/social concerns, goals and to-do items, and prayer needs.  One continued question on Ms Gimpel's mind is how effective her chemo treatment will be; encouraged her to speak with providers about treatment plan/schedule and possible incremental scanning to gauge progress. Ms Jacquot plans to document her questions, symptoms, and goals in a treatment notebook to help centralize her thoughts and progress.  She also reports that she feels some depression and hasn't left the house since Thursday (04/29/2024), primarily staying in bed and sleeping a lot. Encouraged her to discuss this with her providers re mood needs vs physical needs/energy levels, as well as to self-monitor over time.   Provided compassionate presence, empathic listening, spiritual and emotional support, problem-solving brainstorms, and prayer per request.  We plan to follow up by phone late next week.   05/04/24 1200  Spiritual Encounters  Type of Visit Follow up  Care provided to: Patient  Referral source Chaplain assessment  Reason for visit Routine spiritual support  Spiritual Framework  Presenting Themes Coping tools;Courage hope and growth  Goals  Self/Personal Goals use notebook for provider questions, symptom/side effect documentation, goals lists, etc  Clinical Care Goals provide spiritual/prayer support; provide support/accountability for asking questions & meeting goals   Chaplain Olam Filiberto Lemming, [facility] Pager [phone] Voicemail [phone]

## 2024-05-06 NOTE — Addendum Note (Signed)
 Encounter addended by: Crawford Dorothyann POUR on: 05/06/2024 3:51 PM  Actions taken: Imaging Exam ended, Charge Capture section accepted

## 2024-05-07 ENCOUNTER — Telehealth: Payer: Self-pay

## 2024-05-07 ENCOUNTER — Encounter: Payer: Self-pay | Admitting: General Practice

## 2024-05-07 NOTE — Telephone Encounter (Signed)
 [facility] CSW Progress Note  Clinical Child psychotherapist contacted patient by phone to follow-up on emotional support. Patient reported to be in higher spirits due to a visit from her sister. CSW discussed with patient initiating clinical counseling with [facility] Clinical Intern. Patient expressed interest and stated she believes it would help her. CSW informed patient she would discuss further with updates.    Follow Up Plan:  Patient will contact CSW with any support or resource needs    Lizbeth Sprague, LCSW Clinical Social Worker [facility]

## 2024-05-07 NOTE — Progress Notes (Signed)
 [facility] Spiritual Care Note  Reached Elaine Simon for follow-up phone call at an inconvenient time, so we plan to follow up next week instead.  [address] Elaine Simon, [place], [facility] Pager [phone] Voicemail [phone]

## 2024-05-09 ENCOUNTER — Other Ambulatory Visit: Payer: Self-pay | Admitting: Oncology

## 2024-05-10 ENCOUNTER — Inpatient Hospital Stay

## 2024-05-10 ENCOUNTER — Encounter: Payer: Self-pay | Admitting: Nurse Practitioner

## 2024-05-10 ENCOUNTER — Encounter: Payer: Self-pay | Admitting: Oncology

## 2024-05-10 ENCOUNTER — Inpatient Hospital Stay: Admitting: Nutrition

## 2024-05-10 ENCOUNTER — Inpatient Hospital Stay (HOSPITAL_BASED_OUTPATIENT_CLINIC_OR_DEPARTMENT_OTHER): Admitting: Nurse Practitioner

## 2024-05-10 VITALS — BP 90/63 | HR 83 | Temp 97.3°F | Resp 18 | Ht 61.0 in | Wt 170.5 lb

## 2024-05-10 DIAGNOSIS — C179 Malignant neoplasm of small intestine, unspecified: Secondary | ICD-10-CM

## 2024-05-10 DIAGNOSIS — Z5111 Encounter for antineoplastic chemotherapy: Secondary | ICD-10-CM | POA: Diagnosis not present

## 2024-05-10 LAB — CBC WITH DIFFERENTIAL (CANCER CENTER ONLY)
Abs Immature Granulocytes: 0 K/uL (ref 0.00–0.07)
Basophils Absolute: 0 K/uL (ref 0.0–0.1)
Basophils Relative: 0 %
Eosinophils Absolute: 0 K/uL (ref 0.0–0.5)
Eosinophils Relative: 2 %
HCT: 29.8 % — ABNORMAL LOW (ref 36.0–46.0)
Hemoglobin: 9.5 g/dL — ABNORMAL LOW (ref 12.0–15.0)
Immature Granulocytes: 0 %
Lymphocytes Relative: 65 %
Lymphs Abs: 1.5 K/uL (ref 0.7–4.0)
MCH: 27 pg (ref 26.0–34.0)
MCHC: 31.9 g/dL (ref 30.0–36.0)
MCV: 84.7 fL (ref 80.0–100.0)
Monocytes Absolute: 0.1 K/uL (ref 0.1–1.0)
Monocytes Relative: 4 %
Neutro Abs: 0.7 K/uL — ABNORMAL LOW (ref 1.7–7.7)
Neutrophils Relative %: 29 %
Platelet Count: 245 K/uL (ref 150–400)
RBC: 3.52 MIL/uL — ABNORMAL LOW (ref 3.87–5.11)
RDW: 14.4 % (ref 11.5–15.5)
WBC Count: 2.3 K/uL — ABNORMAL LOW (ref 4.0–10.5)
nRBC: 0 % (ref 0.0–0.2)

## 2024-05-10 LAB — CMP (CANCER CENTER ONLY)
ALT: 19 U/L (ref 0–44)
AST: 21 U/L (ref 15–41)
Albumin: 4 g/dL (ref 3.5–5.0)
Alkaline Phosphatase: 110 U/L (ref 38–126)
Anion gap: 13 (ref 5–15)
BUN: 9 mg/dL (ref 8–23)
CO2: 21 mmol/L — ABNORMAL LOW (ref 22–32)
Calcium: 9.4 mg/dL (ref 8.9–10.3)
Chloride: 108 mmol/L (ref 98–111)
Creatinine: 0.94 mg/dL (ref 0.44–1.00)
GFR, Estimated: >60 mL/min (ref >60)
Glucose, Bld: 131 mg/dL — ABNORMAL HIGH (ref 70–99)
Potassium: 3.7 mmol/L (ref 3.5–5.1)
Sodium: 141 mmol/L (ref 135–145)
Total Bilirubin: 0.2 mg/dL (ref 0.0–1.2)
Total Protein: 6.6 g/dL (ref 6.5–8.1)

## 2024-05-10 MED ORDER — SODIUM CHLORIDE 0.9 % IV SOLN: INTRAVENOUS | Status: AC

## 2024-05-10 NOTE — Patient Instructions (Signed)
 Dehydration, Adult Dehydration is a condition in which there is not enough water or other fluids in the body. This happens when a person loses more fluids than they take in. Important organs cannot work right without the right amount of fluids. Any loss of fluids from the body can cause dehydration. Dehydration can be mild, worse, or very bad. It should be treated right away to keep it from getting very bad. What are the causes? Conditions that cause loss of water in the body. They include: Watery poop (diarrhea). Vomiting. Sweating a lot. Fever. Infection. Peeing (urinating) a lot. Not drinking enough fluids. Certain medicines, such as medicines that take extra fluid out of the body (diuretics). Lack of safe drinking water. Not being able to get enough water and food. What increases the risk? Having a long-term (chronic) illness that has not been treated the right way, such as: Diabetes. Heart disease. Kidney disease. Being 25 years of age or older. Having a disability. Living in a place that is high above the ground or sea (high in altitude). The thinner, drier air causes more fluid loss. Doing exercises that put stress on your body for a long time. Being active when in hot places. What are the signs or symptoms? Symptoms of dehydration depend on how bad it is. Mild or worse dehydration Thirst. Dry lips or dry mouth. Feeling dizzy or light-headed. Muscle cramps. Passing little pee or dark pee. Pee may be the color of tea. Headache. Very bad dehydration Changes in skin. Skin may: Be cold to the touch (clammy). Be blotchy or pale. Not go back to normal right after you pinch it and let it go. Little or no tears, pee, or sweat. Fast breathing. Low blood pressure. Weak pulse. Pulse that is more than 100 beats a minute when you are sitting still. Other changes, such as: Feeling very thirsty. Eyes that look hollow (sunken). Cold hands and feet. Being confused. Being very  tired (lethargic) or having trouble waking from sleep. Losing weight. Loss of consciousness. How is this treated? Treatment for this condition depends on how bad your dehydration is. Treatment should start right away. Do not wait until your condition gets very bad. Very bad dehydration is an emergency. You will need to go to a hospital. Mild or worse dehydration can be treated at home. You may be asked to: Drink more fluids. Drink an oral rehydration solution (ORS). This drink gives you the right amount of fluids, salts, and minerals (electrolytes). Very bad dehydration can be treated: With fluids through an IV tube. By correcting low levels of electrolytes in the body. By treating the problem that caused your dehydration. Follow these instructions at home: Oral rehydration solution If told by your doctor, drink an ORS: Make an ORS. Use instructions on the package. Start by drinking small amounts, about  cup (120 mL) every 5-10 minutes. Slowly drink more until you have had the amount that your doctor said to have.  Eating and drinking  Drink enough clear fluid to keep your pee pale yellow. If you were told to drink an ORS, finish the ORS first. Then, start slowly drinking other clear fluids. Drink fluids such as: Water. Do not drink only water. Doing that can make the salt (sodium) level in your body get too low. Water from ice chips you suck on. Fruit juice that you have added water to (diluted). Low-calorie sports drinks. Eat foods that have the right amounts of salts and minerals, such as bananas, oranges, potatoes,  tomatoes, or spinach. Do not drink alcohol. Avoid drinks that have caffeine or sugar. These include:: High-calorie sports drinks. Fruit juice that you did not add water to. Soda. Coffee or energy drinks. Avoid foods that are greasy or have a lot of fat or sugar. General instructions Take over-the-counter and prescription medicines only as told by your doctor. Do  not take sodium tablets. Doing that can make the salt level in your body get too high. Return to your normal activities as told by your doctor. Ask your doctor what activities are safe for you. Keep all follow-up visits. Your doctor may check and change your treatment. Contact a doctor if: You have pain in your belly (abdomen) and the pain: Gets worse. Stays in one place. You have a rash. You have a stiff neck. You get angry or annoyed more easily than normal. You are more tired or have a harder time waking than normal. You feel weak or dizzy. You feel very thirsty. Get help right away if: You have any symptoms of very bad dehydration. You vomit every time you eat or drink. Your vomiting gets worse, does not go away, or you vomit blood or green stuff. You are getting treatment, but symptoms are getting worse. You have a fever. You have a very bad headache. You have: Diarrhea that gets worse or does not go away. Blood in your poop (stool). This may cause poop to look black and tarry. No pee in 6-8 hours. Only a small amount of pee in 6-8 hours, and the pee is very dark. You have trouble breathing. These symptoms may be an emergency. Get help right away. Call 911. Do not wait to see if the symptoms will go away. Do not drive yourself to the hospital. This information is not intended to replace advice given to you by your health care provider. Make sure you discuss any questions you have with your health care provider. Document Revised: 04/01/2022 Document Reviewed: 04/01/2022 Elsevier Patient Education  2024 [employer].

## 2024-05-10 NOTE — Progress Notes (Signed)
 Nutrition follow up completed with patient and daughter during IVF. Chemotherapy held today. 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  Weight stable at 170 pounds 8 oz today from 170 pounds 9.6 oz on August 7.  Labs include Glucose 131.  Reports cold sensitivity lasted 5 days. She developed diarrhea after her 1st treatment and this has continued. She is still trying to figure out the low fiber diet. Reviewed allowed and not allowed foods again today and provided additional handouts. Encouraged small frequent meals and snacks for weight maintenance. Demonstrated how to look about fiber content of foods and how to read labels. Reports drinking about 64 oz fluid daily.  Nutrition Diagnosis: Food and Nutrition Related Knowledge Deficit, continues but improved  Intervention: Continue small frequent meals and snacks with adequate calories and protein. Reviewed low fiber diet and provided additional fact sheets. Continue to chew food well. Continue adequate water daily. ONS as needed.  Read labels for fiber content.  Monitoring, Evaluation, Goals: Tolerate adequate calories and protein to minimize weight loss.  Next Visit: To be scheduled as needed. Patient has RD contact information for questions or concerns.

## 2024-05-10 NOTE — Progress Notes (Signed)
 Patient seen by Olam Ned NP today  Vitals are within treatment parameters:Yes   Labs are within treatment parameters: No (Please specify and give further instructions.) ANC 0.7  Treatment plan has been signed: Yes   Per physician team, Patient will not be receiving treatment today. Will receive 1 liter NS over 2 hours with repeat v/s at 500 cc and end of hydration. Will add Udenyca to next treatment (managed care notified).  Please collect stool for D. Diff if she has BM while in tx area.

## 2024-05-10 NOTE — Patient Instructions (Signed)

## 2024-05-10 NOTE — Progress Notes (Signed)
  [facility] OFFICE PROGRESS NOTE   Diagnosis: Duodenal carcinoma  INTERVAL HISTORY:   [name] returns as scheduled.  She completed cycle 1 FOLFOX 04/27/2024.  She denies nausea/vomiting.  No mouth sores.  She developed diarrhea 3 to 4 days after chemotherapy.  She continues to have loose stools.  She takes Imodium if needed.  No hand or foot pain or redness.  Cold sensitivity lasted about a week.  Objective:  Vital signs in last 24 hours:  Blood pressure 90/63, pulse 83, temperature (!) 97.3 F (36.3 C), temperature source Temporal, resp. rate 18, height 5' 1 (1.549 m), weight 170 lb 8 oz (77.3 kg), SpO2 97%.    HEENT: No thrush or ulcers. Resp: Lungs clear bilaterally. Cardio: Regular rate and rhythm. GI: No hepatosplenomegaly.  Nontender. Vascular: No leg edema. Neuro: Alert and oriented. Skin: Palms without erythema. Port-A-Cath without erythema.  Lab Results:  Lab Results  Component Value Date   WBC 2.3 (L) 05/10/2024   HGB 9.5 (L) 05/10/2024   HCT 29.8 (L) 05/10/2024   MCV 84.7 05/10/2024   PLT 245 05/10/2024   NEUTROABS 0.7 (L) 05/10/2024    Imaging:  No results found.  Medications: I have reviewed the patient's current medications.  Assessment/Plan: Duodenal carcinoma 04/04/2024-CT abdomen/pelvis: Heterogenous partially exophytic mass in the posterior dome of the right liver, heterogenous low-attenuation mass in the descending duodenum with extraluminal extension 04/06/2024 upper endoscopy: Large mass in the second portion of the duodenum, completely obstructing, ulcerated.  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  Peripheral hypermetabolism in the posterior right hepatic lesion.  Several tiny bilateral pulmonary nodules measuring 3 to 5 mm, too small to characterize by PET.  Scattered foci of hypermetabolism along the length of the colon, nonspecific. Cycle 1 FOLFOX 04/27/2024 Treatment held 05/10/2024 due to neutropenia (ANC 0.7) Stage I left breast cancer 1995, status post lumpectomy and left axillary dissection, adjuvant chemotherapy, radiation, and a visa Hypertension Hypothyroidism Osteoarthritis Normocytic anemia July 2025 Anorexia/weight loss secondary to #1 Anxiety/depression Family history of prostate cancer    Disposition: [name] appears stable.  She has completed 1 cycle of FOLFOX.  She is neutropenic today.  We are holding today's treatment and rescheduling for 1 week.  Neutropenic precautions reviewed.  She understands to contact the office with fever, chills, other signs of infection.  We discussed adding G-CSF support with the next cycle of chemotherapy.  We reviewed potential side effects including bone pain, rash, splenic rupture.  She agrees with this plan.  She had diarrhea following cycle 1 FOLFOX.  She continues to have loose stools.  She will submit a stool sample for C. difficile testing.  She is hypotensive.  She may be mildly dehydrated.  She will receive IV fluids today with repeat vital signs.  She will return for follow-up and treatment in 1 week.  We are available to see her sooner if needed.    Olam Ned ANP/GNP-BC   05/10/2024  10:01 AM

## 2024-05-12 ENCOUNTER — Inpatient Hospital Stay

## 2024-05-14 ENCOUNTER — Encounter: Payer: Self-pay | Admitting: General Practice

## 2024-05-14 ENCOUNTER — Encounter: Payer: Self-pay | Admitting: Oncology

## 2024-05-14 MED FILL — Fluorouracil IV Soln 2.5 GM/50ML (50 MG/ML): INTRAVENOUS | Qty: 15 | Status: AC

## 2024-05-14 MED FILL — Fluorouracil IV Soln 5 GM/100ML (50 MG/ML): INTRAVENOUS | Qty: 88 | Status: AC

## 2024-05-14 NOTE — Progress Notes (Signed)
 [facility] Spiritual Care Note  Followed up with Elaine Simon by phone. She reports meaningful support from some close friends with whom she has shared about her diagnosis. Her close relationship with her sister (ca one year apart) who is recovering from a stroke is another source of meaning, purpose, and connection.  Elaine Simon plans to look into support programming that may give her something to look forward to, as well as a meaningful distraction during participation; we talked in particular about the accessibility of [employer] at low-energy times.  We plan to follow up by phone at the end of next week, as she benefits from spiritual/emotional support and prayer.    05/14/24 1100  Spiritual Encounters  Type of Visit Follow up  Care provided to: Patient  Referral source Chaplain assessment  Spiritual Framework  Presenting Themes Meaning/purpose/sources of inspiration;Coping tools;Courage hope and growth;Community and relationships  Community/Connection Friend(s)  Strengths self-reflection, faith, opening up to close friends for support    Elaine Simon, [facility] Pager [phone] Voicemail [phone]

## 2024-05-18 ENCOUNTER — Inpatient Hospital Stay: Attending: Nurse Practitioner

## 2024-05-18 ENCOUNTER — Inpatient Hospital Stay (HOSPITAL_BASED_OUTPATIENT_CLINIC_OR_DEPARTMENT_OTHER): Admitting: Oncology

## 2024-05-18 ENCOUNTER — Inpatient Hospital Stay

## 2024-05-18 VITALS — BP 97/61 | HR 73 | Temp 97.8°F | Resp 18 | Ht 61.0 in | Wt 170.9 lb

## 2024-05-18 DIAGNOSIS — R918 Other nonspecific abnormal finding of lung field: Secondary | ICD-10-CM | POA: Insufficient documentation

## 2024-05-18 DIAGNOSIS — C179 Malignant neoplasm of small intestine, unspecified: Secondary | ICD-10-CM

## 2024-05-18 DIAGNOSIS — C170 Malignant neoplasm of duodenum: Secondary | ICD-10-CM | POA: Insufficient documentation

## 2024-05-18 DIAGNOSIS — E039 Hypothyroidism, unspecified: Secondary | ICD-10-CM | POA: Diagnosis not present

## 2024-05-18 DIAGNOSIS — K769 Liver disease, unspecified: Secondary | ICD-10-CM | POA: Insufficient documentation

## 2024-05-18 DIAGNOSIS — F419 Anxiety disorder, unspecified: Secondary | ICD-10-CM | POA: Insufficient documentation

## 2024-05-18 DIAGNOSIS — M549 Dorsalgia, unspecified: Secondary | ICD-10-CM | POA: Diagnosis not present

## 2024-05-18 DIAGNOSIS — D649 Anemia, unspecified: Secondary | ICD-10-CM | POA: Insufficient documentation

## 2024-05-18 DIAGNOSIS — Z8042 Family history of malignant neoplasm of prostate: Secondary | ICD-10-CM | POA: Diagnosis not present

## 2024-05-18 DIAGNOSIS — R200 Anesthesia of skin: Secondary | ICD-10-CM | POA: Insufficient documentation

## 2024-05-18 DIAGNOSIS — Z5189 Encounter for other specified aftercare: Secondary | ICD-10-CM | POA: Diagnosis not present

## 2024-05-18 DIAGNOSIS — Z853 Personal history of malignant neoplasm of breast: Secondary | ICD-10-CM | POA: Diagnosis not present

## 2024-05-18 DIAGNOSIS — R630 Anorexia: Secondary | ICD-10-CM | POA: Diagnosis not present

## 2024-05-18 DIAGNOSIS — D709 Neutropenia, unspecified: Secondary | ICD-10-CM | POA: Diagnosis not present

## 2024-05-18 DIAGNOSIS — R197 Diarrhea, unspecified: Secondary | ICD-10-CM | POA: Insufficient documentation

## 2024-05-18 DIAGNOSIS — Z23 Encounter for immunization: Secondary | ICD-10-CM | POA: Diagnosis not present

## 2024-05-18 DIAGNOSIS — Z5111 Encounter for antineoplastic chemotherapy: Secondary | ICD-10-CM | POA: Diagnosis present

## 2024-05-18 DIAGNOSIS — I10 Essential (primary) hypertension: Secondary | ICD-10-CM | POA: Diagnosis not present

## 2024-05-18 DIAGNOSIS — Z79630 Long term (current) use of alkylating agent: Secondary | ICD-10-CM | POA: Diagnosis not present

## 2024-05-18 DIAGNOSIS — F32A Depression, unspecified: Secondary | ICD-10-CM | POA: Insufficient documentation

## 2024-05-18 DIAGNOSIS — Z79631 Long term (current) use of antimetabolite agent: Secondary | ICD-10-CM | POA: Diagnosis not present

## 2024-05-18 DIAGNOSIS — M1990 Unspecified osteoarthritis, unspecified site: Secondary | ICD-10-CM | POA: Diagnosis not present

## 2024-05-18 LAB — CMP (CANCER CENTER ONLY)
ALT: 33 U/L (ref 0–44)
AST: 41 U/L (ref 15–41)
Albumin: 3.9 g/dL (ref 3.5–5.0)
Alkaline Phosphatase: 130 U/L — ABNORMAL HIGH (ref 38–126)
Anion gap: 13 (ref 5–15)
BUN: 15 mg/dL (ref 8–23)
CO2: 21 mmol/L — ABNORMAL LOW (ref 22–32)
Calcium: 9.6 mg/dL (ref 8.9–10.3)
Chloride: 105 mmol/L (ref 98–111)
Creatinine: 1.13 mg/dL — ABNORMAL HIGH (ref 0.44–1.00)
GFR, Estimated: 51 mL/min — ABNORMAL LOW (ref >60)
Glucose, Bld: 127 mg/dL — ABNORMAL HIGH (ref 70–99)
Potassium: 3.7 mmol/L (ref 3.5–5.1)
Sodium: 138 mmol/L (ref 135–145)
Total Bilirubin: <0.2 mg/dL (ref 0.0–1.2)
Total Protein: 6.7 g/dL (ref 6.5–8.1)

## 2024-05-18 LAB — CBC WITH DIFFERENTIAL (CANCER CENTER ONLY)
Abs Immature Granulocytes: 0.2 K/uL — ABNORMAL HIGH (ref 0.00–0.07)
Basophils Absolute: 0 K/uL (ref 0.0–0.1)
Basophils Relative: 1 %
Eosinophils Absolute: 0 K/uL (ref 0.0–0.5)
Eosinophils Relative: 1 %
HCT: 31.1 % — ABNORMAL LOW (ref 36.0–46.0)
Hemoglobin: 9.7 g/dL — ABNORMAL LOW (ref 12.0–15.0)
Immature Granulocytes: 3 %
Lymphocytes Relative: 36 %
Lymphs Abs: 2.1 K/uL (ref 0.7–4.0)
MCH: 26.6 pg (ref 26.0–34.0)
MCHC: 31.2 g/dL (ref 30.0–36.0)
MCV: 85.4 fL (ref 80.0–100.0)
Monocytes Absolute: 0.8 K/uL (ref 0.1–1.0)
Monocytes Relative: 13 %
Neutro Abs: 2.7 K/uL (ref 1.7–7.7)
Neutrophils Relative %: 46 %
Platelet Count: 303 K/uL (ref 150–400)
RBC: 3.64 MIL/uL — ABNORMAL LOW (ref 3.87–5.11)
RDW: 14.5 % (ref 11.5–15.5)
WBC Count: 5.8 K/uL (ref 4.0–10.5)
nRBC: 0 % (ref 0.0–0.2)

## 2024-05-18 MED ORDER — SODIUM CHLORIDE 0.9 % IV SOLN
2400.0000 mg/m2 | INTRAVENOUS | Status: DC
  Administered 2024-05-18: 4400 mg via INTRAVENOUS
  Filled 2024-05-18: qty 88

## 2024-05-18 MED ORDER — LEUCOVORIN CALCIUM INJECTION 350 MG
400.0000 mg/m2 | Freq: Once | INTRAVENOUS | Status: AC
  Administered 2024-05-18: 736 mg via INTRAVENOUS
  Filled 2024-05-18: qty 36.8

## 2024-05-18 MED ORDER — PALONOSETRON HCL INJECTION 0.25 MG/5ML
0.2500 mg | Freq: Once | INTRAVENOUS | Status: AC
  Administered 2024-05-18: 0.25 mg via INTRAVENOUS
  Filled 2024-05-18: qty 5

## 2024-05-18 MED ORDER — OXALIPLATIN CHEMO INJECTION 100 MG/20ML
85.0000 mg/m2 | Freq: Once | INTRAVENOUS | Status: AC
  Administered 2024-05-18: 150 mg via INTRAVENOUS
  Filled 2024-05-18: qty 20

## 2024-05-18 MED ORDER — DEXTROSE 5 % IV SOLN: INTRAVENOUS | Status: DC

## 2024-05-18 MED ORDER — FLUOROURACIL CHEMO INJECTION 2.5 GM/50ML
400.0000 mg/m2 | Freq: Once | INTRAVENOUS | Status: AC
  Administered 2024-05-18: 750 mg via INTRAVENOUS
  Filled 2024-05-18: qty 15

## 2024-05-18 MED ORDER — DEXAMETHASONE SODIUM PHOSPHATE 10 MG/ML IJ SOLN
10.0000 mg | Freq: Once | INTRAMUSCULAR | Status: AC
  Administered 2024-05-18: 10 mg via INTRAVENOUS
  Filled 2024-05-18: qty 1

## 2024-05-18 NOTE — Progress Notes (Signed)
 [facility] OFFICE PROGRESS NOTE   Diagnosis: Small bowel carcinoma  INTERVAL HISTORY:   [name] returns as scheduled.  Cycle 2 chemotherapy was held 05/10/2024 due to neutropenia.  She denies nausea following chemotherapy.  She had 2 days of diarrhea relieved with Imodium.  She had prolonged cold sensitivity in the mouth and feet.  She has mild numbness in the left foot.  Nausea/vomiting and abdominal pain she experienced prior to diagnosis has resolved.  She is scheduled for a second opinion appointment with [facility] later this week.  Objective:  Vital signs in last 24 hours:  Blood pressure 97/61, pulse 73, temperature 97.8 F (36.6 C), temperature source Temporal, resp. rate 18, height 5' 1 (1.549 m), weight 170 lb 14.4 oz (77.5 kg), SpO2 98%.    HEENT: No thrush or ulcer Resp: Lungs clear bilaterally Cardio: Regular rate and rhythm GI: No hepatosplenomegaly Vascular: No leg edema  Skin: Palms without erythema  Portacath/PICC-without erythema  Lab Results:  Lab Results  Component Value Date   WBC 5.8 05/18/2024   HGB 9.7 (L) 05/18/2024   HCT 31.1 (L) 05/18/2024   MCV 85.4 05/18/2024   PLT 303 05/18/2024   NEUTROABS 2.7 05/18/2024    CMP  Lab Results  Component Value Date   NA 138 05/18/2024   K 3.7 05/18/2024   CL 105 05/18/2024   CO2 21 (L) 05/18/2024   GLUCOSE 127 (H) 05/18/2024   BUN 15 05/18/2024   CREATININE 1.13 (H) 05/18/2024   CALCIUM  9.6 05/18/2024   PROT 6.7 05/18/2024   ALBUMIN 3.9 05/18/2024   AST 41 05/18/2024   ALT 33 05/18/2024   ALKPHOS 130 (H) 05/18/2024   BILITOT <0.2 05/18/2024   GFRNONAA 51 (L) 05/18/2024   GFRAA 75 09/26/2020    Lab Results  Component Value Date   CEA 114.61 (H) 04/22/2024   Medications: I have reviewed the patient's current medications.   Assessment/Plan: Duodenal carcinoma 04/04/2024-CT abdomen/pelvis: Heterogenous partially exophytic mass in the posterior dome of the right  liver, heterogenous low-attenuation mass in the descending duodenum with extraluminal extension 04/06/2024 upper endoscopy: Large mass in the second portion of the duodenum, completely obstructing, ulcerated.  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  Peripheral hypermetabolism in the posterior right hepatic lesion.  Several tiny bilateral pulmonary nodules measuring 3 to 5 mm, too small to characterize by PET.  Scattered foci of hypermetabolism along the length of the colon, nonspecific. Cycle 1 FOLFOX 04/27/2024 Treatment held 05/10/2024 due to neutropenia (ANC 0.7) Cycle 2 FOLFOX 05/18/2024, Udenyca  Stage I left breast cancer 1995, status post lumpectomy and left axillary dissection, adjuvant chemotherapy, radiation, and a visa Hypertension Hypothyroidism Osteoarthritis Normocytic anemia July 2025 Anorexia/weight loss secondary to #1 Anxiety/depression Family history of prostate cancer      Disposition: Ms. [name] appears stable.  The neutrophil count has recovered.  She will complete cycle 2 FOLFOX today.  We reviewed results of mismatch repair protein testing.  She is not a candidate for immunotherapy.  She understands she may again develop diarrhea and cold sensitivity following this cycle of chemotherapy.  She will use Imodium as needed.  She will receive G-CSF following chemotherapy.  We reviewed potential toxicities associated with G-CSF including the chance of bone pain, rash, and splenic rupture.  She  agrees to proceed.  She will return for an office visit and chemotherapy in 2 weeks.  She will be referred for a restaging evaluation after cycle 5.  She is scheduled for a second opinion appointment at [facility] later this week.  Arley Hof, MD  05/18/2024  10:12 AM

## 2024-05-18 NOTE — Patient Instructions (Signed)

## 2024-05-18 NOTE — Progress Notes (Signed)
 [facility] CSW Progress Note  Visual merchandiser (CSW) met with patient before infusion appointment for follow up on emotional needs as patient continues treatment. Patient detailed challenges that she's experiencing and the domains that are impacted. CSW detailed options for continued emotional support and the option for clinical counseling with [facility] Clinical Intern. Patient has elected to continue spiritual support and initiate clinical counseling. CSW described clinical counseling process and the flow of the initial assessment.    Interventions: Patient interviewed and appropriate assessments performed: PHQ 9  Referred patient to community resources: [employer], inquiry for GI support with cancer services.    Provided brief mental health counseling with regard to increased sadness, fatigue, lack of motivation     Follow Up Plan:  9/10    Lizbeth Sprague, LCSW Clinical Social Worker [facility]

## 2024-05-18 NOTE — Progress Notes (Signed)
 Patient seen by Dr. Arley Hof today  Vitals are within treatment parameters:Yes   Labs are within treatment parameters: Yes   Treatment plan has been signed: Yes   Per physician team, Patient is ready for treatment and there are NO modifications to the treatment plan.

## 2024-05-18 NOTE — Patient Instructions (Signed)
 [facility] - A DEPT OF [facility] [place]  Discharge Instructions: Thank you for choosing [facility] to provide your oncology and hematology care.   If you have a lab appointment with the Cancer Center, please go directly to the Cancer Center and check in at the registration area.   Wear comfortable clothing and clothing appropriate for easy access to any Portacath or PICC line.   We strive to give you quality time with your provider. You may need to reschedule your appointment if you arrive late (15 or more minutes).  Arriving late affects you and other patients whose appointments are after yours.  Also, if you miss three or more appointments without notifying the office, you may be dismissed from the clinic at the provider's discretion.      For prescription refill requests, have your pharmacy contact our office and allow 72 hours for refills to be completed.    Today you received the following chemotherapy and/or immunotherapy agents: Oxaliplatin , Leucovorin , Fluorouracil  (5-FU)      To help prevent nausea and vomiting after your treatment, we encourage you to take your nausea medication as directed.  BELOW ARE SYMPTOMS THAT SHOULD BE REPORTED IMMEDIATELY: *FEVER GREATER THAN 100.4 F (38 C) OR HIGHER *CHILLS OR SWEATING *NAUSEA AND VOMITING THAT IS NOT CONTROLLED WITH YOUR NAUSEA MEDICATION *UNUSUAL SHORTNESS OF BREATH *UNUSUAL BRUISING OR BLEEDING *URINARY PROBLEMS (pain or burning when urinating, or frequent urination) *BOWEL PROBLEMS (unusual diarrhea, constipation, pain near the anus) TENDERNESS IN MOUTH AND THROAT WITH OR WITHOUT PRESENCE OF ULCERS (sore throat, sores in mouth, or a toothache) UNUSUAL RASH, SWELLING OR PAIN  UNUSUAL VAGINAL DISCHARGE OR ITCHING   Items with * indicate a potential emergency and should be followed up as soon as possible or go to the Emergency Department if any problems should occur.  Please show the  CHEMOTHERAPY ALERT CARD or IMMUNOTHERAPY ALERT CARD at check-in to the Emergency Department and triage nurse.  Should you have questions after your visit or need to cancel or reschedule your appointment, please contact [facility] - A DEPT OF [facility]  Dept: [phone]  and follow the prompts.  Office hours are 8:00 a.m. to 4:30 p.m. Monday - Friday. Please note that voicemails left after 4:00 p.m. may not be returned until the following business day.  We are closed weekends and major holidays. You have access to a nurse at all times for urgent questions. Please call the main number to the clinic Dept: [phone] and follow the prompts.   For any non-urgent questions, you may also contact your provider using MyChart. We now offer e-Visits for anyone 82 and older to request care online for non-urgent symptoms. For details visit [email or web address].   Also download the MyChart app! Go to the app store, search MyChart, open the app, select [place], and log in with your MyChart username and password.  Oxaliplatin  Injection What is this medication? OXALIPLATIN  (ox AL i PLA tin) treats colorectal cancer. It works by slowing down the growth of cancer cells. This medicine may be used for other purposes; ask your health care provider or pharmacist if you have questions. COMMON BRAND NAME(S): Eloxatin  What should I tell my care team before I take this medication? They need to know if you have any of these conditions: Heart disease History of irregular heartbeat or rhythm Liver disease Low blood cell levels (white cells, red cells, and platelets) Lung or  breathing disease, such as asthma Take medications that treat or prevent blood clots Tingling of the fingers, toes, or other nerve disorder An unusual or allergic reaction to oxaliplatin , other medications, foods, dyes, or preservatives If you or your partner are pregnant or trying to get  pregnant Breast-feeding How should I use this medication? This medication is injected into a vein. It is given by your care team in a hospital or clinic setting. Talk to your care team about the use of this medication in children. Special care may be needed. Overdosage: If you think you have taken too much of this medicine contact a poison control center or emergency room at once. NOTE: This medicine is only for you. Do not share this medicine with others. What if I miss a dose? Keep appointments for follow-up doses. It is important not to miss a dose. Call your care team if you are unable to keep an appointment. What may interact with this medication? Do not take this medication with any of the following: Cisapride Dronedarone Pimozide Thioridazine This medication may also interact with the following: Aspirin  and aspirin -like medications Certain medications that treat or prevent blood clots, such as warfarin, apixaban, dabigatran, and rivaroxaban Cisplatin Cyclosporine Diuretics Medications for infection, such as acyclovir, adefovir, amphotericin B, bacitracin , cidofovir, foscarnet, ganciclovir, gentamicin, pentamidine, vancomycin NSAIDs, medications for pain and inflammation, such as ibuprofen or naproxen  Other medications that cause heart rhythm changes Pamidronate Zoledronic acid This list may not describe all possible interactions. Give your health care provider a list of all the medicines, herbs, non-prescription drugs, or dietary supplements you use. Also tell them if you smoke, drink alcohol, or use illegal drugs. Some items may interact with your medicine. What should I watch for while using this medication? Your condition will be monitored carefully while you are receiving this medication. You may need blood work while taking this medication. This medication may make you feel generally unwell. This is not uncommon as chemotherapy can affect healthy cells as well as cancer  cells. Report any side effects. Continue your course of treatment even though you feel ill unless your care team tells you to stop. This medication may increase your risk of getting an infection. Call your care team for advice if you get a fever, chills, sore throat, or other symptoms of a cold or flu. Do not treat yourself. Try to avoid being around people who are sick. Avoid taking medications that contain aspirin , acetaminophen , ibuprofen, naproxen , or ketoprofen unless instructed by your care team. These medications may hide a fever. Be careful brushing or flossing your teeth or using a toothpick because you may get an infection or bleed more easily. If you have any dental work done, tell your dentist you are receiving this medication. This medication can make you more sensitive to cold. Do not drink cold drinks or use ice. Cover exposed skin before coming in contact with cold temperatures or cold objects. When out in cold weather wear warm clothing and cover your mouth and nose to warm the air that goes into your lungs. Tell your care team if you get sensitive to the cold. Talk to your care team if you or your partner are pregnant or think either of you might be pregnant. This medication can cause serious birth defects if taken during pregnancy and for 9 months after the last dose. A negative pregnancy test is required before starting this medication. A reliable form of contraception is recommended while taking this medication and for 9  months after the last dose. Talk to your care team about effective forms of contraception. Do not father a child while taking this medication and for 6 months after the last dose. Use a condom while having sex during this time period. Do not breastfeed while taking this medication and for 3 months after the last dose. This medication may cause infertility. Talk to your care team if you are concerned about your fertility. What side effects may I notice from receiving this  medication? Side effects that you should report to your care team as soon as possible: Allergic reactions--skin rash, itching, hives, swelling of the face, lips, tongue, or throat Bleeding--bloody or black, tar-like stools, vomiting blood or brown material that looks like coffee grounds, red or dark brown urine, small red or purple spots on skin, unusual bruising or bleeding Dry cough, shortness of breath or trouble breathing Heart rhythm changes--fast or irregular heartbeat, dizziness, feeling faint or lightheaded, chest pain, trouble breathing Infection--fever, chills, cough, sore throat, wounds that don't heal, pain or trouble when passing urine, general feeling of discomfort or being unwell Liver injury--right upper belly pain, loss of appetite, nausea, light-colored stool, dark yellow or brown urine, yellowing skin or eyes, unusual weakness or fatigue Low red blood cell level--unusual weakness or fatigue, dizziness, headache, trouble breathing Muscle injury--unusual weakness or fatigue, muscle pain, dark yellow or brown urine, decrease in amount of urine Pain, tingling, or numbness in the hands or feet Sudden and severe headache, confusion, change in vision, seizures, which may be signs of posterior reversible encephalopathy syndrome (PRES) Unusual bruising or bleeding Side effects that usually do not require medical attention (report to your care team if they continue or are bothersome): Diarrhea Nausea Pain, redness, or swelling with sores inside the mouth or throat Unusual weakness or fatigue Vomiting This list may not describe all possible side effects. Call your doctor for medical advice about side effects. You may report side effects to FDA at 1-800-FDA-1088. Where should I keep my medication? This medication is given in a hospital or clinic. It will not be stored at home. NOTE: This sheet is a summary. It may not cover all possible information. If you have questions about this  medicine, talk to your doctor, pharmacist, or health care provider.  2024 Elsevier/Gold Standard (2023-08-15 00:00:00)  Leucovorin  Injection What is this medication? LEUCOVORIN  (loo koe VOR in) prevents side effects from certain medications, such as methotrexate. It works by increasing folate levels. This helps protect healthy cells in your body. It may also be used to treat anemia caused by low levels of folate. It can also be used with fluorouracil , a type of chemotherapy, to treat colorectal cancer. It works by increasing the effects of fluorouracil  in the body. This medicine may be used for other purposes; ask your health care provider or pharmacist if you have questions. What should I tell my care team before I take this medication? They need to know if you have any of these conditions: Anemia from low levels of vitamin B12 in the blood An unusual or allergic reaction to leucovorin , folic acid, other medications, foods, dyes, or preservatives Pregnant or trying to get pregnant Breastfeeding How should I use this medication? This medication is injected into a vein or a muscle. It is given by your care team in a hospital or clinic setting. Talk to your care team about the use of this medication in children. Special care may be needed. Overdosage: If you think you have taken too  much of this medicine contact a poison control center or emergency room at once. NOTE: This medicine is only for you. Do not share this medicine with others. What if I miss a dose? Keep appointments for follow-up doses. It is important not to miss your dose. Call your care team if you are unable to keep an appointment. What may interact with this medication? Capecitabine Fluorouracil  Phenobarbital Phenytoin Primidone Trimethoprim;sulfamethoxazole This list may not describe all possible interactions. Give your health care provider a list of all the medicines, herbs, non-prescription drugs, or dietary supplements  you use. Also tell them if you smoke, drink alcohol, or use illegal drugs. Some items may interact with your medicine. What should I watch for while using this medication? Your condition will be monitored carefully while you are receiving this medication. This medication may increase the side effects of 5-fluorouracil . Tell your care team if you have diarrhea or mouth sores that do not get better or that get worse. What side effects may I notice from receiving this medication? Side effects that you should report to your care team as soon as possible: Allergic reactions--skin rash, itching, hives, swelling of the face, lips, tongue, or throat This list may not describe all possible side effects. Call your doctor for medical advice about side effects. You may report side effects to FDA at 1-800-FDA-1088. Where should I keep my medication? This medication is given in a hospital or clinic. It will not be stored at home. NOTE: This sheet is a summary. It may not cover all possible information. If you have questions about this medicine, talk to your doctor, pharmacist, or health care provider.  2024 Elsevier/Gold Standard (2022-02-05 00:00:00)  Fluorouracil  Injection What is this medication? FLUOROURACIL  (flure oh YOOR a sil) treats some types of cancer. It works by slowing down the growth of cancer cells. This medicine may be used for other purposes; ask your health care provider or pharmacist if you have questions. COMMON BRAND NAME(S): Adrucil  What should I tell my care team before I take this medication? They need to know if you have any of these conditions: Blood disorders Dihydropyrimidine dehydrogenase (DPD) deficiency Infection, such as chickenpox, cold sores, herpes Kidney disease Liver disease Poor nutrition Recent or ongoing radiation therapy An unusual or allergic reaction to fluorouracil , other medications, foods, dyes, or preservatives If you or your partner are pregnant or  trying to get pregnant Breast-feeding How should I use this medication? This medication is injected into a vein. It is administered by your care team in a hospital or clinic setting. Talk to your care team about the use of this medication in children. Special care may be needed. Overdosage: If you think you have taken too much of this medicine contact a poison control center or emergency room at once. NOTE: This medicine is only for you. Do not share this medicine with others. What if I miss a dose? Keep appointments for follow-up doses. It is important not to miss your dose. Call your care team if you are unable to keep an appointment. What may interact with this medication? Do not take this medication with any of the following: Live virus vaccines This medication may also interact with the following: Medications that treat or prevent blood clots, such as warfarin, enoxaparin, dalteparin This list may not describe all possible interactions. Give your health care provider a list of all the medicines, herbs, non-prescription drugs, or dietary supplements you use. Also tell them if you smoke, drink alcohol, or  use illegal drugs. Some items may interact with your medicine. What should I watch for while using this medication? Your condition will be monitored carefully while you are receiving this medication. This medication may make you feel generally unwell. This is not uncommon as chemotherapy can affect healthy cells as well as cancer cells. Report any side effects. Continue your course of treatment even though you feel ill unless your care team tells you to stop. In some cases, you may be given additional medications to help with side effects. Follow all directions for their use. This medication may increase your risk of getting an infection. Call your care team for advice if you get a fever, chills, sore throat, or other symptoms of a cold or flu. Do not treat yourself. Try to avoid being around  people who are sick. This medication may increase your risk to bruise or bleed. Call your care team if you notice any unusual bleeding. Be careful brushing or flossing your teeth or using a toothpick because you may get an infection or bleed more easily. If you have any dental work done, tell your dentist you are receiving this medication. Avoid taking medications that contain aspirin , acetaminophen , ibuprofen, naproxen , or ketoprofen unless instructed by your care team. These medications may hide a fever. Do not treat diarrhea with over the counter products. Contact your care team if you have diarrhea that lasts more than 2 days or if it is severe and watery. This medication can make you more sensitive to the sun. Keep out of the sun. If you cannot avoid being in the sun, wear protective clothing and sunscreen. Do not use sun lamps, tanning beds, or tanning booths. Talk to your care team if you or your partner wish to become pregnant or think you might be pregnant. This medication can cause serious birth defects if taken during pregnancy and for 3 months after the last dose. A reliable form of contraception is recommended while taking this medication and for 3 months after the last dose. Talk to your care team about effective forms of contraception. Do not father a child while taking this medication and for 3 months after the last dose. Use a condom while having sex during this time period. Do not breastfeed while taking this medication. This medication may cause infertility. Talk to your care team if you are concerned about your fertility. What side effects may I notice from receiving this medication? Side effects that you should report to your care team as soon as possible: Allergic reactions--skin rash, itching, hives, swelling of the face, lips, tongue, or throat Heart attack--pain or tightness in the chest, shoulders, arms, or jaw, nausea, shortness of breath, cold or clammy skin, feeling faint or  lightheaded Heart failure--shortness of breath, swelling of the ankles, feet, or hands, sudden weight gain, unusual weakness or fatigue Heart rhythm changes--fast or irregular heartbeat, dizziness, feeling faint or lightheaded, chest pain, trouble breathing High ammonia level--unusual weakness or fatigue, confusion, loss of appetite, nausea, vomiting, seizures Infection--fever, chills, cough, sore throat, wounds that don't heal, pain or trouble when passing urine, general feeling of discomfort or being unwell Low red blood cell level--unusual weakness or fatigue, dizziness, headache, trouble breathing Pain, tingling, or numbness in the hands or feet, muscle weakness, change in vision, confusion or trouble speaking, loss of balance or coordination, trouble walking, seizures Redness, swelling, and blistering of the skin over hands and feet Severe or prolonged diarrhea Unusual bruising or bleeding Side effects that usually do not  require medical attention (report to your care team if they continue or are bothersome): Dry skin Headache Increased tears Nausea Pain, redness, or swelling with sores inside the mouth or throat Sensitivity to light Vomiting This list may not describe all possible side effects. Call your doctor for medical advice about side effects. You may report side effects to FDA at 1-800-FDA-1088. Where should I keep my medication? This medication is given in a hospital or clinic. It will not be stored at home. NOTE: This sheet is a summary. It may not cover all possible information. If you have questions about this medicine, talk to your doctor, pharmacist, or health care provider.  2024 Elsevier/Gold Standard (2022-01-08 00:00:00)

## 2024-05-19 ENCOUNTER — Other Ambulatory Visit: Payer: Self-pay

## 2024-05-20 ENCOUNTER — Telehealth: Payer: Self-pay | Admitting: Oncology

## 2024-05-20 ENCOUNTER — Inpatient Hospital Stay

## 2024-05-20 VITALS — BP 138/50 | HR 56 | Temp 98.2°F | Resp 18

## 2024-05-20 DIAGNOSIS — C179 Malignant neoplasm of small intestine, unspecified: Secondary | ICD-10-CM

## 2024-05-20 DIAGNOSIS — Z5111 Encounter for antineoplastic chemotherapy: Secondary | ICD-10-CM | POA: Diagnosis not present

## 2024-05-20 MED ORDER — PEGFILGRASTIM-CBQV 6 MG/0.6ML ~~LOC~~ SOSY
6.0000 mg | PREFILLED_SYRINGE | Freq: Once | SUBCUTANEOUS | Status: AC
  Administered 2024-05-20: 6 mg via SUBCUTANEOUS
  Filled 2024-05-20: qty 0.6

## 2024-05-20 NOTE — Patient Instructions (Signed)
 Hypertension, Adult High blood pressure (hypertension) is when the force of blood pumping through the arteries is too strong. The arteries are the blood vessels that carry blood from the heart throughout the body. Hypertension forces the heart to work harder to pump blood and may cause arteries to become narrow or stiff. Untreated or uncontrolled hypertension can lead to a heart attack, heart failure, a stroke, kidney disease, and other problems. A blood pressure reading consists of a higher number over a lower number. Ideally, your blood pressure should be below 120/80. The first (top) number is called the systolic pressure. It is a measure of the pressure in your arteries as your heart beats. The second (bottom) number is called the diastolic pressure. It is a measure of the pressure in your arteries as the heart relaxes. What are the causes? The exact cause of this condition is not known. There are some conditions that result in high blood pressure. What increases the risk? Certain factors may make you more likely to develop high blood pressure. Some of these risk factors are under your control, including: Smoking. Not getting enough exercise or physical activity. Being overweight. Having too much fat, sugar, calories, or salt (sodium) in your diet. Drinking too much alcohol. Other risk factors include: Having a personal history of heart disease, diabetes, high cholesterol, or kidney disease. Stress. Having a family history of high blood pressure and high cholesterol. Having obstructive sleep apnea. Age. The risk increases with age. What are the signs or symptoms? High blood pressure may not cause symptoms. Very high blood pressure (hypertensive crisis) may cause: Headache. Fast or irregular heartbeats (palpitations). Shortness of breath. Nosebleed. Nausea and vomiting. Vision changes. Severe chest pain, dizziness, and seizures. How is this diagnosed? This condition is diagnosed by  measuring your blood pressure while you are seated, with your arm resting on a flat surface, your legs uncrossed, and your feet flat on the floor. The cuff of the blood pressure monitor will be placed directly against the skin of your upper arm at the level of your heart. Blood pressure should be measured at least twice using the same arm. Certain conditions can cause a difference in blood pressure between your right and left arms. If you have a high blood pressure reading during one visit or you have normal blood pressure with other risk factors, you may be asked to: Return on a different day to have your blood pressure checked again. Monitor your blood pressure at home for 1 week or longer. If you are diagnosed with hypertension, you may have other blood or imaging tests to help your health care provider understand your overall risk for other conditions. How is this treated? This condition is treated by making healthy lifestyle changes, such as eating healthy foods, exercising more, and reducing your alcohol intake. You may be referred for counseling on a healthy diet and physical activity. Your health care provider may prescribe medicine if lifestyle changes are not enough to get your blood pressure under control and if: Your systolic blood pressure is above 130. Your diastolic blood pressure is above 80. Your personal target blood pressure may vary depending on your medical conditions, your age, and other factors. Follow these instructions at home: Eating and drinking  Eat a diet that is high in fiber and potassium, and low in sodium, added sugar, and fat. An example of this eating plan is called the DASH diet. DASH stands for Dietary Approaches to Stop Hypertension. To eat this way: Eat  plenty of fresh fruits and vegetables. Try to fill one half of your plate at each meal with fruits and vegetables. Eat whole grains, such as whole-wheat pasta, brown rice, or whole-grain bread. Fill about one  fourth of your plate with whole grains. Eat or drink low-fat dairy products, such as skim milk or low-fat yogurt. Avoid fatty cuts of meat, processed or cured meats, and poultry with skin. Fill about one fourth of your plate with lean proteins, such as fish, chicken without skin, beans, eggs, or tofu. Avoid pre-made and processed foods. These tend to be higher in sodium, added sugar, and fat. Reduce your daily sodium intake. Many people with hypertension should eat less than 1,500 mg of sodium a day. Do not drink alcohol if: Your health care provider tells you not to drink. You are pregnant, may be pregnant, or are planning to become pregnant. If you drink alcohol: Limit how much you have to: 0-1 drink a day for women. 0-2 drinks a day for men. Know how much alcohol is in your drink. In the [place] one drink equals one 12 oz bottle of beer (355 mL), one 5 oz glass of wine (148 mL), or one 1 oz glass of hard liquor (44 mL). Lifestyle  Work with your health care provider to maintain a healthy body weight or to lose weight. Ask what an ideal weight is for you. Get at least 30 minutes of exercise that causes your heart to beat faster (aerobic exercise) most days of the week. Activities may include walking, swimming, or biking. Include exercise to strengthen your muscles (resistance exercise), such as Pilates or lifting weights, as part of your weekly exercise routine. Try to do these types of exercises for 30 minutes at least 3 days a week. Do not use any products that contain nicotine or tobacco. These products include cigarettes, chewing tobacco, and vaping devices, such as e-cigarettes. If you need help quitting, ask your health care provider. Monitor your blood pressure at home as told by your health care provider. Keep all follow-up visits. This is important. Medicines Take over-the-counter and prescription medicines only as told by your health care provider. Follow directions carefully. Blood  pressure medicines must be taken as prescribed. Do not skip doses of blood pressure medicine. Doing this puts you at risk for problems and can make the medicine less effective. Ask your health care provider about side effects or reactions to medicines that you should watch for. Contact a health care provider if you: Think you are having a reaction to a medicine you are taking. Have headaches that keep coming back (recurring). Feel dizzy. Have swelling in your ankles. Have trouble with your vision. Get help right away if you: Develop a severe headache or confusion. Have unusual weakness or numbness. Feel faint. Have severe pain in your chest or abdomen. Vomit repeatedly. Have trouble breathing. These symptoms may be an emergency. Get help right away. Call 911. Do not wait to see if the symptoms will go away. Do not drive yourself to the hospital. Summary Hypertension is when the force of blood pumping through your arteries is too strong. If this condition is not controlled, it may put you at risk for serious complications. Your personal target blood pressure may vary depending on your medical conditions, your age, and other factors. For most people, a normal blood pressure is less than 120/80. Hypertension is treated with lifestyle changes, medicines, or a combination of both. Lifestyle changes include losing weight, eating a healthy,  low-sodium diet, exercising more, and limiting alcohol. This information is not intended to replace advice given to you by your health care provider. Make sure you discuss any questions you have with your health care provider. Document Revised: 07/10/2021 Document Reviewed: 07/10/2021 Elsevier Patient Education  2024 Elsevier Inc.Pegfilgrastim  Injection What is this medication? PEGFILGRASTIM  (PEG fil gra stim) lowers the risk of infection in people who are receiving chemotherapy. It works by Systems analyst make more white blood cells, which protects your  body from infection. It may also be used to help people who have been exposed to high doses of radiation. This medicine may be used for other purposes; ask your health care provider or pharmacist if you have questions. COMMON BRAND NAME(S): Fulphila , Fylnetra , Neulasta , Nyvepria , Stimufend , UDENYCA , UDENYCA  ONBODY, Ziextenzo  What should I tell my care team before I take this medication? They need to know if you have any of these conditions: Kidney disease Latex allergy Ongoing radiation therapy Sickle cell disease Skin reactions to acrylic adhesives (On-Body Injector only) An unusual or allergic reaction to pegfilgrastim , filgrastim, other medications, foods, dyes, or preservatives Pregnant or trying to get pregnant Breast-feeding How should I use this medication? This medication is for injection under the skin. If you get this medication at home, you will be taught how to prepare and give the pre-filled syringe or how to use the On-body Injector. Refer to the patient Instructions for Use for detailed instructions. Use exactly as directed. Tell your care team immediately if you suspect that the On-body Injector may not have performed as intended or if you suspect the use of the On-body Injector resulted in a missed or partial dose. It is important that you put your used needles and syringes in a special sharps container. Do not put them in a trash can. If you do not have a sharps container, call your pharmacist or care team to get one. Talk to your care team about the use of this medication in children. While this medication may be prescribed for selected conditions, precautions do apply. Overdosage: If you think you have taken too much of this medicine contact a poison control center or emergency room at once. NOTE: This medicine is only for you. Do not share this medicine with others. What if I miss a dose? It is important not to miss your dose. Call your care team if you miss your dose. If you  miss a dose due to an On-body Injector failure or leakage, a new dose should be administered as soon as possible using a single prefilled syringe for manual use. What may interact with this medication? Interactions have not been studied. This list may not describe all possible interactions. Give your health care provider a list of all the medicines, herbs, non-prescription drugs, or dietary supplements you use. Also tell them if you smoke, drink alcohol, or use illegal drugs. Some items may interact with your medicine. What should I watch for while using this medication? Your condition will be monitored carefully while you are receiving this medication. You may need blood work done while you are taking this medication. Talk to your care team about your risk of cancer. You may be more at risk for certain types of cancer if you take this medication. If you are going to need a MRI, CT scan, or other procedure, tell your care team that you are using this medication (On-Body Injector only). What side effects may I notice from receiving this medication? Side effects that you  should report to your care team as soon as possible: Allergic reactions--skin rash, itching, hives, swelling of the face, lips, tongue, or throat Capillary leak syndrome--stomach or muscle pain, unusual weakness or fatigue, feeling faint or lightheaded, decrease in the amount of urine, swelling of the ankles, hands, or feet, trouble breathing High white blood cell level--fever, fatigue, trouble breathing, night sweats, change in vision, weight loss Inflammation of the aorta--fever, fatigue, back, chest, or stomach pain, severe headache Kidney injury (glomerulonephritis)--decrease in the amount of urine, red or dark brown urine, foamy or bubbly urine, swelling of the ankles, hands, or feet Shortness of breath or trouble breathing Spleen injury--pain in upper left stomach or shoulder Unusual bruising or bleeding Side effects that  usually do not require medical attention (report to your care team if they continue or are bothersome): Bone pain Pain in the hands or feet This list may not describe all possible side effects. Call your doctor for medical advice about side effects. You may report side effects to FDA at 1-800-FDA-1088. Where should I keep my medication? Keep out of the reach of children. If you are using this medication at home, you will be instructed on how to store it. Throw away any unused medication after the expiration date on the label. NOTE: This sheet is a summary. It may not cover all possible information. If you have questions about this medicine, talk to your doctor, pharmacist, or health care provider.  2024 Elsevier/Gold Standard (2021-08-03 00:00:00)

## 2024-05-20 NOTE — Addendum Note (Signed)
 Encounter addended by: Crawford Dorothyann POUR on: 05/20/2024 12:50 PM  Actions taken: Imaging Exam ended

## 2024-05-24 ENCOUNTER — Encounter: Payer: Self-pay | Admitting: Oncology

## 2024-05-25 ENCOUNTER — Ambulatory Visit: Admitting: Oncology

## 2024-05-25 ENCOUNTER — Other Ambulatory Visit

## 2024-05-25 ENCOUNTER — Ambulatory Visit

## 2024-05-26 ENCOUNTER — Inpatient Hospital Stay

## 2024-05-26 NOTE — Progress Notes (Signed)
 [facility] CSW Counseling Note  Patient was referred by self. Treatment type: Individual  Presenting Concerns: Patient and/or family reports the following symptoms/concerns: depression and stress Duration of problem: since diagnosis approx 2 months; Severity of problem: mild   Orientation:oriented to person, place, and time/date.   Affect: Congruent Risk of harm to self or others: No plan to harm self or others  Patient and/or Family's Strengths/Protective Factors: Social connections, Social and Emotional competence, Concrete supports in place (healthy food, safe environments, etc.), and Sense of purposeAbility for insight  Active sense of humor  Average or above average intelligence  Capable of independent living  Communication skills  General fund of knowledge  Motivation for treatment/growth  Religious Affiliation  Supportive family/friends      Goals Addressed: Patient will:  Reduce symptoms of: depression and stress by identifying sources of depression and management of effects. In session CSW provided psycho-education on the depression cycles and strategies for getting out it,  Increase knowledge and/or ability of: coping skills by identifying current coping skills used and adding additional options. In session, psycho education of coping was provided with brief discussions of body sensations and labeling / acknowledgment of emotions. Increase healthy adjustment to current life circumstances by increased validation and understanding of current circumstances. In session, CSW provided normalization of emotions and cycle when diagnosed with cancer.    Progress towards Goals: Initial   Interventions: Interventions utilized:  Initial Assessment.   Introduced Adjustment Counseling - limitations and benefits   Identified priorities with counseling    Assessment: This was a shared visit with permission with [facility] Clinical Intern.   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 Cloretta since approx July of 2025 and has completed two cycles of treatment. Britt has history of breast cancer 1995, and completed lumpectomy, axillary dissection, chemoradiation. Today, she presented with concerns about increased depressive feelings indicated by increased fatigue, loss of interest in activities, increased isolation, and tearfulness. These reported symptoms started after initial diagnosis and take place 3-4 times a week. Presently, the reported symptoms are starting to impact her daily life, specifically quality of life. Historically, she reported challenges of baseline of depressive feelings due to grief of the passing of her mother.  She named several sources of depressive feelings: fears of long term effects of treatment, long term quality of life, fear of end of life, symptoms related to her treatment, fatigue, and blame related to the diagnosis itself.     Plan: Follow up with CSW: in two weeks 9/24 at 1:00 PM Behavioral recommendations: None at time.  Referral(s): Support group(s):  previously added to waitlist for GI group. NO additional referrals at this time.       Lizbeth Sprague, LCSW

## 2024-05-27 ENCOUNTER — Encounter

## 2024-05-27 ENCOUNTER — Telehealth: Payer: Self-pay | Admitting: Nurse Practitioner

## 2024-05-28 ENCOUNTER — Telehealth: Payer: Self-pay | Admitting: Oncology

## 2024-05-28 ENCOUNTER — Encounter: Payer: Self-pay | Admitting: *Deleted

## 2024-05-28 NOTE — Telephone Encounter (Signed)
 Called to ask about appointments.

## 2024-05-28 NOTE — Progress Notes (Signed)
 Pt called to discuss a sudden pain that occurred last night across lower back. No fever, no problem with urinating or with bowel movements. Stated that she took 1 Vicodin and that pain was relieved. She wanted to review other options of pain relief if it returns as she doesn't like to take pain medicine. We discussed monitoring bowel movements and urination and to call back to cancer center if she has return of pain not relieved by pain medicine or if she develops fever. We reviewed her upcoming appt on Tuesday. She verbalized understanding

## 2024-05-31 ENCOUNTER — Other Ambulatory Visit: Payer: Self-pay | Admitting: Oncology

## 2024-05-31 DIAGNOSIS — C179 Malignant neoplasm of small intestine, unspecified: Secondary | ICD-10-CM

## 2024-06-01 ENCOUNTER — Inpatient Hospital Stay

## 2024-06-01 ENCOUNTER — Other Ambulatory Visit: Payer: Self-pay

## 2024-06-01 ENCOUNTER — Encounter: Payer: Self-pay | Admitting: Nurse Practitioner

## 2024-06-01 ENCOUNTER — Inpatient Hospital Stay (HOSPITAL_BASED_OUTPATIENT_CLINIC_OR_DEPARTMENT_OTHER): Admitting: Nurse Practitioner

## 2024-06-01 VITALS — BP 131/65 | HR 58 | Temp 97.6°F | Resp 18 | Ht 61.0 in | Wt 168.9 lb

## 2024-06-01 DIAGNOSIS — C179 Malignant neoplasm of small intestine, unspecified: Secondary | ICD-10-CM | POA: Diagnosis not present

## 2024-06-01 DIAGNOSIS — Z5111 Encounter for antineoplastic chemotherapy: Secondary | ICD-10-CM | POA: Diagnosis not present

## 2024-06-01 LAB — CMP (CANCER CENTER ONLY)
ALT: 38 U/L (ref 0–44)
AST: 42 U/L — ABNORMAL HIGH (ref 15–41)
Albumin: 4.2 g/dL (ref 3.5–5.0)
Alkaline Phosphatase: 156 U/L — ABNORMAL HIGH (ref 38–126)
Anion gap: 13 (ref 5–15)
BUN: 9 mg/dL (ref 8–23)
CO2: 22 mmol/L (ref 22–32)
Calcium: 10.1 mg/dL (ref 8.9–10.3)
Chloride: 109 mmol/L (ref 98–111)
Creatinine: 1.02 mg/dL — ABNORMAL HIGH (ref 0.44–1.00)
GFR, Estimated: 58 mL/min — ABNORMAL LOW (ref >60)
Glucose, Bld: 118 mg/dL — ABNORMAL HIGH (ref 70–99)
Potassium: 4.2 mmol/L (ref 3.5–5.1)
Sodium: 144 mmol/L (ref 135–145)
Total Bilirubin: 0.2 mg/dL (ref 0.0–1.2)
Total Protein: 7.2 g/dL (ref 6.5–8.1)

## 2024-06-01 LAB — CBC WITH DIFFERENTIAL (CANCER CENTER ONLY)
Abs Immature Granulocytes: 1.34 K/uL — ABNORMAL HIGH (ref 0.00–0.07)
Basophils Absolute: 0.1 K/uL (ref 0.0–0.1)
Basophils Relative: 1 %
Eosinophils Absolute: 0 K/uL (ref 0.0–0.5)
Eosinophils Relative: 0 %
HCT: 34.5 % — ABNORMAL LOW (ref 36.0–46.0)
Hemoglobin: 10.7 g/dL — ABNORMAL LOW (ref 12.0–15.0)
Immature Granulocytes: 9 %
Lymphocytes Relative: 22 %
Lymphs Abs: 3.3 K/uL (ref 0.7–4.0)
MCH: 26.4 pg (ref 26.0–34.0)
MCHC: 31 g/dL (ref 30.0–36.0)
MCV: 85 fL (ref 80.0–100.0)
Monocytes Absolute: 1.1 K/uL — ABNORMAL HIGH (ref 0.1–1.0)
Monocytes Relative: 8 %
Neutro Abs: 8.9 K/uL — ABNORMAL HIGH (ref 1.7–7.7)
Neutrophils Relative %: 60 %
Platelet Count: 189 K/uL (ref 150–400)
RBC: 4.06 MIL/uL (ref 3.87–5.11)
RDW: 15.9 % — ABNORMAL HIGH (ref 11.5–15.5)
WBC Count: 14.8 K/uL — ABNORMAL HIGH (ref 4.0–10.5)
nRBC: 1.2 % — ABNORMAL HIGH (ref 0.0–0.2)

## 2024-06-01 NOTE — Progress Notes (Addendum)
 Patient seen by Olam Ned NP today  Vitals are within treatment parameters:Yes   Labs are within treatment parameters: Yes   Treatment plan has been signed: Yes   Per physician team, Patient is ready for treatment and there are NO modifications to the treatment plan. Chemo will be on 06/02/24 and a CEA needs to drawn.

## 2024-06-01 NOTE — Progress Notes (Signed)
 [facility] OFFICE PROGRESS NOTE   Diagnosis: Small bowel carcinoma  INTERVAL HISTORY:   Elaine Simon returns as scheduled.  She completed cycle 2 FOLFOX 05/18/2024.  She denies nausea/vomiting.  No mouth sores.  She noted cracks at the corners of the lips, resolved over a few days.  She had loose stools on day 1, 2 episodes.  She took Imodium with good results.  A few days later she had another episode.  She wonders if the loose stools were diet related.  Cold sensitivity lasted about 10 days.  No persistent neuropathy symptoms.  No bone pain.  Objective:  Vital signs in last 24 hours:  Blood pressure 131/65, pulse (!) 58, temperature 97.6 F (36.4 C), temperature source Temporal, resp. rate 18, height 5' 1 (1.549 m), weight 168 lb 14.4 oz (76.6 kg), SpO2 99%.    HEENT: No thrush or ulcers. Resp: Lungs clear bilaterally. Cardio: Irregular, premature beats GI: Abdomen soft and nontender. Vascular: No leg edema. Skin: Palms without erythema. Port-A-Cath without erythema.  Lab Results:  Lab Results  Component Value Date   WBC 14.8 (H) 06/01/2024   HGB 10.7 (L) 06/01/2024   HCT 34.5 (L) 06/01/2024   MCV 85.0 06/01/2024   PLT 189 06/01/2024   NEUTROABS 8.9 (H) 06/01/2024    Imaging:  No results found.  Medications: I have reviewed the patient's current medications.  Assessment/Plan: Duodenal carcinoma 04/04/2024-CT abdomen/pelvis: Heterogenous partially exophytic mass in the posterior dome of the right liver, heterogenous low-attenuation mass in the descending duodenum with extraluminal extension 04/06/2024 upper endoscopy: Large mass in the second portion of the duodenum, completely obstructing, ulcerated.  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  Peripheral hypermetabolism in the posterior right hepatic lesion.  Several tiny bilateral pulmonary nodules measuring 3 to 5 mm, too small to characterize by PET.  Scattered foci of hypermetabolism along the length of the colon, nonspecific. Cycle 1 FOLFOX 04/27/2024 Treatment held 05/10/2024 due to neutropenia (ANC 0.7) Cycle 2 FOLFOX 05/18/2024, Udenyca  Cycle 3 FOLFOX 06/02/2024, Udenyca  Stage I left breast cancer 1995, status post lumpectomy and left axillary dissection, adjuvant chemotherapy, radiation, and a visa Hypertension Hypothyroidism Osteoarthritis Normocytic anemia July 2025 Anorexia/weight loss secondary to #1 Anxiety/depression Family history of prostate cancer  Disposition: Elaine Simon appears stable.  She tolerated cycle 2 FOLFOX well.  Plan to proceed with cycle 3 as scheduled 06/02/2024.  We reviewed the overall plan is to complete 4-5 cycles of FOLFOX prior to restaging CTs then determine next steps based on response to treatment.  CBC and chemistry panel reviewed.  Labs adequate for treatment.  Her daughter had questions regarding the liver biopsy.  She understands the biopsy was nondiagnostic for malignancy, but was suspicious due to the presence of mucin which is suggestive of a metastasis from the mucinous adenocarcinoma.  EKG completed in the office today.  She has PACs.  She will follow-up with her cardiologist.  She will return for follow-up and treatment in 2 weeks.  We are available to see her sooner if needed.  Plan/EKG reviewed with Dr. Cloretta.    Olam Ned ANP/GNP-BC   06/01/2024  12:53 PM

## 2024-06-01 NOTE — Addendum Note (Signed)
 Addended by: DEBBY OLAM POUR on: 06/01/2024 01:25 PM   Modules accepted: Orders

## 2024-06-02 ENCOUNTER — Inpatient Hospital Stay

## 2024-06-02 ENCOUNTER — Other Ambulatory Visit

## 2024-06-02 VITALS — BP 146/88 | HR 107 | Temp 97.9°F | Resp 18 | Ht 61.0 in | Wt 171.3 lb

## 2024-06-02 DIAGNOSIS — Z5111 Encounter for antineoplastic chemotherapy: Secondary | ICD-10-CM | POA: Diagnosis not present

## 2024-06-02 DIAGNOSIS — C179 Malignant neoplasm of small intestine, unspecified: Secondary | ICD-10-CM

## 2024-06-02 LAB — CEA (ACCESS): CEA (CHCC): 96.34 ng/mL — ABNORMAL HIGH (ref 0.00–5.00)

## 2024-06-02 MED ORDER — DEXAMETHASONE SODIUM PHOSPHATE 10 MG/ML IJ SOLN
10.0000 mg | Freq: Once | INTRAMUSCULAR | Status: AC
  Administered 2024-06-02: 10 mg via INTRAVENOUS
  Filled 2024-06-02: qty 1

## 2024-06-02 MED ORDER — FLUOROURACIL CHEMO INJECTION 2.5 GM/50ML
400.0000 mg/m2 | Freq: Once | INTRAVENOUS | Status: AC
  Administered 2024-06-02: 750 mg via INTRAVENOUS
  Filled 2024-06-02: qty 15

## 2024-06-02 MED ORDER — LEUCOVORIN CALCIUM INJECTION 350 MG
400.0000 mg/m2 | Freq: Once | INTRAVENOUS | Status: AC
  Administered 2024-06-02: 736 mg via INTRAVENOUS
  Filled 2024-06-02: qty 36.8

## 2024-06-02 MED ORDER — DEXTROSE 5 % IV SOLN: INTRAVENOUS | Status: DC

## 2024-06-02 MED ORDER — SODIUM CHLORIDE 0.9 % IV SOLN
2400.0000 mg/m2 | INTRAVENOUS | Status: DC
  Administered 2024-06-02: 4400 mg via INTRAVENOUS
  Filled 2024-06-02: qty 88

## 2024-06-02 MED ORDER — OXALIPLATIN CHEMO INJECTION 100 MG/20ML
85.0000 mg/m2 | Freq: Once | INTRAVENOUS | Status: AC
  Administered 2024-06-02: 150 mg via INTRAVENOUS
  Filled 2024-06-02: qty 20

## 2024-06-02 MED ORDER — PALONOSETRON HCL INJECTION 0.25 MG/5ML
0.2500 mg | Freq: Once | INTRAVENOUS | Status: AC
  Administered 2024-06-02: 0.25 mg via INTRAVENOUS
  Filled 2024-06-02: qty 5

## 2024-06-02 NOTE — Patient Instructions (Signed)
 [facility] - A DEPT OF [facility] [place]  Discharge Instructions: Thank you for choosing [facility] to provide your oncology and hematology care.   If you have a lab appointment with the Cancer Center, please go directly to the Cancer Center and check in at the registration area.   Wear comfortable clothing and clothing appropriate for easy access to any Portacath or PICC line.   We strive to give you quality time with your provider. You may need to reschedule your appointment if you arrive late (15 or more minutes).  Arriving late affects you and other patients whose appointments are after yours.  Also, if you miss three or more appointments without notifying the office, you may be dismissed from the clinic at the provider's discretion.      For prescription refill requests, have your pharmacy contact our office and allow 72 hours for refills to be completed.    Today you received the following chemotherapy and/or immunotherapy agents: Oxaliplatin , Leucovorin , Fluorouracil  (5-FU)      To help prevent nausea and vomiting after your treatment, we encourage you to take your nausea medication as directed.  BELOW ARE SYMPTOMS THAT SHOULD BE REPORTED IMMEDIATELY: *FEVER GREATER THAN 100.4 F (38 C) OR HIGHER *CHILLS OR SWEATING *NAUSEA AND VOMITING THAT IS NOT CONTROLLED WITH YOUR NAUSEA MEDICATION *UNUSUAL SHORTNESS OF BREATH *UNUSUAL BRUISING OR BLEEDING *URINARY PROBLEMS (pain or burning when urinating, or frequent urination) *BOWEL PROBLEMS (unusual diarrhea, constipation, pain near the anus) TENDERNESS IN MOUTH AND THROAT WITH OR WITHOUT PRESENCE OF ULCERS (sore throat, sores in mouth, or a toothache) UNUSUAL RASH, SWELLING OR PAIN  UNUSUAL VAGINAL DISCHARGE OR ITCHING   Items with * indicate a potential emergency and should be followed up as soon as possible or go to the Emergency Department if any problems should occur.  Please show the  CHEMOTHERAPY ALERT CARD or IMMUNOTHERAPY ALERT CARD at check-in to the Emergency Department and triage nurse.  Should you have questions after your visit or need to cancel or reschedule your appointment, please contact [facility] - A DEPT OF [facility]  Dept: [phone]  and follow the prompts.  Office hours are 8:00 a.m. to 4:30 p.m. Monday - Friday. Please note that voicemails left after 4:00 p.m. may not be returned until the following business day.  We are closed weekends and major holidays. You have access to a nurse at all times for urgent questions. Please call the main number to the clinic Dept: [phone] and follow the prompts.   For any non-urgent questions, you may also contact your provider using MyChart. We now offer e-Visits for anyone 82 and older to request care online for non-urgent symptoms. For details visit [email or web address].   Also download the MyChart app! Go to the app store, search MyChart, open the app, select [place], and log in with your MyChart username and password.  Oxaliplatin  Injection What is this medication? OXALIPLATIN  (ox AL i PLA tin) treats colorectal cancer. It works by slowing down the growth of cancer cells. This medicine may be used for other purposes; ask your health care provider or pharmacist if you have questions. COMMON BRAND NAME(S): Eloxatin  What should I tell my care team before I take this medication? They need to know if you have any of these conditions: Heart disease History of irregular heartbeat or rhythm Liver disease Low blood cell levels (white cells, red cells, and platelets) Lung or  breathing disease, such as asthma Take medications that treat or prevent blood clots Tingling of the fingers, toes, or other nerve disorder An unusual or allergic reaction to oxaliplatin , other medications, foods, dyes, or preservatives If you or your partner are pregnant or trying to get  pregnant Breast-feeding How should I use this medication? This medication is injected into a vein. It is given by your care team in a hospital or clinic setting. Talk to your care team about the use of this medication in children. Special care may be needed. Overdosage: If you think you have taken too much of this medicine contact a poison control center or emergency room at once. NOTE: This medicine is only for you. Do not share this medicine with others. What if I miss a dose? Keep appointments for follow-up doses. It is important not to miss a dose. Call your care team if you are unable to keep an appointment. What may interact with this medication? Do not take this medication with any of the following: Cisapride Dronedarone Pimozide Thioridazine This medication may also interact with the following: Aspirin  and aspirin -like medications Certain medications that treat or prevent blood clots, such as warfarin, apixaban, dabigatran, and rivaroxaban Cisplatin Cyclosporine Diuretics Medications for infection, such as acyclovir, adefovir, amphotericin B, bacitracin , cidofovir, foscarnet, ganciclovir, gentamicin, pentamidine, vancomycin NSAIDs, medications for pain and inflammation, such as ibuprofen or naproxen  Other medications that cause heart rhythm changes Pamidronate Zoledronic acid This list may not describe all possible interactions. Give your health care provider a list of all the medicines, herbs, non-prescription drugs, or dietary supplements you use. Also tell them if you smoke, drink alcohol, or use illegal drugs. Some items may interact with your medicine. What should I watch for while using this medication? Your condition will be monitored carefully while you are receiving this medication. You may need blood work while taking this medication. This medication may make you feel generally unwell. This is not uncommon as chemotherapy can affect healthy cells as well as cancer  cells. Report any side effects. Continue your course of treatment even though you feel ill unless your care team tells you to stop. This medication may increase your risk of getting an infection. Call your care team for advice if you get a fever, chills, sore throat, or other symptoms of a cold or flu. Do not treat yourself. Try to avoid being around people who are sick. Avoid taking medications that contain aspirin , acetaminophen , ibuprofen, naproxen , or ketoprofen unless instructed by your care team. These medications may hide a fever. Be careful brushing or flossing your teeth or using a toothpick because you may get an infection or bleed more easily. If you have any dental work done, tell your dentist you are receiving this medication. This medication can make you more sensitive to cold. Do not drink cold drinks or use ice. Cover exposed skin before coming in contact with cold temperatures or cold objects. When out in cold weather wear warm clothing and cover your mouth and nose to warm the air that goes into your lungs. Tell your care team if you get sensitive to the cold. Talk to your care team if you or your partner are pregnant or think either of you might be pregnant. This medication can cause serious birth defects if taken during pregnancy and for 9 months after the last dose. A negative pregnancy test is required before starting this medication. A reliable form of contraception is recommended while taking this medication and for 9  months after the last dose. Talk to your care team about effective forms of contraception. Do not father a child while taking this medication and for 6 months after the last dose. Use a condom while having sex during this time period. Do not breastfeed while taking this medication and for 3 months after the last dose. This medication may cause infertility. Talk to your care team if you are concerned about your fertility. What side effects may I notice from receiving this  medication? Side effects that you should report to your care team as soon as possible: Allergic reactions--skin rash, itching, hives, swelling of the face, lips, tongue, or throat Bleeding--bloody or black, tar-like stools, vomiting blood or brown material that looks like coffee grounds, red or dark brown urine, small red or purple spots on skin, unusual bruising or bleeding Dry cough, shortness of breath or trouble breathing Heart rhythm changes--fast or irregular heartbeat, dizziness, feeling faint or lightheaded, chest pain, trouble breathing Infection--fever, chills, cough, sore throat, wounds that don't heal, pain or trouble when passing urine, general feeling of discomfort or being unwell Liver injury--right upper belly pain, loss of appetite, nausea, light-colored stool, dark yellow or brown urine, yellowing skin or eyes, unusual weakness or fatigue Low red blood cell level--unusual weakness or fatigue, dizziness, headache, trouble breathing Muscle injury--unusual weakness or fatigue, muscle pain, dark yellow or brown urine, decrease in amount of urine Pain, tingling, or numbness in the hands or feet Sudden and severe headache, confusion, change in vision, seizures, which may be signs of posterior reversible encephalopathy syndrome (PRES) Unusual bruising or bleeding Side effects that usually do not require medical attention (report to your care team if they continue or are bothersome): Diarrhea Nausea Pain, redness, or swelling with sores inside the mouth or throat Unusual weakness or fatigue Vomiting This list may not describe all possible side effects. Call your doctor for medical advice about side effects. You may report side effects to FDA at 1-800-FDA-1088. Where should I keep my medication? This medication is given in a hospital or clinic. It will not be stored at home. NOTE: This sheet is a summary. It may not cover all possible information. If you have questions about this  medicine, talk to your doctor, pharmacist, or health care provider.  2024 Elsevier/Gold Standard (2023-08-15 00:00:00)  Leucovorin  Injection What is this medication? LEUCOVORIN  (loo koe VOR in) prevents side effects from certain medications, such as methotrexate. It works by increasing folate levels. This helps protect healthy cells in your body. It may also be used to treat anemia caused by low levels of folate. It can also be used with fluorouracil , a type of chemotherapy, to treat colorectal cancer. It works by increasing the effects of fluorouracil  in the body. This medicine may be used for other purposes; ask your health care provider or pharmacist if you have questions. What should I tell my care team before I take this medication? They need to know if you have any of these conditions: Anemia from low levels of vitamin B12 in the blood An unusual or allergic reaction to leucovorin , folic acid, other medications, foods, dyes, or preservatives Pregnant or trying to get pregnant Breastfeeding How should I use this medication? This medication is injected into a vein or a muscle. It is given by your care team in a hospital or clinic setting. Talk to your care team about the use of this medication in children. Special care may be needed. Overdosage: If you think you have taken too  much of this medicine contact a poison control center or emergency room at once. NOTE: This medicine is only for you. Do not share this medicine with others. What if I miss a dose? Keep appointments for follow-up doses. It is important not to miss your dose. Call your care team if you are unable to keep an appointment. What may interact with this medication? Capecitabine Fluorouracil  Phenobarbital Phenytoin Primidone Trimethoprim;sulfamethoxazole This list may not describe all possible interactions. Give your health care provider a list of all the medicines, herbs, non-prescription drugs, or dietary supplements  you use. Also tell them if you smoke, drink alcohol, or use illegal drugs. Some items may interact with your medicine. What should I watch for while using this medication? Your condition will be monitored carefully while you are receiving this medication. This medication may increase the side effects of 5-fluorouracil . Tell your care team if you have diarrhea or mouth sores that do not get better or that get worse. What side effects may I notice from receiving this medication? Side effects that you should report to your care team as soon as possible: Allergic reactions--skin rash, itching, hives, swelling of the face, lips, tongue, or throat This list may not describe all possible side effects. Call your doctor for medical advice about side effects. You may report side effects to FDA at 1-800-FDA-1088. Where should I keep my medication? This medication is given in a hospital or clinic. It will not be stored at home. NOTE: This sheet is a summary. It may not cover all possible information. If you have questions about this medicine, talk to your doctor, pharmacist, or health care provider.  2024 Elsevier/Gold Standard (2022-02-05 00:00:00)  Fluorouracil  Injection What is this medication? FLUOROURACIL  (flure oh YOOR a sil) treats some types of cancer. It works by slowing down the growth of cancer cells. This medicine may be used for other purposes; ask your health care provider or pharmacist if you have questions. COMMON BRAND NAME(S): Adrucil  What should I tell my care team before I take this medication? They need to know if you have any of these conditions: Blood disorders Dihydropyrimidine dehydrogenase (DPD) deficiency Infection, such as chickenpox, cold sores, herpes Kidney disease Liver disease Poor nutrition Recent or ongoing radiation therapy An unusual or allergic reaction to fluorouracil , other medications, foods, dyes, or preservatives If you or your partner are pregnant or  trying to get pregnant Breast-feeding How should I use this medication? This medication is injected into a vein. It is administered by your care team in a hospital or clinic setting. Talk to your care team about the use of this medication in children. Special care may be needed. Overdosage: If you think you have taken too much of this medicine contact a poison control center or emergency room at once. NOTE: This medicine is only for you. Do not share this medicine with others. What if I miss a dose? Keep appointments for follow-up doses. It is important not to miss your dose. Call your care team if you are unable to keep an appointment. What may interact with this medication? Do not take this medication with any of the following: Live virus vaccines This medication may also interact with the following: Medications that treat or prevent blood clots, such as warfarin, enoxaparin, dalteparin This list may not describe all possible interactions. Give your health care provider a list of all the medicines, herbs, non-prescription drugs, or dietary supplements you use. Also tell them if you smoke, drink alcohol, or  use illegal drugs. Some items may interact with your medicine. What should I watch for while using this medication? Your condition will be monitored carefully while you are receiving this medication. This medication may make you feel generally unwell. This is not uncommon as chemotherapy can affect healthy cells as well as cancer cells. Report any side effects. Continue your course of treatment even though you feel ill unless your care team tells you to stop. In some cases, you may be given additional medications to help with side effects. Follow all directions for their use. This medication may increase your risk of getting an infection. Call your care team for advice if you get a fever, chills, sore throat, or other symptoms of a cold or flu. Do not treat yourself. Try to avoid being around  people who are sick. This medication may increase your risk to bruise or bleed. Call your care team if you notice any unusual bleeding. Be careful brushing or flossing your teeth or using a toothpick because you may get an infection or bleed more easily. If you have any dental work done, tell your dentist you are receiving this medication. Avoid taking medications that contain aspirin , acetaminophen , ibuprofen, naproxen , or ketoprofen unless instructed by your care team. These medications may hide a fever. Do not treat diarrhea with over the counter products. Contact your care team if you have diarrhea that lasts more than 2 days or if it is severe and watery. This medication can make you more sensitive to the sun. Keep out of the sun. If you cannot avoid being in the sun, wear protective clothing and sunscreen. Do not use sun lamps, tanning beds, or tanning booths. Talk to your care team if you or your partner wish to become pregnant or think you might be pregnant. This medication can cause serious birth defects if taken during pregnancy and for 3 months after the last dose. A reliable form of contraception is recommended while taking this medication and for 3 months after the last dose. Talk to your care team about effective forms of contraception. Do not father a child while taking this medication and for 3 months after the last dose. Use a condom while having sex during this time period. Do not breastfeed while taking this medication. This medication may cause infertility. Talk to your care team if you are concerned about your fertility. What side effects may I notice from receiving this medication? Side effects that you should report to your care team as soon as possible: Allergic reactions--skin rash, itching, hives, swelling of the face, lips, tongue, or throat Heart attack--pain or tightness in the chest, shoulders, arms, or jaw, nausea, shortness of breath, cold or clammy skin, feeling faint or  lightheaded Heart failure--shortness of breath, swelling of the ankles, feet, or hands, sudden weight gain, unusual weakness or fatigue Heart rhythm changes--fast or irregular heartbeat, dizziness, feeling faint or lightheaded, chest pain, trouble breathing High ammonia level--unusual weakness or fatigue, confusion, loss of appetite, nausea, vomiting, seizures Infection--fever, chills, cough, sore throat, wounds that don't heal, pain or trouble when passing urine, general feeling of discomfort or being unwell Low red blood cell level--unusual weakness or fatigue, dizziness, headache, trouble breathing Pain, tingling, or numbness in the hands or feet, muscle weakness, change in vision, confusion or trouble speaking, loss of balance or coordination, trouble walking, seizures Redness, swelling, and blistering of the skin over hands and feet Severe or prolonged diarrhea Unusual bruising or bleeding Side effects that usually do not  require medical attention (report to your care team if they continue or are bothersome): Dry skin Headache Increased tears Nausea Pain, redness, or swelling with sores inside the mouth or throat Sensitivity to light Vomiting This list may not describe all possible side effects. Call your doctor for medical advice about side effects. You may report side effects to FDA at 1-800-FDA-1088. Where should I keep my medication? This medication is given in a hospital or clinic. It will not be stored at home. NOTE: This sheet is a summary. It may not cover all possible information. If you have questions about this medicine, talk to your doctor, pharmacist, or health care provider.  2024 Elsevier/Gold Standard (2022-01-08 00:00:00)

## 2024-06-03 ENCOUNTER — Encounter: Payer: Self-pay | Admitting: General Practice

## 2024-06-03 ENCOUNTER — Telehealth: Payer: Self-pay | Admitting: Nurse Practitioner

## 2024-06-03 NOTE — Progress Notes (Signed)
 [facility] Spiritual Care Note  Ms Elaine Simon was in upbeat, grateful spirits on this follow-up call, citing better energy today and ability to be out of bed and participating in life more than she has recently.  Forwarded her medical questions about brain fog and medication regimen to Dr Cloretta and Olam Adas.   Provided reflective listening and encouragement. We plan to continue phone check-ins for spiritual and emotional support.  [address] Olam Corrigan, [place], [facility] Pager [phone] Voicemail [phone]

## 2024-06-04 ENCOUNTER — Encounter: Payer: Self-pay | Admitting: Oncology

## 2024-06-04 ENCOUNTER — Ambulatory Visit: Attending: Physician Assistant | Admitting: Physician Assistant

## 2024-06-04 ENCOUNTER — Telehealth: Payer: Self-pay | Admitting: *Deleted

## 2024-06-04 ENCOUNTER — Ambulatory Visit

## 2024-06-04 ENCOUNTER — Encounter: Payer: Self-pay | Admitting: Physician Assistant

## 2024-06-04 VITALS — BP 100/63 | HR 83 | Temp 98.2°F | Resp 18

## 2024-06-04 VITALS — BP 100/68 | HR 45 | Ht 62.0 in | Wt 170.2 lb

## 2024-06-04 DIAGNOSIS — C179 Malignant neoplasm of small intestine, unspecified: Secondary | ICD-10-CM

## 2024-06-04 DIAGNOSIS — I493 Ventricular premature depolarization: Secondary | ICD-10-CM

## 2024-06-04 DIAGNOSIS — Z5111 Encounter for antineoplastic chemotherapy: Secondary | ICD-10-CM | POA: Diagnosis not present

## 2024-06-04 DIAGNOSIS — E782 Mixed hyperlipidemia: Secondary | ICD-10-CM | POA: Diagnosis not present

## 2024-06-04 DIAGNOSIS — I2510 Atherosclerotic heart disease of native coronary artery without angina pectoris: Secondary | ICD-10-CM | POA: Diagnosis not present

## 2024-06-04 DIAGNOSIS — I10 Essential (primary) hypertension: Secondary | ICD-10-CM | POA: Diagnosis not present

## 2024-06-04 MED ORDER — DILTIAZEM HCL ER COATED BEADS 120 MG PO CP24: ORAL_CAPSULE | ORAL | 3 refills | Status: AC

## 2024-06-04 MED ORDER — PEGFILGRASTIM-CBQV 6 MG/0.6ML ~~LOC~~ SOSY
6.0000 mg | PREFILLED_SYRINGE | Freq: Once | SUBCUTANEOUS | Status: AC
  Administered 2024-06-04: 6 mg via SUBCUTANEOUS
  Filled 2024-06-04: qty 0.6

## 2024-06-04 NOTE — Telephone Encounter (Signed)
 Called patient to f/u on her complaint of brain fog and word finding problems that were reported by chaplain yesterday. No answer and voice mail was full.

## 2024-06-04 NOTE — Patient Instructions (Signed)

## 2024-06-04 NOTE — Progress Notes (Signed)
 Cardiology Office Note   Date:  06/04/2024  ID:  Elaine Simon, DOB 15-Sep-1951, MRN [id number] PCP: Shelda Atlas, MD  [facility] Providers Cardiologist:  Dub Huntsman, DO     History of Present Illness Elaine Simon is a 73 y.o. 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 Cloretta and hypothyroidism.  Coronary CTA obtained on 04/12/2020 showed minimal less than 25% disease in proximal LAD and OM, calcium  score was 30.2 which placed the patient at 69th percentile for age and sex matched control.  Echocardiogram obtained on 08/06/2023 showed EF 60 to 65%, no regional wall motion abnormality.  Heart monitor obtained in December 2024 for dizziness showed minimal heart rate of 49, maximal heart rate of 250, average heart rate is 75, one run of ventricular tachycardia lasting 6 beats with maximal heart rate of 250 bpm, 14 episode of SVT, longest lasting 36 minutes, PVC burden less than 1%.  CT of abdomen and pelvis obtained on 04/04/2024 showed a liver mass measuring 5.7 x 6.2 x 4.5 cm. Patient also found to have a 3.4 x 3.4 x 6.9 cm mass in the descending duodenum.  Subsequent upper endoscopy procedure showed a large mass in the second portion of the duodenum, completely obstructing, ulcerated.  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  PET scan in August showed marked hypermetabolic duodenal lesion was hypermetabolic lymph nodes in the hepatoduodenal ligament, peripheral hypermetabolism in the posterior right hepatic lesion.  She is being followed closely by hematology/oncology service and has underwent 2 cycles of chemotherapy.  Patient presents today for follow-up.  Initial heart rate was recorded as 45 bpm.   While listening to her heart, it sounds like her heart was in trigeminy.  We obtained an EKG that indicated the patient was in bigeminy.  Previous heart monitor from late last year indicated that her PVC burden was less than 1% of time, I suspect  increased PVC was related to chemotherapy.  She is also having low blood pressures with chemotherapy as well.  I asked her to only take Cardizem  if her systolic blood pressures greater than 110 mmHg.  She says she has not been taking Cardizem  consistently and has only been taking it about once a week.  I also asked her to restart on rosuvastatin.  She does not wish to take aspirin  which I am fine with since she does not have significant coronary artery disease.  I recommended an echocardiogram around November to make sure her ejection fraction is normal despite the PVCs.  If there is any change in the ejection fraction, may need to consider amiodarone for PVC suppression as I do not think her blood pressure will tolerate higher dose of calcium  channel blocker.   ROS:   She denies chest pain, palpitations, dyspnea, pnd, orthopnea, n, v, dizziness, syncope, edema, weight gain, or early satiety. All other systems reviewed and are otherwise negative except as noted above.    Studies Reviewed EKG Interpretation Date/Time:  Friday June 04 2024 12:09:57 EDT Ventricular Rate:  77 PR Interval:  130 QRS Duration:  84 QT Interval:  432 QTC Calculation: 488 R Axis:   -53  Text Interpretation: Sinus rhythm with frequent Premature ventricular complexes in a pattern of bigeminy Left anterior fascicular block Possible Anterolateral infarct (  cited on or before 01-Jun-2024) When compared with ECG of 01-Jun-2024 12:20, Premature ventricular complexes are now Present Premature atrial complexes are no longer Present Questionable change in initial forces of Lateral leads Confirmed by Janene Boer [id number]) on 06/04/2024 12:12:15 PM    Cardiac Studies & Procedures    ______________________________________________________________________________________________     ECHOCARDIOGRAM  ECHOCARDIOGRAM COMPLETE 08/06/2023  Narrative ECHOCARDIOGRAM REPORT    Patient Name:   Elaine Simon Date of Exam: 08/06/2023 Medical Rec #:  [id number]             Height:       62.0 in Accession #:    [id number]            Weight:       176.0 lb Date of Birth:  1951/09/12             BSA:          1.811 m Patient Age:    72 years              BP:           124/88 mmHg Patient Gender: F                     HR:           76 bpm. Exam Location:  [address]  Procedure: 2D Echo, 3D Echo, Cardiac Doppler, Color Doppler and Strain Analysis  Indications:    R06.09 DOE  History:        Patient has prior history of Echocardiogram examinations, most recent 11/01/2020. Hx of Breast cancer; Risk Factors:Hypertension and HLD.  Sonographer:    Waldo Guadalajara RCS Referring Phys: [id number] KARDIE TOBB  IMPRESSIONS   1. Left ventricular ejection fraction, by estimation, is 60 to 65%. The left ventricle has normal function. The left ventricle has no regional wall motion abnormalities. Left ventricular diastolic parameters were normal. GLS -18.4% 2. Right ventricular systolic function is normal. The right ventricular size is normal. There is normal pulmonary artery systolic pressure. 3. The mitral valve is normal in structure. No evidence of mitral valve regurgitation. No evidence of mitral stenosis. 4. The aortic valve is normal in structure. Aortic valve regurgitation is not visualized. No aortic stenosis is present. 5. The inferior vena cava is normal in size with greater than 50% respiratory variability, suggesting right atrial pressure of 3 mmHg.  FINDINGS Left Ventricle: Left ventricular ejection fraction, by estimation, is 60 to 65%. The left ventricle has normal function. The left ventricle has no regional wall motion abnormalities. The left ventricular internal  cavity size was normal in size. There is no left ventricular hypertrophy. Left ventricular diastolic parameters were normal.  Right Ventricle: The right ventricular size is normal. No increase in right ventricular wall thickness. Right ventricular systolic function is normal. There is normal pulmonary artery systolic pressure. The tricuspid regurgitant velocity is 2.43 m/s, and with an assumed right atrial pressure of 3 mmHg, the estimated right ventricular systolic pressure is 26.6 mmHg.  Left Atrium: Left atrial size was normal in size.  Right Atrium: Right atrial size was normal in size.  Pericardium: There is no evidence of pericardial effusion.  Mitral Valve: The mitral valve is normal in structure. No evidence of mitral valve regurgitation. No evidence of mitral valve stenosis.  Tricuspid Valve: The tricuspid valve is normal in structure. Tricuspid valve regurgitation is not demonstrated. No evidence of tricuspid stenosis.  Aortic Valve: The aortic valve is normal  in structure. Aortic valve regurgitation is not visualized. No aortic stenosis is present.  Pulmonic Valve: The pulmonic valve was normal in structure. Pulmonic valve regurgitation is not visualized. No evidence of pulmonic stenosis.  Aorta: The aortic root is normal in size and structure.  Venous: The inferior vena cava is normal in size with greater than 50% respiratory variability, suggesting right atrial pressure of 3 mmHg.  IAS/Shunts: No atrial level shunt detected by color flow Doppler.   LEFT VENTRICLE PLAX 2D LVIDd:         3.70 cm   Diastology LVIDs:         2.40 cm   LV e' medial:    10.10 cm/s LV PW:         1.10 cm   LV E/e' medial:  7.6 LV IVS:        0.90 cm   LV e' lateral:   7.07 cm/s LVOT diam:     1.60 cm   LV E/e' lateral: 10.8 LV SV:         45 LV SV Index:   25        2D Longitudinal Strain LVOT Area:     2.01 cm  2D Strain GLS (A2C):   -19.0 % 2D Strain GLS (A3C):   -17.4 % 2D Strain GLS  (A4C):   -18.8 % 2D Strain GLS Avg:     -18.4 %  3D Volume EF: 3D EF:        62 % LV EDV:       79 ml LV ESV:       30 ml LV SV:        49 ml  RIGHT VENTRICLE RV Basal diam:  2.70 cm RV S prime:     11.00 cm/s TAPSE (M-mode): 2.1 cm RVSP:           26.6 mmHg  LEFT ATRIUM             Index        RIGHT ATRIUM           Index LA diam:        2.70 cm 1.49 cm/m   RA Pressure: 3.00 mmHg LA Vol (A2C):   24.7 ml 13.64 ml/m  RA Area:     9.05 cm LA Vol (A4C):   32.6 ml 18.01 ml/m  RA Volume:   17.20 ml  9.50 ml/m LA Biplane Vol: 28.3 ml 15.63 ml/m AORTIC VALVE LVOT Vmax:   108.00 cm/s LVOT Vmean:  83.600 cm/s LVOT VTI:    0.222 m  AORTA Ao Root diam: 3.10 cm Ao Asc diam:  3.20 cm  MITRAL VALVE               TRICUSPID VALVE MV Area (PHT):             TR Peak grad:   23.6 mmHg MV Decel Time:             TR Vmax:        243.00 cm/s MV E velocity: 76.50 cm/s  Estimated RAP:  3.00 mmHg MV A velocity: 99.00 cm/s  RVSP:           26.6 mmHg MV E/A ratio:  0.77 SHUNTS Systemic VTI:  0.22 m Systemic Diam: 1.60 cm  Aditya Sabharwal Electronically signed by Ria Commander Signature Date/Time: 08/06/2023/5:02:08 PM    Final    MONITORS  LONG TERM MONITOR (3-14 DAYS) 09/01/2023  Narrative Patch Wear  Time:  13 days and 22 hours (2024-11-20T15:50:34-0500 to 2024-12-04T14:00:57-0500)  Patient had a min HR of 49 bpm, max HR of 250 bpm, and avg HR of 75 bpm. Predominant underlying rhythm was Sinus Rhythm.  1 run of Ventricular Tachycardia occurred lasting 6 beats with a max rate of 250 bpm (avg 203 bpm). 14 Supraventricular Tachycardia runs occurred, the run with the fastest interval lasting 6 mins 22 secs with a max rate of 200 bpm, the longest lasting 36 mins 13 secs with an avg rate of 117 bpm. Isolated SVEs were rare (<1.0%), SVE Couplets were rare (<1.0%), and SVE Triplets were rare (<1.0%). Isolated VEs were rare (<1.0%), VE Couplets were rare (<1.0%), and no VE Triplets  were present. Ventricular Trigeminy was present.  Symptoms associated with sinus tachycardia.  Conclusion- this study shows evidence of the following: 1. Nonsustained ventricular tachycardia 2. Paroxysmal supraventricular tachycardia   CT SCANS  CT CORONARY MORPH W/CTA COR W/SCORE 04/12/2020  Addendum 04/12/2020  6:02 PM ADDENDUM REPORT: 04/12/2020 18:00  CLINICAL DATA:  73 yo female with exertional dyspnea  EXAM: Cardiac/Coronary  CT  TECHNIQUE: The patient was scanned on a [employer].  FINDINGS: A 120 kV prospective scan was triggered in the descending thoracic aorta at 111 HU's. Axial non-contrast 3 mm slices were carried out through the heart. The data set was analyzed on a dedicated work station and scored using the Agatson method. Gantry rotation speed was 250 msecs and collimation was .6 mm. No beta blockade and 0.8 mg of sl NTG was given. The 3D data set was reconstructed in 5% intervals of the 67-82 % of the R-R cycle. Diastolic phases were analyzed on a dedicated work station using MPR, MIP and VRT modes. The patient received 80 cc of contrast.  Aorta:  Normal size.  Mild atherosclerosis.  No dissection.  Aortic Valve:  Trileaflet.  No calcifications.  Coronary Arteries:  Normal coronary origin.  Right dominance.  RCA is a large dominant artery that gives rise to PDA. There is no plaque.  Left main is a large artery that gives rise to LAD and LCX arteries.  LAD is a large vessel that gives rise to 4 very small diagonals; there is minimal (0-24%) calcified stenosis in the proximial vessel.  LCX is a non-dominant artery that gives rise to one large branching OM branch. There is minimal (0-24%) calcified plaque at the takeoff of OM.  Other findings:  Normal pulmonary vein drainage into the left atrium.  Normal let atrial appendage without a thrombus.  Normal size of the pulmonary artery.  IMPRESSION: 1. Coronary calcium  score of 30.2. This  was 84 percentile for age and sex matched control.  2. Normal coronary origin with right dominance.  3. Minimal (0-24%) nonobstructive CAD as outlined above; CAD-RADS 1.  Redell Shallow   Electronically Signed By: Redell Shallow M.D. On: 04/12/2020 18:00  Narrative EXAM: OVER-READ INTERPRETATION  CT CHEST  The following report is an over-read performed by radiologist Dr. Franky Crease of [facility] on 04/12/2020. This over-read does not include interpretation of cardiac or coronary anatomy or pathology. The coronary CTA interpretation by the cardiologist is attached.  COMPARISON:  None.  FINDINGS: Vascular: Heart is normal size.  Aorta normal caliber.  Mediastinum/Nodes: No adenopathy.  Lungs/Pleura: No confluent opacities or effusions.  Upper Abdomen: Imaging into the upper abdomen shows no acute findings.  Musculoskeletal: Chest wall soft tissues are unremarkable. No acute bony abnormality.  IMPRESSION: No acute or  significant extracardiac abnormality.  Electronically Signed: By: Franky Crease M.D. On: 04/12/2020 13:27     ______________________________________________________________________________________________      Risk Assessment/Calculations           Physical Exam VS:  BP 100/68   Pulse (!) 45   Ht 5' 2 (1.575 m)   Wt 170 lb 3.2 oz (77.2 kg)   SpO2 100%   BMI 31.13 kg/m        Wt Readings from Last 3 Encounters:  06/04/24 170 lb 3.2 oz (77.2 kg)  06/02/24 171 lb 4.8 oz (77.7 kg)  06/01/24 168 lb 14.4 oz (76.6 kg)    GEN: Well nourished, well developed in no acute distress NECK: No JVD; No carotid bruits CARDIAC: RRR, no murmurs, rubs, gallops RESPIRATORY:  Clear to auscultation without rales, wheezing or rhonchi  ABDOMEN: Soft, non-tender, non-distended EXTREMITIES:  No edema; No deformity   ASSESSMENT AND PLAN  Frequent PVCs: Her heart rate was initially 45 on blood pressure check, however on physical exam, she  appears to be in trigeminy.  By the time EKG was done, EKG revealed bigeminy.  Her previous heart monitor late last year showed PVC burden was less than 1%.  I suspect the increased PVC burden is related to chemotherapy.  She is only taking Cardizem  sporadically due to borderline low blood pressure.  I asked her to take Cardizem  if her systolic blood pressures greater than 110 in the morning.  This will help to suppress the PVCs.  I will also obtain a repeat echocardiogram around November to see if there is any drop in the ejection fraction.  If EF drops, will need to consider amiodarone therapy for PVC suppression  CAD: History of nonobstructive CAD.  Denies any chest pain  Hypertension: Ever since she was started on chemotherapy, her blood pressure has been borderline low, she is only taking Cardizem  sporadically.  She required Cardizem  more so for frequent PVCs.  I urged her to take Cardizem  only if her morning blood pressures greater than 110 mmHg  Hyperlipidemia: Resume rosuvastatin.  Small bowel carcinoma: Recent CT of abdomen showed a mass in the descending portion of duodenum and also a liver mass as well.  EGD with biopsy confirmed adenocarcinoma.  She is being followed by oncology service and currently undergoing chemotherapy       Dispo: Follow-up with Dr. Sheena in 34-month if EF is okay on repeat echocardiogram  Signed, Scot Ford, PA

## 2024-06-04 NOTE — Patient Instructions (Signed)
 Medication Instructions:  Your physician has recommended you make the following change in your medication:  IF YOUR SYSTOLIC BLOOD PRESSURE (TOP NUMBER) IS GREATER THAN 110 TAKE THE DILTIAZEM .  IF YOUR SYSTOLIC BLOOD PRESSURE (TOP NUMBER) IS LESS THAN 110 DON'T TAKE THE DILTIAZEM   *If you need a refill on your cardiac medications before your next appointment, please call your pharmacy*  Lab Work: NONE If you have labs (blood work) drawn today and your tests are completely normal, you will receive your results only by: MyChart Message (if you have MyChart) OR A paper copy in the mail If you have any lab test that is abnormal or we need to change your treatment, we will call you to review the results.  Testing/Procedures: Your physician has requested that you have an echocardiogram. Echocardiography is a painless test that uses sound waves to create images of your heart. It provides your doctor with information about the size and shape of your heart and how well your heart's chambers and valves are working. This procedure takes approximately one hour. There are no restrictions for this procedure. TO BE DONE IN NOVEMBER  Please do NOT wear cologne, perfume, aftershave, or lotions (deodorant is allowed). Please arrive 15 minutes prior to your appointment time.  Please note: We ask at that you not bring children with you during ultrasound (echo/ vascular) testing. Due to room size and safety concerns, children are not allowed in the ultrasound rooms during exams. Our front office staff cannot provide observation of children in our lobby area while testing is being conducted. An adult accompanying a patient to their appointment will only be allowed in the ultrasound room at the discretion of the ultrasound technician under special circumstances. We apologize for any inconvenience.   Follow-Up: At [facility], you and your health needs are our priority.  As part of our continuing mission to  provide you with exceptional heart care, our providers are all part of one team.  This team includes your primary Cardiologist (physician) and Advanced Practice Providers or APPs (Physician Assistants and Nurse Practitioners) who all work together to provide you with the care you need, when you need it.  Your next appointment:   6 month(s)  Provider:   Kardie Tobb, DO

## 2024-06-05 ENCOUNTER — Other Ambulatory Visit: Payer: Self-pay

## 2024-06-07 ENCOUNTER — Encounter: Payer: Self-pay | Admitting: *Deleted

## 2024-06-07 ENCOUNTER — Encounter: Payer: Self-pay | Admitting: Oncology

## 2024-06-07 NOTE — Progress Notes (Signed)
 Pt attended 10/24/23 screening event where her BP was 133/79 and her A1c was 5.9. At the event pt noted having a PCP but did not document if she had insurance. Pt noted she was not an active smoker and also noted SDOH utility need. Chart review indicated that pt is indeed established with PCP Dr. Shelda. During 60 day event f/u,CHW contacted pt via phone call to inquire if pt was in need of insurance help and to f/u on SDOH utility need. Pt answered and stated that she does have insurance and that she had an on 04/12/2024 with Dr. Shelda and that she intended to address her A1C with her PCP at that time.  Pt also stated to CHW that she continued to struggle with her utility bills and that she also had food insecurity. CHW sent information on [employer] pantry's and out of the garden project to support food insecurity. Also, CHW sent [employer], Emergency Rental assistance program (ERAP) and [employer] Income [employer] Program [place]) information to support utility assistance needs. To support pt A1C CHW sent information to the Healthy Cooking class and [employer].   During 6 month event f/u, chart review indicates that pt has had a diagnosis of metastatic small bowel cancer and is receiving ongoing oncology care at [facility] facility since August, 2025, with second opinion from [facility], and has continued chemo at [facility] through September, 2025. Specifically, pt is receiving ongoing SDOH support from the oncology SW and RN navigators, and oncology notes are being forwarded to her PCP Dr. Jerel. She continues to see [facility] cardiology also. No additional health equity support indicated or scheduled at this time.

## 2024-06-09 NOTE — Progress Notes (Signed)
 [facility] CSW Progress Note  Clinical Social Work Intern contacted patient by phone with reminder of GI cancer support group meeting on 06/16/24. Pt and CSW Intern also discussed pt's high anxiety that day. CSW Intern offered empathetic listening and possible coping strategies such as setting boundaries, taking time to rest, deep breathing, and engaging in relaxing activities pt enjoys like puzzles and crosswords.    Interventions: Provided brief mental health counseling with regard to anxiety.       Follow Up Plan:  CSW will see patient on 06/11/24 for counseling.    Thersia KATHEE Daring Clinical Social Work Intern [employer]

## 2024-06-10 NOTE — Progress Notes (Signed)
 I reviewed patient visit with social work Tax inspector. I concur with the treatment plan as documented in the SW intern's note.   Analis Distler E Siaosi Alter, LCSW Clinical Child psychotherapist

## 2024-06-11 ENCOUNTER — Other Ambulatory Visit: Payer: Self-pay

## 2024-06-11 ENCOUNTER — Encounter: Payer: Self-pay | Admitting: Oncology

## 2024-06-11 ENCOUNTER — Inpatient Hospital Stay

## 2024-06-11 NOTE — Progress Notes (Signed)
 [facility] CSW Counseling Note  Patient was referred by self. Treatment type: Individual  CSW Lizbeth Sprague present with CSW Intern for counseling session.  Presenting Concerns: Patient and/or family reports the following symptoms/concerns: depression and stress Duration of problem: since diagnosis approx 2 months ago ; Severity of problem: mild   Orientation:oriented to person, place, and time/date.   Affect: Congruent Risk of harm to self or others: No plan to harm self or others  Patient and/or Family's Strengths/Protective Factors: Social connections, Social and Emotional competence, Concrete supports in place (healthy food, safe environments, etc.), Sense of purpose, and ability for insight.Active sense of humor  Average or above average intelligence  Capable of independent living  Communication skills  General fund of knowledge  Motivation for treatment/growth  Religious Affiliation  Supportive family/friends      Goals Addressed: Patient will:  Reduce symptoms of: depression and stress by identifying sources, helping pt to reframe negative self-talk, offering psycho education & skill building, and management of effects. Increase knowledge and/or ability of:  coping skills by identifying current coping skills used and adding additional options. In session, Intern helped pt define their needs and role play conversation for setting healthy boundaries. Intern normalized need for boundaries to change when dealing with a cancer diagnosis and going through tx.  Increase healthy adjustment to current life circumstances by increased validation  and understanding of current circumstances. In session, Intern encouraged pt to name additional support persons who could be of assistance, and brainstorm strategies for increased support as needs change through treatment.   Progress towards Goals: Progressing   Interventions: Interventions utilized:  Solution-Focused Strategies, Behavioral  Activation, Supportive Counseling, Communication Skills, and Supportive Reflection  GAD 7 PHQ 9     06/11/2024    4:12 PM 06/01/2024   11:00 AM 05/18/2024   12:10 PM 05/18/2024    9:54 AM 05/10/2024    9:42 AM  Depression screen PHQ 2/9  Decreased Interest 3 2 2 3 2   Down, Depressed, Hopeless 3 2 2 3 3   PHQ - 2 Score 6 4 4 6 5   Altered sleeping 0 2 0 1 3  Tired, decreased energy 3 1 2 3 3   Change in appetite 1 1 1 1 2   Feeling bad or failure about yourself  1 0 0 0 2  Trouble concentrating 1 0 0 0 2  Moving slowly or fidgety/restless 0 0 0 0 2  Suicidal thoughts 0 0 0 0 0  PHQ-9 Score 12 8 7 11 19   Difficult doing work/chores Somewhat difficult   Somewhat difficult Very difficult         06/11/2024    4:10 PM 08/09/2019    4:34 PM  GAD 7 : Generalized Anxiety Score  Nervous, Anxious, on Edge 2 2  Control/stop worrying 2 3  Worry too much - different things 1 2  Trouble relaxing 0 2  Restless 0 0  Easily annoyed or irritable 1 3  Afraid - awful might happen 1 3  Total GAD 7 Score 7 15  Anxiety Difficulty Somewhat difficult        Assessment: From CSW note on 05/26/24: 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 Cloretta since approx July of 2025 and has completed two cycles of treatment. Elaine Simon has history of breast cancer 1995, and completed lumpectomy, axillary dissection, chemoradiation. Today, she presented with concerns about increased depressive feelings indicated by increased fatigue, loss of interest  in activities, increased isolation, and tearfulness. These reported symptoms started after initial diagnosis and take place 3-4 times a week. Presently, the reported symptoms are starting to impact her daily life, specifically quality of life. Historically, she reported challenges of baseline of depressive feelings due to grief of the passing of her mother. She named several sources of depressive feelings: fears of long term  effects of treatment, long term quality of life, fear of end of life, symptoms related to her treatment, fatigue, and blame related to the diagnosis itself   Today 06/11/24: Elaine Simon expressed increased anxiety due to challenges with boundary setting that conflict with material and practical needs.  Pt demonstrates ability for insight as she navigates difficult relationship dynamics, and worked with Intern to identify need for autonomy, privacy, and self determination, while still needing practical support. Additional sources of support were identified, such as Elaine Simon's sister (who lives locally and is a Engineer, civil (consulting)) and friends from [employer] and Bible study. Intern provided encouragement and offered skills for maintaining autonomy while also asking for help, and managing accompanying feelings.    Plan: Follow up with CSW: Intern will call on Weds 10/1 to schedule next appointment. Behavioral recommendations:  Referral(s): Support group(s):  Pt plans to attend GI Support Group on 06/16/24 at 3pm.       Thersia KATHEE Daring Clinical Social Work Intern

## 2024-06-13 ENCOUNTER — Other Ambulatory Visit: Payer: Self-pay | Admitting: Oncology

## 2024-06-13 DIAGNOSIS — C179 Malignant neoplasm of small intestine, unspecified: Secondary | ICD-10-CM

## 2024-06-14 MED ORDER — LEUCOVORIN CALCIUM INJECTION 350 MG
400.0000 mg/m2 | Freq: Once | INTRAVENOUS | Status: AC
  Administered 2024-06-15: 736 mg via INTRAVENOUS
  Filled 2024-06-14: qty 36.8

## 2024-06-14 MED ORDER — FLUOROURACIL CHEMO INJECTION 2.5 GM/50ML
400.0000 mg/m2 | Freq: Once | INTRAVENOUS | Status: DC
  Filled 2024-06-14: qty 15

## 2024-06-14 MED ORDER — OXALIPLATIN CHEMO INJECTION 100 MG/20ML
85.0000 mg/m2 | Freq: Once | INTRAVENOUS | Status: AC
  Administered 2024-06-15: 150 mg via INTRAVENOUS
  Filled 2024-06-14: qty 20

## 2024-06-14 MED ORDER — SODIUM CHLORIDE 0.9 % IV SOLN
2400.0000 mg/m2 | INTRAVENOUS | Status: DC
  Filled 2024-06-14: qty 88

## 2024-06-15 ENCOUNTER — Inpatient Hospital Stay

## 2024-06-15 ENCOUNTER — Inpatient Hospital Stay (HOSPITAL_BASED_OUTPATIENT_CLINIC_OR_DEPARTMENT_OTHER): Admitting: Oncology

## 2024-06-15 ENCOUNTER — Other Ambulatory Visit: Payer: Self-pay | Admitting: Nurse Practitioner

## 2024-06-15 VITALS — BP 148/81 | HR 52 | Temp 97.3°F | Resp 18

## 2024-06-15 VITALS — BP 115/63 | HR 41 | Temp 97.6°F | Resp 17 | Wt 168.0 lb

## 2024-06-15 DIAGNOSIS — C179 Malignant neoplasm of small intestine, unspecified: Secondary | ICD-10-CM

## 2024-06-15 DIAGNOSIS — Z23 Encounter for immunization: Secondary | ICD-10-CM

## 2024-06-15 DIAGNOSIS — Z5111 Encounter for antineoplastic chemotherapy: Secondary | ICD-10-CM | POA: Diagnosis not present

## 2024-06-15 LAB — CBC WITH DIFFERENTIAL (CANCER CENTER ONLY)
Abs Immature Granulocytes: 0.34 K/uL — ABNORMAL HIGH (ref 0.00–0.07)
Basophils Absolute: 0.1 K/uL (ref 0.0–0.1)
Basophils Relative: 1 %
Eosinophils Absolute: 0.1 K/uL (ref 0.0–0.5)
Eosinophils Relative: 1 %
HCT: 32.1 % — ABNORMAL LOW (ref 36.0–46.0)
Hemoglobin: 10 g/dL — ABNORMAL LOW (ref 12.0–15.0)
Immature Granulocytes: 5 %
Lymphocytes Relative: 35 %
Lymphs Abs: 2.5 K/uL (ref 0.7–4.0)
MCH: 26.2 pg (ref 26.0–34.0)
MCHC: 31.2 g/dL (ref 30.0–36.0)
MCV: 84.3 fL (ref 80.0–100.0)
Monocytes Absolute: 0.6 K/uL (ref 0.1–1.0)
Monocytes Relative: 9 %
Neutro Abs: 3.7 K/uL (ref 1.7–7.7)
Neutrophils Relative %: 49 %
Platelet Count: 120 K/uL — ABNORMAL LOW (ref 150–400)
RBC: 3.81 MIL/uL — ABNORMAL LOW (ref 3.87–5.11)
RDW: 17.5 % — ABNORMAL HIGH (ref 11.5–15.5)
WBC Count: 7.3 K/uL (ref 4.0–10.5)
nRBC: 1.4 % — ABNORMAL HIGH (ref 0.0–0.2)

## 2024-06-15 LAB — CMP (CANCER CENTER ONLY)
ALT: 35 U/L (ref 0–44)
AST: 34 U/L (ref 15–41)
Albumin: 4 g/dL (ref 3.5–5.0)
Alkaline Phosphatase: 151 U/L — ABNORMAL HIGH (ref 38–126)
Anion gap: 13 (ref 5–15)
BUN: 6 mg/dL — ABNORMAL LOW (ref 8–23)
CO2: 20 mmol/L — ABNORMAL LOW (ref 22–32)
Calcium: 9.9 mg/dL (ref 8.9–10.3)
Chloride: 109 mmol/L (ref 98–111)
Creatinine: 0.88 mg/dL (ref 0.44–1.00)
GFR, Estimated: >60 mL/min (ref >60)
Glucose, Bld: 115 mg/dL — ABNORMAL HIGH (ref 70–99)
Potassium: 3.7 mmol/L (ref 3.5–5.1)
Sodium: 142 mmol/L (ref 135–145)
Total Bilirubin: 0.3 mg/dL (ref 0.0–1.2)
Total Protein: 6.8 g/dL (ref 6.5–8.1)

## 2024-06-15 LAB — CEA (ACCESS): CEA (CHCC): 114.14 ng/mL — ABNORMAL HIGH (ref 0.00–5.00)

## 2024-06-15 MED ORDER — PALONOSETRON HCL INJECTION 0.25 MG/5ML
0.2500 mg | Freq: Once | INTRAVENOUS | Status: AC
  Administered 2024-06-15: 0.25 mg via INTRAVENOUS
  Filled 2024-06-15: qty 5

## 2024-06-15 MED ORDER — HYDROCODONE-ACETAMINOPHEN 5-325 MG PO TABS: 1.0000 | ORAL_TABLET | Freq: Four times a day (QID) | ORAL | 0 refills | Status: AC | PRN

## 2024-06-15 MED ORDER — SODIUM CHLORIDE 0.9 % IV SOLN
1800.0000 mg/m2 | INTRAVENOUS | Status: DC
  Administered 2024-06-15: 3500 mg via INTRAVENOUS
  Filled 2024-06-15: qty 70

## 2024-06-15 MED ORDER — INFLUENZA VAC SPLIT HIGH-DOSE 0.5 ML IM SUSY
0.5000 mL | PREFILLED_SYRINGE | Freq: Once | INTRAMUSCULAR | Status: AC
  Administered 2024-06-15: 0.5 mL via INTRAMUSCULAR
  Filled 2024-06-15: qty 0.5

## 2024-06-15 MED ORDER — DEXTROSE 5 % IV SOLN: INTRAVENOUS | Status: DC

## 2024-06-15 MED ORDER — DEXAMETHASONE SODIUM PHOSPHATE 10 MG/ML IJ SOLN
10.0000 mg | Freq: Once | INTRAMUSCULAR | Status: AC
  Administered 2024-06-15: 10 mg via INTRAVENOUS
  Filled 2024-06-15: qty 1

## 2024-06-15 NOTE — Progress Notes (Signed)
 [facility] CSW Progress Note  Visual merchandiser briefly met with patient during her infusion treatment. Patient was accompanied by close friend, who is her support person. CSW provided on education on medicaid application (she completed application approx 3 weeks ago), status unknown at this time. CSW provided copy of [facility] Financial Assistance application for her to review. As of today, patient is doing well.     Follow Up Plan:  CSW is in close contact with patient.     Lizbeth Sprague, LCSW Clinical Social Worker [facility]

## 2024-06-15 NOTE — Patient Instructions (Signed)

## 2024-06-15 NOTE — Patient Instructions (Signed)
 [facility] - A DEPT OF [facility]  Discharge Instructions: Thank you for choosing [facility] to provide your oncology and hematology care.   If you have a lab appointment with the Cancer Center, please go directly to the Cancer Center and check in at the registration area.   Wear comfortable clothing and clothing appropriate for easy access to any Portacath or PICC line.   We strive to give you quality time with your provider. You may need to reschedule your appointment if you arrive late (15 or more minutes).  Arriving late affects you and other patients whose appointments are after yours.  Also, if you miss three or more appointments without notifying the office, you may be dismissed from the clinic at the provider's discretion.      For prescription refill requests, have your pharmacy contact our office and allow 72 hours for refills to be completed.    Today you received the following chemotherapy and/or immunotherapy agents: oxaliplatin , leucovorin , fluorouracil        To help prevent nausea and vomiting after your treatment, we encourage you to take your nausea medication as directed.  BELOW ARE SYMPTOMS THAT SHOULD BE REPORTED IMMEDIATELY: *FEVER GREATER THAN 100.4 F (38 C) OR HIGHER *CHILLS OR SWEATING *NAUSEA AND VOMITING THAT IS NOT CONTROLLED WITH YOUR NAUSEA MEDICATION *UNUSUAL SHORTNESS OF BREATH *UNUSUAL BRUISING OR BLEEDING *URINARY PROBLEMS (pain or burning when urinating, or frequent urination) *BOWEL PROBLEMS (unusual diarrhea, constipation, pain near the anus) TENDERNESS IN MOUTH AND THROAT WITH OR WITHOUT PRESENCE OF ULCERS (sore throat, sores in mouth, or a toothache) UNUSUAL RASH, SWELLING OR PAIN  UNUSUAL VAGINAL DISCHARGE OR ITCHING   Items with * indicate a potential emergency and should be followed up as soon as possible or go to the Emergency Department if any problems should occur.  Please show the CHEMOTHERAPY  ALERT CARD or IMMUNOTHERAPY ALERT CARD at check-in to the Emergency Department and triage nurse.  Should you have questions after your visit or need to cancel or reschedule your appointment, please contact [facility] - A DEPT OF [facility]  Dept: [phone]  and follow the prompts.  Office hours are 8:00 a.m. to 4:30 p.m. Monday - Friday. Please note that voicemails left after 4:00 p.m. may not be returned until the following business day.  We are closed weekends and major holidays. You have access to a nurse at all times for urgent questions. Please call the main number to the clinic Dept: [phone] and follow the prompts.   For any non-urgent questions, you may also contact your provider using MyChart. We now offer e-Visits for anyone 22 and older to request care online for non-urgent symptoms. For details visit [email or web address].   Also download the MyChart app! Go to the app store, search MyChart, open the app, select [facility], and log in with your MyChart username and password.

## 2024-06-15 NOTE — Progress Notes (Signed)
 MD will also dose reduce the 5 FU pump. He is telling pharmacy now'

## 2024-06-15 NOTE — Progress Notes (Signed)
 Omit 5FU push and decrease 5FU pump to 1800mg /m2 per Dr Cloretta

## 2024-06-15 NOTE — Progress Notes (Signed)
 Patient seen by Dr. Arley Hof today  Vitals are within treatment parameters:No (Please specify and give further instructions.) OK to proceed w/pulse 41 (her normal)  Labs are within treatment parameters: Yes   Treatment plan has been signed: Yes   Per physician team, Patient is ready for treatment. Please note the following modifications: MD has discontinued the 5FU bolus  Patient would like flu vaccine today.

## 2024-06-15 NOTE — Progress Notes (Signed)
 [facility] OFFICE PROGRESS NOTE   Diagnosis: Small bowel carcinoma  INTERVAL HISTORY:   Elaine Simon returns as scheduled.  She completed another cycle of FOLFOX 06/02/2024.  No nausea/vomiting.  She had mouth fullness without discrete ulcers.  She had diarrhea for several days following chemotherapy.  She reports cold sensitivity for 5 days.  She has intermittent numbness in the feet.  She had numbness prior to chemotherapy, but this has progressed.  She has intermittent abdomen and back pain.  She requests refills on Xanax  and hydrocodone .  Objective:  Vital signs in last 24 hours:  Blood pressure 115/63, pulse (!) 41, temperature 97.6 F (36.4 C), temperature source Temporal, resp. rate 17, weight 168 lb (76.2 kg), SpO2 98%.    HEENT: Erythematous areas at the bilateral buccal mucosa,?  Healing ulcers, no thrush Resp: Lungs clear bilaterally Cardio: Regular rhythm with grouped beats GI: No hepatomegaly, nontender Vascular: No leg edema Neuro: Mild loss of vibratory sense at the fingertips bilaterally  Portacath/PICC-without erythema  Lab Results:  Lab Results  Component Value Date   WBC 7.3 06/15/2024   HGB 10.0 (L) 06/15/2024   HCT 32.1 (L) 06/15/2024   MCV 84.3 06/15/2024   PLT 120 (L) 06/15/2024   NEUTROABS PENDING 06/15/2024    CMP  Lab Results  Component Value Date   NA 144 06/01/2024   K 4.2 06/01/2024   CL 109 06/01/2024   CO2 22 06/01/2024   GLUCOSE 118 (H) 06/01/2024   BUN 9 06/01/2024   CREATININE 1.02 (H) 06/01/2024   CALCIUM  10.1 06/01/2024   PROT 7.2 06/01/2024   ALBUMIN 4.2 06/01/2024   AST 42 (H) 06/01/2024   ALT 38 06/01/2024   ALKPHOS 156 (H) 06/01/2024   BILITOT 0.2 06/01/2024   GFRNONAA 58 (L) 06/01/2024   GFRAA 75 09/26/2020    Lab Results  Component Value Date   CEA 96.34 (H) 06/02/2024    Lab Results  Component Value Date   INR 1.0 04/08/2024   LABPROT 13.7 04/08/2024    Imaging:  No results  found.  Medications: I have reviewed the patient's current medications.   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  Peripheral hypermetabolism in the posterior right hepatic lesion.  Several tiny bilateral pulmonary nodules measuring 3 to 5 mm, too small to characterize by PET.  Scattered foci of hypermetabolism along the length of the colon, nonspecific. 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  She has completed 3 cycles of FOLFOX.  She will complete cycle 4 today.  She had increased diarrhea following the last cycle of chemotherapy.  The 5-FU bolus will be  eliminated and the 5-FU infusion will be dose reduced.  She appears to be developing mild mucositis from chemotherapy.  We prescribed Magic mouthwash.  Elaine Simon will return for an office visit prior to cycle 5 chemotherapy in 2 weeks.  She will be referred for a restaging CT evaluation after cycle 5.  The CEA is stable.  Elaine Simon received an influenza vaccine today.  She plans to obtain a COVID-19 vaccine.  Arley Hof, MD  06/15/2024  10:32 AM

## 2024-06-16 ENCOUNTER — Other Ambulatory Visit (HOSPITAL_BASED_OUTPATIENT_CLINIC_OR_DEPARTMENT_OTHER): Payer: Self-pay

## 2024-06-16 MED ORDER — NYSTATIN 100000 UNIT/ML MT SUSP
5.0000 mL | Freq: Four times a day (QID) | OROMUCOSAL | 2 refills | Status: AC | PRN
  Filled 2024-06-18: qty 240, 12d supply, fill #0

## 2024-06-16 NOTE — Progress Notes (Signed)
 I reviewed patient visit with social work Tax inspector. I concur with the treatment plan as documented in the SW intern's note.   Analis Distler E Siaosi Alter, LCSW Clinical Child psychotherapist

## 2024-06-17 ENCOUNTER — Inpatient Hospital Stay: Attending: Nurse Practitioner

## 2024-06-17 VITALS — BP 138/64 | HR 52 | Temp 97.8°F

## 2024-06-17 DIAGNOSIS — Z79899 Other long term (current) drug therapy: Secondary | ICD-10-CM | POA: Insufficient documentation

## 2024-06-17 DIAGNOSIS — F419 Anxiety disorder, unspecified: Secondary | ICD-10-CM | POA: Insufficient documentation

## 2024-06-17 DIAGNOSIS — K769 Liver disease, unspecified: Secondary | ICD-10-CM | POA: Diagnosis not present

## 2024-06-17 DIAGNOSIS — R630 Anorexia: Secondary | ICD-10-CM | POA: Diagnosis not present

## 2024-06-17 DIAGNOSIS — L819 Disorder of pigmentation, unspecified: Secondary | ICD-10-CM | POA: Insufficient documentation

## 2024-06-17 DIAGNOSIS — Z5111 Encounter for antineoplastic chemotherapy: Secondary | ICD-10-CM | POA: Diagnosis present

## 2024-06-17 DIAGNOSIS — D709 Neutropenia, unspecified: Secondary | ICD-10-CM | POA: Insufficient documentation

## 2024-06-17 DIAGNOSIS — Z79630 Long term (current) use of alkylating agent: Secondary | ICD-10-CM | POA: Diagnosis not present

## 2024-06-17 DIAGNOSIS — C179 Malignant neoplasm of small intestine, unspecified: Secondary | ICD-10-CM

## 2024-06-17 DIAGNOSIS — E039 Hypothyroidism, unspecified: Secondary | ICD-10-CM | POA: Insufficient documentation

## 2024-06-17 DIAGNOSIS — R197 Diarrhea, unspecified: Secondary | ICD-10-CM | POA: Diagnosis not present

## 2024-06-17 DIAGNOSIS — I10 Essential (primary) hypertension: Secondary | ICD-10-CM | POA: Insufficient documentation

## 2024-06-17 DIAGNOSIS — Z79631 Long term (current) use of antimetabolite agent: Secondary | ICD-10-CM | POA: Diagnosis not present

## 2024-06-17 DIAGNOSIS — F32A Depression, unspecified: Secondary | ICD-10-CM | POA: Insufficient documentation

## 2024-06-17 DIAGNOSIS — R200 Anesthesia of skin: Secondary | ICD-10-CM | POA: Insufficient documentation

## 2024-06-17 DIAGNOSIS — M1990 Unspecified osteoarthritis, unspecified site: Secondary | ICD-10-CM | POA: Insufficient documentation

## 2024-06-17 DIAGNOSIS — C170 Malignant neoplasm of duodenum: Secondary | ICD-10-CM | POA: Insufficient documentation

## 2024-06-17 DIAGNOSIS — Z8042 Family history of malignant neoplasm of prostate: Secondary | ICD-10-CM | POA: Diagnosis not present

## 2024-06-17 DIAGNOSIS — K1230 Oral mucositis (ulcerative), unspecified: Secondary | ICD-10-CM | POA: Insufficient documentation

## 2024-06-17 DIAGNOSIS — Z853 Personal history of malignant neoplasm of breast: Secondary | ICD-10-CM | POA: Diagnosis not present

## 2024-06-17 DIAGNOSIS — I493 Ventricular premature depolarization: Secondary | ICD-10-CM | POA: Diagnosis not present

## 2024-06-17 MED ORDER — PEGFILGRASTIM-CBQV 6 MG/0.6ML ~~LOC~~ SOSY
6.0000 mg | PREFILLED_SYRINGE | Freq: Once | SUBCUTANEOUS | Status: AC
  Administered 2024-06-17: 6 mg via SUBCUTANEOUS
  Filled 2024-06-17: qty 0.6

## 2024-06-17 NOTE — Patient Instructions (Signed)

## 2024-06-18 ENCOUNTER — Encounter: Payer: Self-pay | Admitting: Oncology

## 2024-06-18 ENCOUNTER — Other Ambulatory Visit (HOSPITAL_BASED_OUTPATIENT_CLINIC_OR_DEPARTMENT_OTHER): Payer: Self-pay

## 2024-06-21 ENCOUNTER — Telehealth: Payer: Self-pay | Admitting: *Deleted

## 2024-06-21 ENCOUNTER — Encounter: Payer: Self-pay | Admitting: *Deleted

## 2024-06-21 ENCOUNTER — Encounter: Payer: Self-pay | Admitting: General Practice

## 2024-06-21 ENCOUNTER — Telehealth: Payer: Self-pay | Admitting: Nutrition

## 2024-06-21 NOTE — Telephone Encounter (Signed)
 Elaine Simon called to inquire what she can eat with the diarrhea. Reinforced bland, low residue diet as navigator had done. Told her to take #2 imodium after each loose stool for max 8 tabs/day. Push oral fluids and be sure she is drinking enough to void several times/day. Suggested she call back tomorrow if not better.

## 2024-06-21 NOTE — Progress Notes (Signed)
 Patient called to discuss diarrhea over the weekend. She states that she took up to 4 tablets of Immodium each day on Sat and Sunday and is eating a bland diet. As of 1145 on Monday Oct 6 she has not had a bowel movement.  Encouraged her to monitor urine output, drink replacement beverages and soups and continue bland diet. Also to call with any issues or questions

## 2024-06-21 NOTE — Telephone Encounter (Signed)
 Returned patient phone call. She was not available but left message for return call if needed.

## 2024-06-21 NOTE — Progress Notes (Signed)
 [facility] Spiritual Care Note  Followed up with Elaine Simon by phone. She reports feeling lethargic after a rough weekend with a lot of diarrhea and plans to contact her nurse navigator and dietitian right away with several questions related to their recommended supportive care (medication, diet, hydration amounts, etc). Elaine Simon is hopeful to start feeling better enough to get out of the house soon for errands and enjoyment.  Faith and prayer are helping her cope, bringing comfort and connection during what can be an isolating time. A Bible study is similiarly helpful in bringing community, structure, and nourishing content, though there is some stress about feeling behind in some of the assignments.   We plan to follow up by phone on Monday 10/13 for additional spiritual support.  [address] [name], [place], [facility] Pager [phone] Voicemail [phone]

## 2024-06-23 ENCOUNTER — Encounter: Payer: Self-pay | Admitting: General Practice

## 2024-06-23 ENCOUNTER — Inpatient Hospital Stay

## 2024-06-23 NOTE — Progress Notes (Signed)
 [facility] CSW Counseling Note  Patient was referred by self. Treatment type: Individual   Presenting Concerns: Patient and/or family reports the following symptoms/concerns: depression and stress Duration of problem: since diagnosis approx 2 months ago ; Severity of problem: mild   Orientation:oriented to person, place, and time/date.   Affect: Appropriate and Congruent Risk of harm to self or others: No plan to harm self or others  Patient and/or Family's Strengths/Protective Factors: Social connections, Social and Emotional competence, Concrete supports in place (healthy food, safe environments, etc.), and ability for insight.Active sense of humor  Average or above average intelligence  Capable of independent living  Communication skills  General fund of knowledge  Motivation for treatment/growth  Religious Affiliation  Supportive family/friends      Goals Addressed: Patient will:  Reduce symptoms of: depression and stress  Increase knowledge and/or ability of:  coping skills and self-management skills.  Increase healthy adjustment to current life circumstances    Progress towards Goals: Progressing   Interventions: Interventions utilized:  Solution-Focused Strategies, Behavioral Activation, Supportive Counseling, Communication Skills, and Supportive Reflection  GAD 7 PHQ 9     06/15/2024   10:12 AM 06/11/2024    4:12 PM 06/01/2024   11:00 AM 05/18/2024   12:10 PM 05/18/2024    9:54 AM  Depression screen PHQ 2/9  Decreased Interest 1 3 2 2 3   Down, Depressed, Hopeless 1 3 2 2 3   PHQ - 2 Score 2 6 4 4 6   Altered sleeping 0 0 2 0 1  Tired, decreased energy 1 3 1 2 3   Change in appetite 0 1 1 1 1   Feeling bad or failure about yourself  0 1 0 0 0  Trouble concentrating 0 1 0 0 0  Moving slowly or fidgety/restless 0 0 0 0 0  Suicidal thoughts 0 0 0 0 0  PHQ-9 Score 3 12 8 7 11   Difficult doing work/chores  Somewhat difficult   Somewhat difficult         06/11/2024     4:10 PM 08/09/2019    4:34 PM  GAD 7 : Generalized Anxiety Score  Nervous, Anxious, on Edge 2 2  Control/stop worrying 2 3  Worry too much - different things 1 2  Trouble relaxing 0 2  Restless 0 0  Easily annoyed or irritable 1 3  Afraid - awful might happen 1 3  Total GAD 7 Score 7 15  Anxiety Difficulty Somewhat difficult        Assessment:  Patient currently experiencing increased negative side effects from treatment, and is working with oncologist and Dietician to address symptoms. Pt continues to experience increased anxiety due to challenges with boundary setting within social circle that conflict with material and practical needs. Pt demonstrates ability for insight as she navigates difficult relationship dynamics, and re-stated need for autonomy, privacy, and self determination during her cancer journey, while still needing practical support. Intern offered validation for pt's desire to take control of the aspects of her illness and tx that she can control, and provided psychosocial skills for maintaining healthy boundaries. Pt and Intern followed up about additional sources of support, and pt agreed to reach out to three people before the next appointment to ask for specific material support such as meals for the days when tx side effects are the worst.   Intern and pt also discussed skills such as visualization when health anxiety is high, such as imagining chemo meds as healing white light that cleanse  the body of harmful elements. Pt reports she has been spending a lot of time on the couch/ watching tv and feels she is not living life fully. Intern encouraged pt to rest when needed, and engage in gentle daily physical activity when able to increase feelings of physical and mental wellbeing. Pt and intern will explore values next session, and find activities pt can enjoy that align with values.    Plan: Follow up with CSW: In 2 weeks Behavioral recommendations: Reach out to 3  support people identified in session; visualization when experiencing high anxiety; 10 minutes daily gentle physical activity. Referral(s): Support group(s):  Pt attended GI Support Group on 06/16/24 and plans to join again next month.       Thersia KATHEE Daring Clinical Social Work Intern

## 2024-06-23 NOTE — Progress Notes (Signed)
 [facility] Spiritual Care Note  Ran into Elaine Simon in the hallway following another appointment and had a time of prayer together per her request. Continuing to follow for spiritual support.  [address] [name], [place], [facility] Pager [phone] Voicemail [phone]

## 2024-06-24 NOTE — Progress Notes (Signed)
 I reviewed patient visit with social work Tax inspector. I concur with the treatment plan as documented in the SW intern's note.   Analis Distler E Siaosi Alter, LCSW Clinical Child psychotherapist

## 2024-06-25 ENCOUNTER — Inpatient Hospital Stay

## 2024-06-25 NOTE — Progress Notes (Signed)
 [facility] CSW Progress Note  Clinical Social Work Intern contacted patient by phone to follow-up on emotional support and to schedule next counseling appointment.    Interventions: Provided brief mental health counseling with regard to pt's anxiety. Pt reports that recent incidents in her personal life have left her feeling like she does not have control of her personal space and autonomy. This adds another layer of stress to her present anxiety re diagnosis and treatment. Pt and intern used problem solving from a strengths based perspective to continue setting healthy boundaries while also also asking for help when needed. Pt and intern also discussed strategies such as breath work and spending mindful time outdoors to reduce anxiety.        Follow Up Plan:  CSW Intern will follow up with pt by phone in one week. Pt has direct contact for intern if needs arise in the meantime.    Thersia KATHEE Daring Clinical Social Work Intern [employer]

## 2024-06-27 ENCOUNTER — Other Ambulatory Visit: Payer: Self-pay | Admitting: Oncology

## 2024-06-28 ENCOUNTER — Encounter: Payer: Self-pay | Admitting: Oncology

## 2024-06-30 ENCOUNTER — Encounter: Payer: Self-pay | Admitting: Nurse Practitioner

## 2024-06-30 ENCOUNTER — Inpatient Hospital Stay (HOSPITAL_BASED_OUTPATIENT_CLINIC_OR_DEPARTMENT_OTHER): Admitting: Nurse Practitioner

## 2024-06-30 ENCOUNTER — Inpatient Hospital Stay

## 2024-06-30 ENCOUNTER — Telehealth: Payer: Self-pay

## 2024-06-30 VITALS — BP 114/65 | HR 70 | Temp 98.0°F | Resp 15 | Ht 62.0 in | Wt 167.3 lb

## 2024-06-30 VITALS — BP 153/83 | HR 65 | Temp 97.3°F | Resp 16

## 2024-06-30 DIAGNOSIS — C179 Malignant neoplasm of small intestine, unspecified: Secondary | ICD-10-CM

## 2024-06-30 DIAGNOSIS — Z5111 Encounter for antineoplastic chemotherapy: Secondary | ICD-10-CM | POA: Diagnosis not present

## 2024-06-30 LAB — CBC WITH DIFFERENTIAL (CANCER CENTER ONLY)
Abs Immature Granulocytes: 0.26 K/uL — ABNORMAL HIGH (ref 0.00–0.07)
Basophils Absolute: 0.1 K/uL (ref 0.0–0.1)
Basophils Relative: 1 %
Eosinophils Absolute: 0.1 K/uL (ref 0.0–0.5)
Eosinophils Relative: 0 %
HCT: 31.5 % — ABNORMAL LOW (ref 36.0–46.0)
Hemoglobin: 10 g/dL — ABNORMAL LOW (ref 12.0–15.0)
Immature Granulocytes: 2 %
Lymphocytes Relative: 24 %
Lymphs Abs: 2.7 K/uL (ref 0.7–4.0)
MCH: 25.8 pg — ABNORMAL LOW (ref 26.0–34.0)
MCHC: 31.7 g/dL (ref 30.0–36.0)
MCV: 81.4 fL (ref 80.0–100.0)
Monocytes Absolute: 0.7 K/uL (ref 0.1–1.0)
Monocytes Relative: 6 %
Neutro Abs: 7.8 K/uL — ABNORMAL HIGH (ref 1.7–7.7)
Neutrophils Relative %: 67 %
Platelet Count: 133 K/uL — ABNORMAL LOW (ref 150–400)
RBC: 3.87 MIL/uL (ref 3.87–5.11)
RDW: 19 % — ABNORMAL HIGH (ref 11.5–15.5)
WBC Count: 11.5 K/uL — ABNORMAL HIGH (ref 4.0–10.5)
nRBC: 0.3 % — ABNORMAL HIGH (ref 0.0–0.2)

## 2024-06-30 LAB — CMP (CANCER CENTER ONLY)
ALT: 46 U/L — ABNORMAL HIGH (ref 0–44)
AST: 44 U/L — ABNORMAL HIGH (ref 15–41)
Albumin: 4.1 g/dL (ref 3.5–5.0)
Alkaline Phosphatase: 213 U/L — ABNORMAL HIGH (ref 38–126)
Anion gap: 12 (ref 5–15)
BUN: 8 mg/dL (ref 8–23)
CO2: 21 mmol/L — ABNORMAL LOW (ref 22–32)
Calcium: 10 mg/dL (ref 8.9–10.3)
Chloride: 106 mmol/L (ref 98–111)
Creatinine: 0.86 mg/dL (ref 0.44–1.00)
GFR, Estimated: >60 mL/min (ref >60)
Glucose, Bld: 116 mg/dL — ABNORMAL HIGH (ref 70–99)
Potassium: 3.5 mmol/L (ref 3.5–5.1)
Sodium: 140 mmol/L (ref 135–145)
Total Bilirubin: 0.3 mg/dL (ref 0.0–1.2)
Total Protein: 6.9 g/dL (ref 6.5–8.1)

## 2024-06-30 LAB — CEA (ACCESS): CEA (CHCC): 71.93 ng/mL — ABNORMAL HIGH (ref 0.00–5.00)

## 2024-06-30 MED ORDER — OXALIPLATIN CHEMO INJECTION 100 MG/20ML
85.0000 mg/m2 | Freq: Once | INTRAVENOUS | Status: AC
  Administered 2024-06-30: 150 mg via INTRAVENOUS
  Filled 2024-06-30: qty 20

## 2024-06-30 MED ORDER — DEXTROSE 5 % IV SOLN: INTRAVENOUS | Status: DC

## 2024-06-30 MED ORDER — DEXAMETHASONE SOD PHOSPHATE PF 10 MG/ML IJ SOLN
10.0000 mg | Freq: Once | INTRAMUSCULAR | Status: AC
  Administered 2024-06-30: 10 mg via INTRAVENOUS

## 2024-06-30 MED ORDER — LEUCOVORIN CALCIUM INJECTION 350 MG
400.0000 mg/m2 | Freq: Once | INTRAVENOUS | Status: AC
  Administered 2024-06-30: 736 mg via INTRAVENOUS
  Filled 2024-06-30: qty 36.8

## 2024-06-30 MED ORDER — LEUCOVORIN CALCIUM INJECTION 350 MG
400.0000 mg/m2 | Freq: Once | INTRAVENOUS | Status: DC
  Filled 2024-06-30: qty 36.8

## 2024-06-30 MED ORDER — PALONOSETRON HCL INJECTION 0.25 MG/5ML
0.2500 mg | Freq: Once | INTRAVENOUS | Status: AC
  Administered 2024-06-30: 0.25 mg via INTRAVENOUS
  Filled 2024-06-30: qty 5

## 2024-06-30 MED ORDER — SODIUM CHLORIDE 0.9 % IV SOLN
1200.0000 mg/m2 | INTRAVENOUS | Status: DC
  Administered 2024-06-30: 2200 mg via INTRAVENOUS
  Filled 2024-06-30: qty 44

## 2024-06-30 NOTE — Progress Notes (Signed)
 Patient seen by Olam Ned NP today  Vitals are within treatment parameters:Yes   Labs are within treatment parameters: Yes   Treatment plan has been signed: Yes   Per physician team, Patient is ready for treatment. Please note the following modifications: Please note there will be a 5FU dose reduced. Please do not release the 5FU until the provider change the order.

## 2024-06-30 NOTE — Patient Instructions (Signed)
 [facility] - A DEPT OF [facility]  Discharge Instructions: Thank you for choosing [facility] to provide your oncology and hematology care.   If you have a lab appointment with the Cancer Center, please go directly to the Cancer Center and check in at the registration area.   Wear comfortable clothing and clothing appropriate for easy access to any Portacath or PICC line.   We strive to give you quality time with your provider. You may need to reschedule your appointment if you arrive late (15 or more minutes).  Arriving late affects you and other patients whose appointments are after yours.  Also, if you miss three or more appointments without notifying the office, you may be dismissed from the clinic at the provider's discretion.      For prescription refill requests, have your pharmacy contact our office and allow 72 hours for refills to be completed.    Today you received the following chemotherapy and/or immunotherapy agents: oxaliplatin , leucovorin , fluorouracil        To help prevent nausea and vomiting after your treatment, we encourage you to take your nausea medication as directed.  BELOW ARE SYMPTOMS THAT SHOULD BE REPORTED IMMEDIATELY: *FEVER GREATER THAN 100.4 F (38 C) OR HIGHER *CHILLS OR SWEATING *NAUSEA AND VOMITING THAT IS NOT CONTROLLED WITH YOUR NAUSEA MEDICATION *UNUSUAL SHORTNESS OF BREATH *UNUSUAL BRUISING OR BLEEDING *URINARY PROBLEMS (pain or burning when urinating, or frequent urination) *BOWEL PROBLEMS (unusual diarrhea, constipation, pain near the anus) TENDERNESS IN MOUTH AND THROAT WITH OR WITHOUT PRESENCE OF ULCERS (sore throat, sores in mouth, or a toothache) UNUSUAL RASH, SWELLING OR PAIN  UNUSUAL VAGINAL DISCHARGE OR ITCHING   Items with * indicate a potential emergency and should be followed up as soon as possible or go to the Emergency Department if any problems should occur.  Please show the CHEMOTHERAPY  ALERT CARD or IMMUNOTHERAPY ALERT CARD at check-in to the Emergency Department and triage nurse.  Should you have questions after your visit or need to cancel or reschedule your appointment, please contact [facility] - A DEPT OF [facility]  Dept: [phone]  and follow the prompts.  Office hours are 8:00 a.m. to 4:30 p.m. Monday - Friday. Please note that voicemails left after 4:00 p.m. may not be returned until the following business day.  We are closed weekends and major holidays. You have access to a nurse at all times for urgent questions. Please call the main number to the clinic Dept: [phone] and follow the prompts.   For any non-urgent questions, you may also contact your provider using MyChart. We now offer e-Visits for anyone 22 and older to request care online for non-urgent symptoms. For details visit [email or web address].   Also download the MyChart app! Go to the app store, search MyChart, open the app, select [facility], and log in with your MyChart username and password.

## 2024-06-30 NOTE — Patient Instructions (Signed)

## 2024-06-30 NOTE — Telephone Encounter (Signed)
 CSW Intern called patient to follow up on emotional needs. Pt unavailable to speak at this time and requested call back on 10/16.  Elaine Simon Clinical Social Work Intern [employer]

## 2024-06-30 NOTE — Progress Notes (Signed)
 [facility] OFFICE PROGRESS NOTE   Diagnosis: Small bowel carcinoma  INTERVAL HISTORY:   Elaine Simon returns as scheduled.  She completed cycle 4 FOLFOX 06/15/2024.  5-FU bolus was eliminated and 5-FU infusion dose reduced due to mucositis.  She denies nausea/vomiting.  No mouth sores.  She developed diarrhea around day 3, had multiple episodes both day and night.  Imodium was intermittently effective.  She did not take maximal doses of Imodium.  The diarrhea persisted for about a week.  She more recently notes 1 or 2 loose stools each night.  She has stable numbness in the left foot.  This predated chemotherapy.  Cold sensitivity lasted about 4 days.  She denies abdominal pain.  Overall good appetite.  Objective:  Vital signs in last 24 hours:  Blood pressure 114/65, pulse 70, temperature 98 F (36.7 C), temperature source Oral, resp. rate 15, height 5' 2 (1.575 m), weight 167 lb 4.8 oz (75.9 kg), SpO2 99%.    HEENT: No thrush or ulcers. Resp: Lungs clear bilaterally. Cardio: Regular rate and rhythm. GI: Abdomen soft and nontender.  No hepatosplenomegaly Vascular: No leg edema. Neuro: Vibratory sense intact over the fingertips for tuning fork exam. Skin: Palms and soles with mild hyperpigmentation.  No skin breakdown. Port-A-Cath without erythema.  Lab Results:  Lab Results  Component Value Date   WBC 7.3 06/15/2024   HGB 10.0 (L) 06/15/2024   HCT 32.1 (L) 06/15/2024   MCV 84.3 06/15/2024   PLT 120 (L) 06/15/2024   NEUTROABS 3.7 06/15/2024    Imaging:  No results found.  Medications: I have reviewed the patient's current medications.  Assessment/Plan: Duodenal carcinoma 04/04/2024-CT abdomen/pelvis: Heterogenous partially exophytic mass in the posterior dome of the right liver, heterogenous low-attenuation mass in the descending duodenum with extraluminal extension 04/06/2024 upper endoscopy: Large mass in the second portion of the duodenum,  completely obstructing, ulcerated.  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  Peripheral hypermetabolism in the posterior right hepatic lesion.  Several tiny bilateral pulmonary nodules measuring 3 to 5 mm, too small to characterize by PET.  Scattered foci of hypermetabolism along the length of the colon, nonspecific. Cycle 1 FOLFOX 04/27/2024 Treatment held 05/10/2024 due to neutropenia (ANC 0.7) Cycle 2 FOLFOX 05/18/2024, Udenyca  Cycle 3 FOLFOX 06/02/2024, Udenyca  Cycle 4 FOLFOX 06/15/2024, 5-FU bolus eliminated, Udenyca  Cycle 5 FOLFOX 06/30/2024, 5-FU pump dose reduced due to diarrhea, Udenyca  Stage I left breast cancer 1995, status post lumpectomy and left axillary dissection, adjuvant chemotherapy, radiation, and a visa Hypertension Hypothyroidism Osteoarthritis Normocytic anemia July 2025 Anorexia/weight loss secondary to #1 Anxiety/depression Family history of prostate cancer  Disposition: Elaine Simon appears stable.  She has completed 4 cycles of FOLFOX.  5-FU bolus was eliminated and 5-FU pump dose reduced with cycle 4 due to mucositis and diarrhea.  She again had fairly significant diarrhea.  5-FU pump will be further dose reduced.  We reviewed the dosing instructions for Imodium.  She understands to contact the office with poorly controlled diarrhea.  She will submit a stool sample for C. difficile testing though I feel this is unlikely given the time course.    At her visit 06/15/2024 restaging CTs were  discussed to occur after cycle 5.  She is scheduled for follow-up at [facility] 07/21/2024 and had requested CT scans to be completed there.  She will let us  know if she changes her mind and would like scans to be completed in [place].  CBC and chemistry panel reviewed.  Labs are adequate for treatment.  AST/ALT mildly elevated.  Continue to monitor.  She will return for follow-up and cycle 6 FOLFOX in 2 weeks.  We are available to see her sooner if needed.  Plan reviewed with Dr. Cloretta.    Olam Ned ANP/GNP-BC   06/30/2024  9:29 AM

## 2024-07-01 ENCOUNTER — Other Ambulatory Visit: Payer: Self-pay

## 2024-07-02 ENCOUNTER — Inpatient Hospital Stay

## 2024-07-02 VITALS — BP 142/79 | HR 61 | Temp 98.2°F | Resp 16

## 2024-07-02 DIAGNOSIS — Z5111 Encounter for antineoplastic chemotherapy: Secondary | ICD-10-CM | POA: Diagnosis not present

## 2024-07-02 DIAGNOSIS — C179 Malignant neoplasm of small intestine, unspecified: Secondary | ICD-10-CM

## 2024-07-02 MED ORDER — PEGFILGRASTIM-CBQV 6 MG/0.6ML ~~LOC~~ SOSY
6.0000 mg | PREFILLED_SYRINGE | Freq: Once | SUBCUTANEOUS | Status: AC
  Administered 2024-07-02: 6 mg via SUBCUTANEOUS
  Filled 2024-07-02: qty 0.6

## 2024-07-02 NOTE — Patient Instructions (Signed)

## 2024-07-02 NOTE — Progress Notes (Signed)
 I reviewed patient visit with social work Tax inspector. I concur with the treatment plan as documented in the SW intern's note.   Analis Distler E Siaosi Alter, LCSW Clinical Child psychotherapist

## 2024-07-02 NOTE — Progress Notes (Signed)
 [facility] CSW Progress Note  Clinical Social Work Intern contacted patient by phone to follow-up on emotional support.    Interventions: Provided brief mental health counseling with regard to managing weariness from illness and treatment that includes physical, mental and emotional fatigue. Pt and Intern problem-solved ways to stay active by setting small attainable goals, such as 10 min/day gentle stretching, or going to the market when energy allows for very few items. Pt states concern that she can't get on with it like other cancer patients who appear to be managing treatment with more ease. Intern asserted that each individual responds to tx differently, and encouraged pt to focus on own self care rather than comparing to others. Pt was reminded of GI support group as source of support from others with similar experiences.         Follow Up Plan:  CSW will see patient on 07/07/24.    Thersia KATHEE Daring Clinical Social Work Intern [employer]

## 2024-07-05 ENCOUNTER — Encounter: Payer: Self-pay | Admitting: Oncology

## 2024-07-05 NOTE — Progress Notes (Signed)
 SURGICAL ONCOLOGY CLINIC NEW PATIENT VISIT  Patient: Elaine Simon MRN: [id number] Date: 07/05/2024   DIAGNOSIS: No diagnosis found.  PREVIOUS TREATMENT: 04/27/24 - 06/30/24: 5 cycles of FOLFOX with cycle 4 5-FU eliminated and cycle 5 dose reduced  HISTORY OF PRESENT ILLNESS:  The patient is a 73 y.o. female presenting for evaluation of Duodenal carcinoma   Was experiencing 2 weeks of fatigue, poor appetite and early satiety.  04/04/24 - CT AP - 1. There is a heterogeneous low-density partially exophytic mass within the posterior dome of the right lobe of the liver measuring 5.7 x 6.2 by 4.5 cm. This is concerning for hepatic metastatic disease. 2. There is a heterogeneous low-attenuation mass along the course of the descending duodenum which measures 3.4 x 3.4 by 6.9 cm. There appears to be extraluminal extension of this mass along the anterior and medial wall of the duodenum at the junction between the descending and horizontal portions of the duodenum. This is  concerning for a primary duodenal neoplasm. 3. Colonic diverticulosis without signs of acute diverticulitis.  4. Small to moderate hiatal hernia. 5. Multiple calcified uterine fibroids. 6.  Aortic Atherosclerosis (ICD10-I70.0).  04/06/2024 upper endoscopy: Large mass in the second portion of the duodenum, completely obstructing, ulcerated. Biopsy-moderately differentiated adenocarcinoma with mucinous features; microsatellite instability not detected  04/08/24 - liver biopsy - -  Liver with areas of acute inflammation and possible ascending cholangitis with a prominent atypical appearing lymphoid aggregate in the background of pools of acellular mucin  04/20/24 - PET - 1. Markedly hypermetabolic duodenal lesion with hypermetabolic lymph nodes in the hepatoduodenal ligament. Imaging features are compatible with patient's known neoplasm. 2. Peripheral hypermetabolism in the patient's known posterior right hepatic lesion with central  photopenia compatible with necrosis. Imaging features are compatible with neoplastic disease. 3. Several tiny bilateral pulmonary nodules measuring in the 3-5 mm size range. These are too small to characterize by PET-CT but metastatic disease is a concern. Close attention on follow-up recommended 4. Scattered foci of hypermetabolism along the length of the colon, nonspecific. Correlation with colonoscopy may prove helpful. 5. Left colonic diverticulosis without diverticulitis. 6. Calcified uterine fibroids. 7.  Aortic Atherosclerosis (ICD10-I70.0).  04/27/24 - 06/30/24: 5 cycles of FOLFOX with cycle 4 5-FU eliminated and cycle 5 dose reduced  Reports doing reasonably with chemo. Feels depressed. Feeling fatigued. Lost 10 lbs since onset of symptoms but has gained some weight back. Has diarrhea. No vomiting.   MEDS: Current Rx ordered in [facility][1]  PROBLEM LIST: Problem List[2]   ALLERGIES: Allergies[3]   PSH: Surgical History[4] Abdominoplasty PMH: Medical History[5]   SOCIAL HISTORY: Social History   Socioeconomic History  . Marital status: Single    Spouse name: Not on file  . Number of children: Not on file  . Years of education: Not on file  . Highest education level: Not on file  Occupational History  . Not on file  Tobacco Use  . Smoking status: Never  . Smokeless tobacco: Never  Substance and Sexual Activity  . Alcohol use: Yes  . Drug use: Yes  . Sexual activity: Not Currently    Birth control/protection: Post-menopausal  Other Topics Concern  . Not on file  Social History Narrative  . Not on file   Social Drivers of Health   Food Insecurity: Food Insecurity Present (04/14/2024)   Received from [facility]   Food vital sign   . Within the past 12 months, you worried that your food would run out before  you got money to buy more: Often true   . Within the past 12 months, the food you bought just didn't last and you didn't have money to get more: Often true   Transportation Needs: No Transportation Needs (04/14/2024)   Received from [facility] - Transportation   . In the past 12 months, has lack of transportation kept you from medical appointments or from getting medications?: No   . In the past 12 months, has lack of transportation kept you from meetings, work, or from getting things needed for daily living?: No  Safety: Not At Risk (04/14/2024)   Received from [facility]   Safety   . Within the last year, have you been afraid of your partner or ex-partner?: No   . Within the last year, have you been humiliated or emotionally abused in other ways by your partner or ex-partner?: No   . Within the last year, have you been kicked, hit, slapped, or otherwise physically hurt by your partner or ex-partner?: No   . Within the last year, have you been raped or forced to have any kind of sexual activity by your partner or ex-partner?: No  Living Situation: Unknown (04/14/2024)   Received from [facility] Situation   . In the last 12 months, was there a time when you were not able to pay the mortgage or rent on time?: No   . Number of Times Moved in the Last Year: Not on file   . At any time in the past 12 months, were you homeless or living in a shelter (including now)?: No    FAMILY HISTORY: Family History[6]  ROS: A 14 point review of systems was negative except for as stated in the HPI  ECOG/Zubrod Performance Status: 1 - Strenous physical activity restricted; fully ambulatory and able to carry out light work KPS (80-70%)  PHYSICAL EXAMINATION: VITAL SIGNS: There were no vitals taken for this visit.  HEENT: Atraumatic, Normocephalic,Extraocular muscles are intact. Pupils reactive to accommodation, bilateral. No palpable adenopathy of the head and neck region. NECK: Supple and normal thyroid  size, no jugulo-venous distention. LUNGS: Clear to auscultation bilaterally HEART: Regular Rate Rhythm LYMPHATIC: No palpable  bilateral axillary, cervical or inguinal lymph nodes. ABDOMEN: Soft, nontender, no hepatosplenomegaly. EXTREMITIES: Without cyanosis, clubbing or edema. NEUROLOGIC: Alert and oriented x 3.  Normal mood and normal affect   LABS: CEA 71.9 (06/30/24),  CEA 114.6 (04/22/24)  Sodium 135 - 145 mmol/L 140   Potassium 3.5 - 5.1 mmol/L 3.5   Chloride 98 - 111 mmol/L 106   CO2 22 - 32 mmol/L 21 Low    Glucose, Bld 70 - 99 mg/dL 883 High  Glucose reference range applies only to samples taken after fasting for at least 8 hours.  BUN 8 - 23 mg/dL 8   Creatinine 9.55 - 8.99 mg/dL 9.13   Calcium  8.9 - 10.3 mg/dL 89.9   Total Protein 6.5 - 8.1 g/dL 6.9   Albumin 3.5 - 5.0 g/dL 4.1   AST 15 - 41 U/L 44 High    ALT 0 - 44 U/L 46 High    Alkaline Phosphatase 38 - 126 U/L 213 High    Total Bilirubin 0.0 - 1.2 mg/dL 0.3   GFR, Estimated >39 mL/min >60 (NOTE) Calculated using the CKD-EPI Creatinine Equation (2021)  Anion gap 5 - 15 12    WBC Count 4.0 - 10.5 K/uL 11.5 High    RBC 3.87 -  5.11 MIL/uL 3.87   Hemoglobin 12.0 - 15.0 g/dL 89.9 Low    HCT 63.9 - 46.0 % 31.5 Low    MCV 80.0 - 100.0 fL 81.4   MCH 26.0 - 34.0 pg 25.8 Low    MCHC 30.0 - 36.0 g/dL 68.2   RDW 88.4 - 84.4 % 19.0 High    Platelet Count 150 - 400 K/uL 133 Low    nRBC 0.0 - 0.2 % 0.3 High    Neutrophils Relative % % 67   Neutro Abs 1.7 - 7.7 K/uL 7.8 High    Lymphocytes Relative % 24   Lymphs Abs 0.7 - 4.0 K/uL 2.7   Monocytes Relative % 6   Monocytes Absolute 0.1 - 1.0 K/uL 0.7   Eosinophils Relative % 0   Eosinophils Absolute 0.0 - 0.5 K/uL 0.1   Basophils Relative % 1   Basophils Absolute 0.0 - 0.1 K/uL 0.1   Immature Granulocytes % 2   Abs Immature Granulocytes 0.00 - 0.07 K/uL 0.26 High      IMAGING: NUCLEAR MEDICINE PET SKULL BASE TO THIGH 04/20/24 IMPRESSION:  1. Markedly hypermetabolic duodenal lesion with hypermetabolic lymph  nodes in the hepatoduodenal ligament. Imaging features are  compatible with  patient's known neoplasm.  2. Peripheral hypermetabolism in the patient's known posterior right  hepatic lesion with central photopenia compatible with necrosis.  Imaging features are compatible with neoplastic disease.  3. Several tiny bilateral pulmonary nodules measuring in the 3-5 mm  size range. These are too small to characterize by PET-CT but  metastatic disease is a concern. Close attention on follow-up  recommended  4. Scattered foci of hypermetabolism along the length of the colon,  nonspecific. Correlation with colonoscopy may prove helpful.  5. Left colonic diverticulosis without diverticulitis.  6. Calcified uterine fibroids.  7.  Aortic Atherosclerosis (ICD10-I70.0).   CT ABDOMEN AND PELVIS WITH CONTRAST 04/04/24 IMPRESSION:  1. There is a heterogeneous low-density partially exophytic mass  within the posterior dome of the right lobe of the liver measuring  5.7 x 6.2 by 4.5 cm. This is concerning for hepatic metastatic  disease.  2. There is a heterogeneous low-attenuation mass along the course of  the descending duodenum which measures 3.4 x 3.4 by 6.9 cm. There  appears to be extraluminal extension of this mass along the anterior  and medial wall of the duodenum at the junction between the  descending and horizontal portions of the duodenum. This is  concerning for a primary duodenal neoplasm.  3. Colonic diverticulosis without signs of acute diverticulitis.  4. Small to moderate hiatal hernia.  5. Multiple calcified uterine fibroids.  6.  Aortic Atherosclerosis (ICD10-I70.0).   PROCEDURE     Media Information      PATHOLOGY:   Media Information         ASSESSMENT/PLAN: Elaine Simon is a 73 y.o. 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 Has completed 5 cycle of FOLFOX with some dose reduction for diarrhea. Maintaining good functional  status. Has biochemical response.   - complete 6 cycle of chemo - repeat CT on 07/26/24 - will discuss on [facility]OC on 07/27/24 - RTC on 07/30/24  Alberteen Nearing 11:06 AM  07/05/2024   I saw and evaluated the patient. I reviewed the trainee's note and agree. I performed the service or was physically present during the critical or key portions of the service furnished by  the trainee; and I managed the patient.  If CT shows stable to responsive disease, pt may be candidate for combined Whipple and R partial hepatectomy to improve overall survival.  Abran Kallman, MD 07/11/2024       [1] Meds Ordered in [facility]  Medication Sig Dispense Refill  . ALPRAZolam  (XANAX ) 1 mg tablet TAKE 1 TABLET BY MOUTH EVERY DAY AS NEEDED FOR ANXIETY OR PANIC ATTACKS    . amLODIPine  (NORVASC ) 5 mg tablet Take 5 mg by mouth Once Daily.    . aspirin  81 mg EC tablet Take 81 mg by mouth.    . diclofenac  sodium (VOLTAREN ) 1 % gel  (Patient not taking: Reported on 08/12/2023)    . docusate sodium (COLACE) 100 mg capsule Take 100 mg by mouth.    . ergocalciferol  (VITAMIN D2) 1,250 mcg (50,000 unit) capsule TAKE 1 CAPLET BY MOUTH ONCE A WEEK    . famotidine (PEPCID) 40 mg tablet Take 40 mg by mouth 2 (two) times a day for 90 days. 180 tablet 1  . gabapentin  (NEURONTIN ) 300 mg capsule Take 300 mg by mouth in the morning and 300 mg at noon and 300 mg in the evening.    . hydroCHLOROthiazide (HYDRODIURIL) 12.5 mg capsule  (Patient not taking: Reported on 08/12/2023)    . meloxicam  (MOBIC ) 15 mg tablet TAKE 1 TABLET BY MOUTH EVERY EVENING WITH FOOD    . moxifloxacin (VIGAMOX) 0.5 % ophthalmic solution INSTILL 1 DROP IN RIGHT EYE FOUR TIMES DAILY    . Ozempic 2 mg/dose (8 mg/3 mL) pnij     . phentermine 37.5 mg cap Take 37.5 mg by mouth Once Daily. (Patient not taking: Reported on 08/12/2023)    . rosuvastatin (CRESTOR) 40 mg tablet Take 40 mg by mouth Once Daily.    [name] tiZANidine (ZANAFLEX) 4 mg tablet Take 4 mg by mouth  nightly as needed.    . traZODone (DESYREL) 150 mg tablet Take 150 mg by mouth nightly as needed.     No current Epic-ordered facility-administered medications on file.  [2] Patient Active Problem List Diagnosis  . Breast cancer (HCC)  . Symptomatic mammary hypertrophy  . Postoperative breast asymmetry  . Melasma  . Rhytides  . Nonrheumatic tricuspid valve regurgitation  . Essential hypertension  . Mixed hyperlipidemia  . Tinnitus of both ears  . Presbycusis of both ears  . Laryngopharyngeal reflux (LPR)  . Facial skin atrophy  . Snoring  . Skin atrophy  . Cough  . Mild obstructive sleep apnea  . Morbid obesity with BMI of 40.0-44.9, adult (HCC)  [3] Allergies Allergen Reactions  . Codeine Hives  . Adhesive Other (See Comments)    SKIN IRRITATION  . Surgical Tape Other (See Comments)    SKIN IRRITATION  [4] Past Surgical History: Procedure Laterality Date  . BREAST LUMPECTOMY     Procedure: BREAST LUMPECTOMY; left side  . CATARACT EXTRACTION     Procedure: CATARACT EXTRACTION  . CORNEAL TRANSPLANT     Procedure: CORNEAL TRANSPLANT; IOL OD x 11/13 Dr. Lamar LITTIE Gaudy   . KNEE SURGERY     Procedure: KNEE SURGERY  . LYMPH NODE DISSECTION     Procedure: LYMPH NODE DISSECTION  . REFRACTIVE SURGERY     Procedure: REFRACTIVE SURGERY; both eyes in [place]    . TONSILLECTOMY     Procedure: TONSILLECTOMY  [5] Past Medical History: Diagnosis Date  . Breast cancer    (CMD)   . Cataract    OS   .  Depression   . High cholesterol   . 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 Hypertension   . Sleep disorder   . Thyroid  disease   [6] Family History Problem Relation Name Age of Onset  . Hypertension Father    . Heart disease Father    . Stroke Mother    . Prostate cancer Brother    . Diabetes Neg Hx    . Psoriasis Neg Hx    . Eczema Neg Hx    . Colon cancer Neg Hx

## 2024-07-07 ENCOUNTER — Inpatient Hospital Stay

## 2024-07-07 NOTE — Progress Notes (Signed)
 [facility] CSW Counseling Note  Patient was referred by self. Treatment type: Individual   Presenting Concerns: Patient and/or family reports the following symptoms/concerns: depression and stress Duration of problem: since diagnosis approx 2 months ago ; Severity of problem: mild   Orientation:oriented to person, place, and time/date.   Affect: Appropriate and Congruent Risk of harm to self or others: No plan to harm self or others  Patient and/or Family's Strengths/Protective Factors: Social connections, Social and Emotional competence, Concrete supports in place (healthy food, safe environments, etc.), and ability for insight.Active sense of humor  Average or above average intelligence  Capable of independent living  Communication skills  General fund of knowledge  Motivation for treatment/growth  Religious Affiliation  Supportive family/friends      Goals Addressed: Patient will:  Reduce symptoms of: depression and stress  Increase knowledge and/or ability of:  coping skills and self-management skills.  Increase healthy adjustment to current life circumstances    Progress towards Goals: Progressing   Interventions: Interventions utilized:  Solution-Focused Strategies, Behavioral Activation, Supportive Counseling, Communication Skills, and Supportive Reflection  GAD 7 PHQ 9     07/02/2024    1:47 PM 06/30/2024    9:00 AM 06/15/2024   10:12 AM 06/11/2024    4:12 PM 06/01/2024   11:00 AM  Depression screen PHQ 2/9  Decreased Interest 1 1 1 3 2   Down, Depressed, Hopeless 1 1 1 3 2   PHQ - 2 Score 2 2 2 6 4   Altered sleeping  1 0 0 2  Tired, decreased energy  1 1 3 1   Change in appetite  1 0 1 1  Feeling bad or failure about yourself   0 0 1 0  Trouble concentrating  0 0 1 0  Moving slowly or fidgety/restless  0 0 0 0  Suicidal thoughts  0 0 0 0  PHQ-9 Score  5 3 12 8   Difficult doing work/chores    Somewhat difficult          06/11/2024    4:10 PM 08/09/2019     4:34 PM  GAD 7 : Generalized Anxiety Score  Nervous, Anxious, on Edge 2 2  Control/stop worrying 2 3  Worry too much - different things 1 2  Trouble relaxing 0 2  Restless 0 0  Easily annoyed or irritable 1 3  Afraid - awful might happen 1 3  Total GAD 7 Score 7 15  Anxiety Difficulty Somewhat difficult        Assessment:  Patient reported that she followed up with multiple people in her life since last counseling session, notifying them of her diagnosis. She's finding that they are supportive and eager to be of assistance, and is working with Intern to overcome discomfort with asking for help when needed. Intern encouraged pt to think about it from her friends' perspective, and pt agreed that she would want to know if someone she cared about was struggling and have the opportunity to offer assistance if she was able.   Pt expressed concern that one relationship in her life where boundaries are frequently crossed is causing enough distress to be negatively impacting her physical health. Pt and Intern role-played conversations to assist in reinforcing pt's autonomy and problem-solved how to shift relationship dynamics. Intern then guided pt's focus away from what if's to instead focus on and affirm the progress pt is making.  Pt's says her energy has been better this week and she was able to visit her sister  who is in assisted living, and get out to run some errands. However, after a good day pt says she begins to wonder how many more good days she will have, and what will next year be?[name] Intern offered supportive listening and normalized feelings of sadness and anxiety when faced with uncertainty. Pt and Intern explored mindfulness techniques for focusing on the present, such as gratitude practice, and Intern encouraged acceptance of difficult emotions when they arise, remembering they are temporary.    Plan: Follow up with CSW: In 2 weeks Behavioral recommendations: Continue to build  support network by reaching out to friends/ family and sharing news of Dx when pt feels ready/ asking for help when needed; Communicate boundaries regarding when pt is/ isn't able to have visitors in the home; gratitude practice Referral(s): Support group(s):  Pt attended GI Support Group on 06/16/24 and plans to join again next month.       Thersia KATHEE Daring Clinical Social Work Intern

## 2024-07-08 ENCOUNTER — Telehealth: Payer: Self-pay | Admitting: *Deleted

## 2024-07-08 ENCOUNTER — Encounter: Payer: Self-pay | Admitting: *Deleted

## 2024-07-08 NOTE — Progress Notes (Signed)
 I reviewed patient visit with social work Tax inspector. I concur with the treatment plan as documented in the SW intern's note.   Analis Distler E Siaosi Alter, LCSW Clinical Child psychotherapist

## 2024-07-08 NOTE — Telephone Encounter (Signed)
 Attempted to return phone call and unable to leave message due to mailbox full

## 2024-07-09 ENCOUNTER — Other Ambulatory Visit: Payer: Self-pay | Admitting: Oncology

## 2024-07-09 DIAGNOSIS — C179 Malignant neoplasm of small intestine, unspecified: Secondary | ICD-10-CM

## 2024-07-12 ENCOUNTER — Inpatient Hospital Stay: Admitting: Nutrition

## 2024-07-12 MED ORDER — SODIUM CHLORIDE 0.9 % IV SOLN
1200.0000 mg/m2 | INTRAVENOUS | Status: DC
  Administered 2024-07-13: 2200 mg via INTRAVENOUS
  Filled 2024-07-12: qty 9

## 2024-07-12 NOTE — Progress Notes (Signed)
 Patient requested telephone call. Contacted patient who was appreciative of call. She had a question she wanted to ask but could not remember what she wanted to ask. Reports increased diarrhea and wonders if there are foods she should avoid. Reminded her we discussed the low fiber diet in past consults and I re-educated her on foods to avoid. Provided my office number where she can leave a confidential voice mail with her questions and I can return her call.

## 2024-07-13 ENCOUNTER — Ambulatory Visit: Payer: Self-pay | Admitting: Oncology

## 2024-07-13 ENCOUNTER — Inpatient Hospital Stay

## 2024-07-13 ENCOUNTER — Inpatient Hospital Stay (HOSPITAL_BASED_OUTPATIENT_CLINIC_OR_DEPARTMENT_OTHER): Admitting: Oncology

## 2024-07-13 VITALS — BP 164/82 | HR 72 | Temp 98.1°F | Resp 18

## 2024-07-13 VITALS — BP 129/69 | HR 42 | Temp 97.6°F | Resp 18 | Ht 62.0 in | Wt 167.3 lb

## 2024-07-13 DIAGNOSIS — C179 Malignant neoplasm of small intestine, unspecified: Secondary | ICD-10-CM

## 2024-07-13 DIAGNOSIS — Z5111 Encounter for antineoplastic chemotherapy: Secondary | ICD-10-CM | POA: Diagnosis not present

## 2024-07-13 LAB — CMP (CANCER CENTER ONLY)
ALT: 26 U/L (ref 0–44)
AST: 33 U/L (ref 15–41)
Albumin: 4.1 g/dL (ref 3.5–5.0)
Alkaline Phosphatase: 217 U/L — ABNORMAL HIGH (ref 38–126)
Anion gap: 11 (ref 5–15)
BUN: 10 mg/dL (ref 8–23)
CO2: 22 mmol/L (ref 22–32)
Calcium: 9.6 mg/dL (ref 8.9–10.3)
Chloride: 108 mmol/L (ref 98–111)
Creatinine: 0.8 mg/dL (ref 0.44–1.00)
GFR, Estimated: >60 mL/min (ref >60)
Glucose, Bld: 116 mg/dL — ABNORMAL HIGH (ref 70–99)
Potassium: 3.4 mmol/L — ABNORMAL LOW (ref 3.5–5.1)
Sodium: 140 mmol/L (ref 135–145)
Total Bilirubin: 0.2 mg/dL (ref 0.0–1.2)
Total Protein: 6.8 g/dL (ref 6.5–8.1)

## 2024-07-13 LAB — CBC WITH DIFFERENTIAL (CANCER CENTER ONLY)
Abs Immature Granulocytes: 0.07 K/uL (ref 0.00–0.07)
Basophils Absolute: 0.1 K/uL (ref 0.0–0.1)
Basophils Relative: 1 %
Eosinophils Absolute: 0.1 K/uL (ref 0.0–0.5)
Eosinophils Relative: 2 %
HCT: 32.3 % — ABNORMAL LOW (ref 36.0–46.0)
Hemoglobin: 9.9 g/dL — ABNORMAL LOW (ref 12.0–15.0)
Immature Granulocytes: 1 %
Lymphocytes Relative: 25 %
Lymphs Abs: 1.6 K/uL (ref 0.7–4.0)
MCH: 25.3 pg — ABNORMAL LOW (ref 26.0–34.0)
MCHC: 30.7 g/dL (ref 30.0–36.0)
MCV: 82.4 fL (ref 80.0–100.0)
Monocytes Absolute: 0.4 K/uL (ref 0.1–1.0)
Monocytes Relative: 6 %
Neutro Abs: 4.1 K/uL (ref 1.7–7.7)
Neutrophils Relative %: 65 %
Platelet Count: 155 K/uL (ref 150–400)
RBC: 3.92 MIL/uL (ref 3.87–5.11)
RDW: 20.1 % — ABNORMAL HIGH (ref 11.5–15.5)
WBC Count: 6.2 K/uL (ref 4.0–10.5)
nRBC: 0 % (ref 0.0–0.2)

## 2024-07-13 LAB — CEA (ACCESS): CEA (CHCC): 50.05 ng/mL — ABNORMAL HIGH (ref 0.00–5.00)

## 2024-07-13 MED ORDER — DEXAMETHASONE SOD PHOSPHATE PF 10 MG/ML IJ SOLN
10.0000 mg | Freq: Once | INTRAMUSCULAR | Status: AC
  Administered 2024-07-13: 10 mg via INTRAVENOUS

## 2024-07-13 MED ORDER — PALONOSETRON HCL INJECTION 0.25 MG/5ML
0.2500 mg | Freq: Once | INTRAVENOUS | Status: AC
  Administered 2024-07-13: 0.25 mg via INTRAVENOUS
  Filled 2024-07-13: qty 5

## 2024-07-13 MED ORDER — DEXTROSE 5 % IV SOLN: INTRAVENOUS | Status: DC

## 2024-07-13 MED ORDER — LEUCOVORIN CALCIUM INJECTION 350 MG
400.0000 mg/m2 | Freq: Once | INTRAVENOUS | Status: AC
  Administered 2024-07-13: 736 mg via INTRAVENOUS
  Filled 2024-07-13: qty 36.8

## 2024-07-13 MED ORDER — OXALIPLATIN CHEMO INJECTION 100 MG/20ML
85.0000 mg/m2 | Freq: Once | INTRAVENOUS | Status: AC
  Administered 2024-07-13: 150 mg via INTRAVENOUS
  Filled 2024-07-13: qty 20

## 2024-07-13 NOTE — Patient Instructions (Addendum)
 Implanted [facility] Guide An implanted port is a device that is placed under the skin. It is usually placed in the chest. The device may vary based on the need. Implanted ports can be used to give IV medicine, to take blood, or to give fluids. You may have an implanted port if: You need IV medicine that would be irritating to the small veins in your hands or arms. You need IV medicines, such as chemotherapy, for a long period of time. You need IV nutrition for a long period of time. You may have fewer limitations when using a port than you would if you used other types of long-term IVs. You will also likely be able to return to normal activities after your incision heals. An implanted port has two main parts: Reservoir. The reservoir is the part where a needle is inserted to give medicines or draw blood. The reservoir is round. After the port is placed, it appears as a small, raised area under your skin. Catheter. The catheter is a small, thin tube that connects the reservoir to a vein. Medicine that is inserted into the reservoir goes into the catheter and then into the vein. How is my port accessed? To access your port: A numbing cream may be placed on the skin over the port site. Your health care provider will put on a mask and sterile gloves. The skin over your port will be cleaned carefully with a germ-killing soap and allowed to dry. Your health care provider will gently pinch the port and insert a needle into it. Your health care provider will check for a blood return to make sure the port is in the vein and is still working (patent). If your port needs to remain accessed to get medicine continuously (constant infusion), your health care provider will place a clear bandage (dressing) over the needle site. The dressing and needle will need to be changed every week, or as told by your health care provider. What is flushing? Flushing helps keep the port working. Follow instructions from your  health care provider about how and when to flush the port. Ports are usually flushed with saline solution or a medicine called heparin . The need for flushing will depend on how the port is used: If the port is only used from time to time to give medicines or draw blood, the port may need to be flushed: Before and after medicines have been given. Before and after blood has been drawn. As part of routine maintenance. Flushing may be recommended every 4-6 weeks. If a constant infusion is running, the port may not need to be flushed. Throw away any syringes in a disposal container that is meant for sharp items (sharps container). You can buy a sharps container from a pharmacy, or you can make one by using an empty hard plastic bottle with a cover. How long will my port stay implanted? The port can stay in for as long as your health care provider thinks it is needed. When it is time for the port to come out, a surgery will be done to remove it. The surgery will be similar to the procedure that was done to put the port in. Follow these instructions at home: Caring for your port and port site Flush your port as told by your health care provider. If you need an infusion over several days, follow instructions from your health care provider about how to take care of your port site. Make sure you: Change your  dressing as told by your health care provider. Wash your hands with soap and water for at least 20 seconds before and after you change your dressing. If soap and water are not available, use alcohol-based hand sanitizer. Place any used dressings or infusion bags into a plastic bag. Throw that bag in the trash. Keep the dressing that covers the needle clean and dry. Do not get it wet. Do not use scissors or sharp objects near the infusion tubing. Keep any external tubes clamped, unless they are being used. Check your port site every day for signs of infection. Check for: Redness, swelling, or  pain. Fluid or blood. Warmth. Pus or a bad smell. Protect the skin around the port site. Avoid wearing bra straps that rub or irritate the site. Protect the skin around your port from seat belts. Place a soft pad over your chest if needed. Bathe or shower as told by your health care provider. The site may get wet as long as you are not actively receiving an infusion. General instructions  Return to your normal activities as told by your health care provider. Ask your health care provider what activities are safe for you. Carry a medical alert card or wear a medical alert bracelet at all times. This will let health care providers know that you have an implanted port in case of an emergency. Where to find more information American Cancer Society: www.cancer.org American Society of Clinical Oncology: www.cancer.net Contact a health care provider if: You have a fever or chills. You have redness, swelling, or pain at the port site. You have fluid or blood coming from your port site. Your incision feels warm to the touch. You have pus or a bad smell coming from the port site. Summary Implanted ports are usually placed in the chest for long-term IV access. Follow instructions from your health care provider about flushing the port and changing bandages (dressings). Take care of the area around your port by avoiding clothing that puts pressure on the area, and by watching for signs of infection. Protect the skin around your port from seat belts. Place a soft pad over your chest if needed. Contact a health care provider if you have a fever or you have redness, swelling, pain, fluid, or a bad smell at the port site. This information is not intended to replace advice given to you by your health care provider. Make sure you discuss any questions you have with your health care provider. Document Revised: 03/06/2021 Document Reviewed: 03/06/2021 Elsevier Patient Education  2024 Elsevier Inc.[facility] - A DEPT OF [facility]  Discharge Instructions: Thank you for choosing [facility] to provide your oncology and hematology care.   If you have a lab appointment with the Cancer Center, please go directly to the Cancer Center and check in at the registration area.   Wear comfortable clothing and clothing appropriate for easy access to any Portacath or PICC line.   We strive to give you quality time with your provider. You may need to reschedule your appointment if you arrive late (15 or more minutes).  Arriving late affects you and other patients whose appointments are after yours.  Also, if you miss three or more appointments without notifying the office, you may be dismissed from the clinic at the provider's discretion.      For prescription refill requests, have your pharmacy contact our office and allow 72 hours for refills to be completed.  Today you received the following chemotherapy and/or immunotherapy agents: oxaliplatin , leucovorin , fluorouracil        To help prevent nausea and vomiting after your treatment, we encourage you to take your nausea medication as directed.  BELOW ARE SYMPTOMS THAT SHOULD BE REPORTED IMMEDIATELY: *FEVER GREATER THAN 100.4 F (38 C) OR HIGHER *CHILLS OR SWEATING *NAUSEA AND VOMITING THAT IS NOT CONTROLLED WITH YOUR NAUSEA MEDICATION *UNUSUAL SHORTNESS OF BREATH *UNUSUAL BRUISING OR BLEEDING *URINARY PROBLEMS (pain or burning when urinating, or frequent urination) *BOWEL PROBLEMS (unusual diarrhea, constipation, pain near the anus) TENDERNESS IN MOUTH AND THROAT WITH OR WITHOUT PRESENCE OF ULCERS (sore throat, sores in mouth, or a toothache) UNUSUAL RASH, SWELLING OR PAIN  UNUSUAL VAGINAL DISCHARGE OR ITCHING   Items with * indicate a potential emergency and should be followed up as soon as possible or go to the Emergency Department if any problems should occur.  Please show the CHEMOTHERAPY ALERT  CARD or IMMUNOTHERAPY ALERT CARD at check-in to the Emergency Department and triage nurse.  Should you have questions after your visit or need to cancel or reschedule your appointment, please contact [facility] - A DEPT OF [facility]  Dept: [phone]  and follow the prompts.  Office hours are 8:00 a.m. to 4:30 p.m. Monday - Friday. Please note that voicemails left after 4:00 p.m. may not be returned until the following business day.  We are closed weekends and major holidays. You have access to a nurse at all times for urgent questions. Please call the main number to the clinic Dept: [phone] and follow the prompts.   For any non-urgent questions, you may also contact your provider using MyChart. We now offer e-Visits for anyone 17 and older to request care online for non-urgent symptoms. For details visit [email or web address].   Also download the MyChart app! Go to the app store, search MyChart, open the app, select [place], and log in with your MyChart username and password.

## 2024-07-13 NOTE — Progress Notes (Signed)
 [facility] CSW Progress Note  Visual Merchandiser met with patient during infusion treatment to discuss SNAP benefit Application. CSW informed patient SNAP application has not been completed and returned. CSW provided blank SNAP application for patient to review during treatment. Patient declined to submit application for SNAP Benefits. Patient stated she does not believe she would meet eligibility. Patient's caregiver stated she supports patient and self by purchasing groceries if needed.     Follow Up Plan:  CSW will speak with patient following scans.     Lizbeth Sprague, LCSW Clinical Social Worker [facility]

## 2024-07-13 NOTE — Progress Notes (Signed)
 Patient seen by Dr. Arley Hof today  Vitals are within treatment parameters:No (Please specify and give further instructions.) Pulse 42--bigeminy per MD. OK to proceed. Sees cardiology soon  Labs are within treatment parameters: Yes   Treatment plan has been signed: Yes   Per physician team, Patient is ready for treatment and there are NO modifications to the treatment plan.

## 2024-07-13 NOTE — Progress Notes (Signed)
 [facility] OFFICE PROGRESS NOTE   Diagnosis: Duodenal carcinoma  INTERVAL HISTORY:   [name] completed another cycle of FOLFOX on 06/30/2024.  She has intermittent diarrhea, but not on a daily basis.  No nausea.  She has mild numbness and tingling in the extremities.  This does not interfere with activity. She saw Dr.Shenn last week.  He recommends restaging CTs after cycle 6 FOLFOX.  She is scheduled for a follow-up appointment at [facility] on 07/30/2024. She has noted darkening of the hands. Objective:  Vital signs in last 24 hours:  Blood pressure 129/69, pulse (!) 44, temperature 97.6 F (36.4 C), temperature source Temporal, resp. rate 18, height 5' 2 (1.575 m), weight 167 lb 4.8 oz (75.9 kg), SpO2 96%.    HEENT: No thrush or ulcers Resp: Lungs clear bilaterally Cardio: Regular rate and rhythm with grouped beats GI: No hepatosplenomegaly, nontender, no mass Vascular: No leg edema Neuro: Mild loss of vibratory sense at the fingertips bilaterally Skin: Mild hyperpigmentation of the hands  Portacath/PICC-without erythema  Lab Results:  Lab Results  Component Value Date   WBC 6.2 07/13/2024   HGB 9.9 (L) 07/13/2024   HCT 32.3 (L) 07/13/2024   MCV 82.4 07/13/2024   PLT 155 07/13/2024   NEUTROABS 4.1 07/13/2024    CMP  Lab Results  Component Value Date   NA 140 06/30/2024   K 3.5 06/30/2024   CL 106 06/30/2024   CO2 21 (L) 06/30/2024   GLUCOSE 116 (H) 06/30/2024   BUN 8 06/30/2024   CREATININE 0.86 06/30/2024   CALCIUM  10.0 06/30/2024   PROT 6.9 06/30/2024   ALBUMIN 4.1 06/30/2024   AST 44 (H) 06/30/2024   ALT 46 (H) 06/30/2024   ALKPHOS 213 (H) 06/30/2024   BILITOT 0.3 06/30/2024   GFRNONAA >60 06/30/2024   GFRAA 75 09/26/2020    Lab Results  Component Value Date   CEA 71.93 (H) 06/30/2024     Medications: I have reviewed the patient's current medications.   Assessment/Plan: Duodenal carcinoma 04/04/2024-CT  abdomen/pelvis: Heterogenous partially exophytic mass in the posterior dome of the right liver, heterogenous low-attenuation mass in the descending duodenum with extraluminal extension 04/06/2024 upper endoscopy: Large mass in the second portion of the duodenum, completely obstructing, ulcerated.  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  Peripheral hypermetabolism in the posterior right hepatic lesion.  Several tiny bilateral pulmonary nodules measuring 3 to 5 mm, too small to characterize by PET.  Scattered foci of hypermetabolism along the length of the colon, nonspecific. Cycle 1 FOLFOX 04/27/2024 Treatment held 05/10/2024 due to neutropenia (ANC 0.7) Cycle 2 FOLFOX 05/18/2024, Udenyca  Cycle 3 FOLFOX 06/02/2024, Udenyca  Cycle 4 FOLFOX 06/15/2024, 5-FU bolus eliminated, Udenyca  Cycle 5 FOLFOX 06/30/2024, 5-FU pump dose reduced due to diarrhea, Udenyca  Cycle 6 FOLFOX 07/05/2024, Udenyca  Stage I left breast cancer 1995, status post lumpectomy and left axillary dissection, adjuvant chemotherapy, radiation, and a visa Hypertension Hypothyroidism Osteoarthritis Normocytic anemia July 2025 Anorexia/weight loss secondary to #1 Anxiety/depression Family history of prostate cancer    Disposition: Elaine Simon has completed 5 cycles of FOLFOX.  She will complete cycle 6 FOLFOX today.  She has mild oxaliplatin  neuropathy.  She  will undergo a restaging CT evaluation at [facility] after this cycle.  She is scheduled for a  follow-up appoint with Dr. Kermitt on 07/30/2024.  She will return for an office visit here on 08/04/2024. She has grouped beats on physical exam today.  An EKG 06/01/2024 revealed sinus rhythm with a bigeminy pattern.  She is scheduled for an echocardiogram.  I doubt the premature contractions are related to chemotherapy. Arley Hof, MD  07/13/2024  9:35 AM

## 2024-07-13 NOTE — Patient Instructions (Signed)
 [facility] - A DEPT OF [facility]  Discharge Instructions: Thank you for choosing [facility] to provide your oncology and hematology care.   If you have a lab appointment with the Cancer Center, please go directly to the Cancer Center and check in at the registration area.   Wear comfortable clothing and clothing appropriate for easy access to any Portacath or PICC line.   We strive to give you quality time with your provider. You may need to reschedule your appointment if you arrive late (15 or more minutes).  Arriving late affects you and other patients whose appointments are after yours.  Also, if you miss three or more appointments without notifying the office, you may be dismissed from the clinic at the provider's discretion.      For prescription refill requests, have your pharmacy contact our office and allow 72 hours for refills to be completed.    Today you received the following chemotherapy and/or immunotherapy agents: oxaliplatin , leucovorin , fluorouracil        To help prevent nausea and vomiting after your treatment, we encourage you to take your nausea medication as directed.  BELOW ARE SYMPTOMS THAT SHOULD BE REPORTED IMMEDIATELY: *FEVER GREATER THAN 100.4 F (38 C) OR HIGHER *CHILLS OR SWEATING *NAUSEA AND VOMITING THAT IS NOT CONTROLLED WITH YOUR NAUSEA MEDICATION *UNUSUAL SHORTNESS OF BREATH *UNUSUAL BRUISING OR BLEEDING *URINARY PROBLEMS (pain or burning when urinating, or frequent urination) *BOWEL PROBLEMS (unusual diarrhea, constipation, pain near the anus) TENDERNESS IN MOUTH AND THROAT WITH OR WITHOUT PRESENCE OF ULCERS (sore throat, sores in mouth, or a toothache) UNUSUAL RASH, SWELLING OR PAIN  UNUSUAL VAGINAL DISCHARGE OR ITCHING   Items with * indicate a potential emergency and should be followed up as soon as possible or go to the Emergency Department if any problems should occur.  Please show the CHEMOTHERAPY  ALERT CARD or IMMUNOTHERAPY ALERT CARD at check-in to the Emergency Department and triage nurse.  Should you have questions after your visit or need to cancel or reschedule your appointment, please contact [facility] - A DEPT OF [facility]  Dept: [phone]  and follow the prompts.  Office hours are 8:00 a.m. to 4:30 p.m. Monday - Friday. Please note that voicemails left after 4:00 p.m. may not be returned until the following business day.  We are closed weekends and major holidays. You have access to a nurse at all times for urgent questions. Please call the main number to the clinic Dept: [phone] and follow the prompts.   For any non-urgent questions, you may also contact your provider using MyChart. We now offer e-Visits for anyone 22 and older to request care online for non-urgent symptoms. For details visit [email or web address].   Also download the MyChart app! Go to the app store, search MyChart, open the app, select [facility], and log in with your MyChart username and password.

## 2024-07-14 ENCOUNTER — Ambulatory Visit (HOSPITAL_BASED_OUTPATIENT_CLINIC_OR_DEPARTMENT_OTHER)
Admission: RE | Admit: 2024-07-14 | Discharge: 2024-07-14 | Disposition: A | Discharge status: Home / Self Care | Source: Ambulatory Visit | Attending: Physician Assistant | Admitting: Physician Assistant

## 2024-07-14 ENCOUNTER — Inpatient Hospital Stay

## 2024-07-14 ENCOUNTER — Other Ambulatory Visit: Payer: Self-pay

## 2024-07-14 DIAGNOSIS — I493 Ventricular premature depolarization: Secondary | ICD-10-CM | POA: Diagnosis not present

## 2024-07-14 DIAGNOSIS — Z5111 Encounter for antineoplastic chemotherapy: Secondary | ICD-10-CM | POA: Diagnosis not present

## 2024-07-14 LAB — ECHOCARDIOGRAM COMPLETE
Area-P 1/2: 3.7 cm2
S' Lateral: 2.67 cm

## 2024-07-14 NOTE — Progress Notes (Signed)
 [facility] CSW Progress Note  Clinical Social Work Intern contacted patient by phone to follow-up on emotional support.    Interventions: Provided brief mental health counseling with regard to strain in personal relationships that is causing a lot of stress, on top of illness-related concerns. Pt and Intern revisited problem-solving strategies regarding boundary setting, such as establishing specific times for visitors. Pt and Intern also discussed coping skills for reducing anxiety, such as spending intentional time outdoors, even if it's just sitting on the porch, and practicing deep breathing while tapping into the five senses.  Sent message to Sari Modena NP at [facility] regarding treatment related questions per patient request Reminded patient of upcoming [facility] appointments and GI Cancer Support Group on 07/21/24.      Follow Up Plan:  CSW will see patient on 07/21/24.    Thersia KATHEE Daring Clinical Social Work Intern [facility]

## 2024-07-14 NOTE — Progress Notes (Signed)
 I reviewed patient visit by social work Tax inspector. I concur with the treatment plan as documented in the SW intern's note.   Nazareth Norenberg E Lylianna Fraiser, LCSW Clinical Child psychotherapist

## 2024-07-15 ENCOUNTER — Ambulatory Visit

## 2024-07-15 VITALS — BP 141/78 | HR 86 | Temp 98.1°F | Resp 18

## 2024-07-15 DIAGNOSIS — Z5111 Encounter for antineoplastic chemotherapy: Secondary | ICD-10-CM | POA: Diagnosis not present

## 2024-07-15 DIAGNOSIS — C179 Malignant neoplasm of small intestine, unspecified: Secondary | ICD-10-CM

## 2024-07-15 MED ORDER — PEGFILGRASTIM-CBQV 6 MG/0.6ML ~~LOC~~ SOSY
6.0000 mg | PREFILLED_SYRINGE | Freq: Once | SUBCUTANEOUS | Status: AC
  Administered 2024-07-15: 6 mg via SUBCUTANEOUS
  Filled 2024-07-15: qty 0.6

## 2024-07-15 NOTE — Patient Instructions (Signed)

## 2024-07-20 ENCOUNTER — Ambulatory Visit: Payer: Self-pay | Admitting: Physician Assistant

## 2024-07-21 ENCOUNTER — Inpatient Hospital Stay: Attending: Nurse Practitioner

## 2024-07-21 DIAGNOSIS — G629 Polyneuropathy, unspecified: Secondary | ICD-10-CM | POA: Insufficient documentation

## 2024-07-21 DIAGNOSIS — F32A Depression, unspecified: Secondary | ICD-10-CM | POA: Insufficient documentation

## 2024-07-21 DIAGNOSIS — M1990 Unspecified osteoarthritis, unspecified site: Secondary | ICD-10-CM | POA: Insufficient documentation

## 2024-07-21 DIAGNOSIS — Z853 Personal history of malignant neoplasm of breast: Secondary | ICD-10-CM | POA: Insufficient documentation

## 2024-07-21 DIAGNOSIS — Z5189 Encounter for other specified aftercare: Secondary | ICD-10-CM | POA: Insufficient documentation

## 2024-07-21 DIAGNOSIS — Z5111 Encounter for antineoplastic chemotherapy: Secondary | ICD-10-CM | POA: Insufficient documentation

## 2024-07-21 DIAGNOSIS — F419 Anxiety disorder, unspecified: Secondary | ICD-10-CM | POA: Insufficient documentation

## 2024-07-21 DIAGNOSIS — R918 Other nonspecific abnormal finding of lung field: Secondary | ICD-10-CM | POA: Insufficient documentation

## 2024-07-21 DIAGNOSIS — K769 Liver disease, unspecified: Secondary | ICD-10-CM | POA: Insufficient documentation

## 2024-07-21 DIAGNOSIS — R197 Diarrhea, unspecified: Secondary | ICD-10-CM | POA: Insufficient documentation

## 2024-07-21 DIAGNOSIS — Z8042 Family history of malignant neoplasm of prostate: Secondary | ICD-10-CM | POA: Insufficient documentation

## 2024-07-21 DIAGNOSIS — Z79899 Other long term (current) drug therapy: Secondary | ICD-10-CM | POA: Insufficient documentation

## 2024-07-21 DIAGNOSIS — I10 Essential (primary) hypertension: Secondary | ICD-10-CM | POA: Insufficient documentation

## 2024-07-21 DIAGNOSIS — R630 Anorexia: Secondary | ICD-10-CM | POA: Insufficient documentation

## 2024-07-21 DIAGNOSIS — C170 Malignant neoplasm of duodenum: Secondary | ICD-10-CM | POA: Insufficient documentation

## 2024-07-21 DIAGNOSIS — D709 Neutropenia, unspecified: Secondary | ICD-10-CM | POA: Insufficient documentation

## 2024-07-21 DIAGNOSIS — E039 Hypothyroidism, unspecified: Secondary | ICD-10-CM | POA: Insufficient documentation

## 2024-07-21 NOTE — Progress Notes (Unsigned)
 [facility] CSW Counseling Note  Patient was referred by self. Treatment type: Individual   Presenting Concerns: Patient and/or family reports the following symptoms/concerns: depression and stress Duration of problem: since diagnosis approx 3 months ago ; Severity of problem: moderate   Orientation:oriented to person, place, and time/date.   Affect: Appropriate and Congruent Risk of harm to self or others: No plan to harm self or others  Patient and/or Family's Strengths/Protective Factors: Social connections, Social and Emotional competence, Concrete supports in place (healthy food, safe environments, etc.), and ability for insight.Active sense of humor  Average or above average intelligence  Capable of independent living  Communication skills  General fund of knowledge  Motivation for treatment/growth  Religious Affiliation  Supportive family/friends      Goals Addressed: Patient will:  Reduce symptoms of: depression and stress  Increase knowledge and/or ability of:  coping skills and self-management skills.  Increase healthy adjustment to current life circumstances    Progress towards Goals: Progressing   Interventions: Interventions utilized:  Solution-Focused Strategies, Behavioral Activation, Supportive Counseling, Communication Skills, and Supportive Reflection      07/13/2024    9:34 AM 07/02/2024    1:47 PM 06/30/2024    9:00 AM 06/15/2024   10:12 AM 06/11/2024    4:12 PM  Depression screen PHQ 2/9  Decreased Interest 1 1 1 1 3   Down, Depressed, Hopeless 1 1 1 1 3   PHQ - 2 Score 2 2 2 2 6   Altered sleeping 0  1 0 0  Tired, decreased energy 1  1 1 3   Change in appetite 0  1 0 1  Feeling bad or failure about yourself  0  0 0 1  Trouble concentrating 0  0 0 1  Moving slowly or fidgety/restless 0  0 0 0  Suicidal thoughts 0  0 0 0  PHQ-9 Score 3  5 3 12   Difficult doing work/chores     Somewhat difficult         06/11/2024    4:10 PM 08/09/2019    4:34 PM   GAD 7 : Generalized Anxiety Score  Nervous, Anxious, on Edge 2 2  Control/stop worrying 2 3  Worry too much - different things 1 2  Trouble relaxing 0 2  Restless 0 0  Easily annoyed or irritable 1 3  Afraid - awful might happen 1 3  Total GAD 7 Score 7 15  Anxiety Difficulty Somewhat difficult        Assessment:  Patient reports increased anxiety today due to interpersonal challenges and making a decision regarding whether to have the whipple procedure. Patient states she has multiple appointments in next few weeks to discuss options with providers. Patient and CSW intern strategized ways to support patient in making the decision, such as bringing a trusted friend to the appts who can take notes, writing down questions ahead of time, speaking with a peer survivor who has had the procedure, and requesting additional informational resources from providers. Patient and intern brainstormed continued ways to build and strengthen pt's support network, setting goals for pt to have conversations with family and friends regarding needs.  Patient reflected on her desire to travel and how this may impact her decision regarding surgery. Pt and intern explored pt's values and wishes for her life and discussed present and future actions she can take to align with those values. Patient expressed ongoing interpersonal tension re her boundaries not being respected. Patient and intern problem solved practical ways to  establish and support pt's autonomy and independence while still getting her needs met.      Plan: Follow up with CSW: In 2 weeks Behavioral recommendations: Continue to build support network by reaching out to friends/ family and sharing news of Dx/ asking for help when needed; Communicate boundaries regarding when pt is/ isn't able to have visitors in the home; Grounding practices discussed in session: time in nature, prayer, breath meditation. Referral(s): Support group(s):  GI Support Group  on 07/21/24.       Thersia KATHEE Daring Clinical Social Work Intern [facility]

## 2024-07-22 ENCOUNTER — Encounter: Payer: Self-pay | Admitting: Oncology

## 2024-07-22 ENCOUNTER — Inpatient Hospital Stay: Admitting: Nutrition

## 2024-07-22 NOTE — Progress Notes (Signed)
 Patient called with questions regarding foods allowed on low fiber diet. Requests another copy of diet. Will send in regular mail as the email copy provided in the past was difficult for her to open. Her questions were answered to her satisfaction.

## 2024-07-22 NOTE — Progress Notes (Signed)
 I reviewed patient visit by social work Tax inspector. I concur with the treatment plan as documented in the SW intern's note.   Nazareth Norenberg E Lylianna Fraiser, LCSW Clinical Child psychotherapist

## 2024-07-26 ENCOUNTER — Inpatient Hospital Stay: Admitting: Licensed Clinical Social Worker

## 2024-07-27 NOTE — Telephone Encounter (Signed)
 Cancel 11/18 RPV.    Schedule RPV in 3 months with CT scan and labs.

## 2024-07-27 NOTE — Progress Notes (Signed)
 07/27/2024 [facility] Tumor Board Discussion Presenter:  Dr. 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 Uptake in caudate on PET.  Has had 6 cycles of chemo with biomarker response. Recommendations:  Continue systemic treatment, consider adding in biologic.

## 2024-07-28 ENCOUNTER — Inpatient Hospital Stay

## 2024-07-28 ENCOUNTER — Inpatient Hospital Stay: Admitting: Nurse Practitioner

## 2024-07-28 ENCOUNTER — Other Ambulatory Visit: Payer: Self-pay

## 2024-07-28 ENCOUNTER — Telehealth: Payer: Self-pay

## 2024-07-28 NOTE — Telephone Encounter (Signed)
 Clinical Social Work Intern attempted to contact patient by phone to schedule next appointment with Hydrologist at [facility]. Intern left VM with direct contact info and encouraged patient to call back.  Thersia Daring Clinical Social Work Intern [facility]

## 2024-07-30 ENCOUNTER — Inpatient Hospital Stay

## 2024-08-01 ENCOUNTER — Other Ambulatory Visit: Payer: Self-pay

## 2024-08-01 ENCOUNTER — Other Ambulatory Visit: Payer: Self-pay | Admitting: Oncology

## 2024-08-04 ENCOUNTER — Inpatient Hospital Stay

## 2024-08-04 NOTE — Progress Notes (Signed)
 I reviewed patient visit by social work Tax inspector. I concur with the treatment plan as documented in the SW intern's note.   Nazareth Norenberg E Lylianna Fraiser, LCSW Clinical Child psychotherapist

## 2024-08-04 NOTE — Progress Notes (Signed)
 [facility] CSW Progress Note  Clinical Social Work Intern contacted patient by phone to follow-up on emotional support. Patient reports she is anxious in anticipation of next round of chemo beginning tomorrow and continued uncertainty regarding feasibility of whipple procedure. Patient states concern re her sister who is in assisted living, as well as other challenging interpersonal dynamics. That along with health concerns have brought on depressive symptoms.   Interventions: Provided brief mental health counseling with regard to fear, uncertainty, and boundary setting.   CSW Intern offered empathetic listening and normalized difficult emotions.  Pt and Intern problem-solved strategies for boundary setting, and brainstormed coping skills to use in challenging times, such as leaning into pt's current practice of prayer and meditation. Discussed multiple dimensions of faith as a protective factor.      Follow Up Plan:  CSW will see patient on 12/3    Thersia KATHEE Daring Clinical Social Work Intern [facility]

## 2024-08-05 ENCOUNTER — Other Ambulatory Visit: Payer: Self-pay

## 2024-08-05 ENCOUNTER — Inpatient Hospital Stay

## 2024-08-05 ENCOUNTER — Inpatient Hospital Stay
Admission: RE | Admit: 2024-08-05 | Discharge: 2024-08-05 | Disposition: A | Payer: Self-pay | Source: Ambulatory Visit | Attending: Nurse Practitioner | Admitting: Nurse Practitioner

## 2024-08-05 ENCOUNTER — Telehealth: Payer: Self-pay

## 2024-08-05 ENCOUNTER — Inpatient Hospital Stay (HOSPITAL_BASED_OUTPATIENT_CLINIC_OR_DEPARTMENT_OTHER): Admitting: Nurse Practitioner

## 2024-08-05 ENCOUNTER — Encounter: Payer: Self-pay | Admitting: Nurse Practitioner

## 2024-08-05 VITALS — BP 140/81 | HR 77 | Temp 97.3°F | Resp 18

## 2024-08-05 VITALS — BP 114/69 | HR 68 | Temp 97.5°F | Resp 18 | Ht 62.0 in | Wt 169.5 lb

## 2024-08-05 DIAGNOSIS — M1990 Unspecified osteoarthritis, unspecified site: Secondary | ICD-10-CM | POA: Diagnosis not present

## 2024-08-05 DIAGNOSIS — Z79899 Other long term (current) drug therapy: Secondary | ICD-10-CM | POA: Diagnosis not present

## 2024-08-05 DIAGNOSIS — D709 Neutropenia, unspecified: Secondary | ICD-10-CM | POA: Diagnosis not present

## 2024-08-05 DIAGNOSIS — Z853 Personal history of malignant neoplasm of breast: Secondary | ICD-10-CM

## 2024-08-05 DIAGNOSIS — C170 Malignant neoplasm of duodenum: Secondary | ICD-10-CM | POA: Diagnosis present

## 2024-08-05 DIAGNOSIS — F32A Depression, unspecified: Secondary | ICD-10-CM | POA: Diagnosis not present

## 2024-08-05 DIAGNOSIS — Z5111 Encounter for antineoplastic chemotherapy: Secondary | ICD-10-CM | POA: Diagnosis present

## 2024-08-05 DIAGNOSIS — G629 Polyneuropathy, unspecified: Secondary | ICD-10-CM | POA: Diagnosis not present

## 2024-08-05 DIAGNOSIS — C179 Malignant neoplasm of small intestine, unspecified: Secondary | ICD-10-CM

## 2024-08-05 DIAGNOSIS — R197 Diarrhea, unspecified: Secondary | ICD-10-CM | POA: Diagnosis not present

## 2024-08-05 DIAGNOSIS — K769 Liver disease, unspecified: Secondary | ICD-10-CM

## 2024-08-05 DIAGNOSIS — F419 Anxiety disorder, unspecified: Secondary | ICD-10-CM | POA: Diagnosis not present

## 2024-08-05 DIAGNOSIS — I10 Essential (primary) hypertension: Secondary | ICD-10-CM | POA: Diagnosis not present

## 2024-08-05 DIAGNOSIS — R918 Other nonspecific abnormal finding of lung field: Secondary | ICD-10-CM | POA: Diagnosis not present

## 2024-08-05 DIAGNOSIS — E039 Hypothyroidism, unspecified: Secondary | ICD-10-CM | POA: Diagnosis not present

## 2024-08-05 DIAGNOSIS — Z8042 Family history of malignant neoplasm of prostate: Secondary | ICD-10-CM | POA: Diagnosis not present

## 2024-08-05 DIAGNOSIS — R630 Anorexia: Secondary | ICD-10-CM | POA: Diagnosis not present

## 2024-08-05 DIAGNOSIS — Z5189 Encounter for other specified aftercare: Secondary | ICD-10-CM | POA: Diagnosis not present

## 2024-08-05 LAB — CBC WITH DIFFERENTIAL (CANCER CENTER ONLY)
Abs Immature Granulocytes: 0.02 K/uL (ref 0.00–0.07)
Basophils Absolute: 0 K/uL (ref 0.0–0.1)
Basophils Relative: 1 %
Eosinophils Absolute: 0.1 K/uL (ref 0.0–0.5)
Eosinophils Relative: 2 %
HCT: 31.7 % — ABNORMAL LOW (ref 36.0–46.0)
Hemoglobin: 9.7 g/dL — ABNORMAL LOW (ref 12.0–15.0)
Immature Granulocytes: 1 %
Lymphocytes Relative: 34 %
Lymphs Abs: 1.3 K/uL (ref 0.7–4.0)
MCH: 25.5 pg — ABNORMAL LOW (ref 26.0–34.0)
MCHC: 30.6 g/dL (ref 30.0–36.0)
MCV: 83.2 fL (ref 80.0–100.0)
Monocytes Absolute: 0.4 K/uL (ref 0.1–1.0)
Monocytes Relative: 10 %
Neutro Abs: 2 K/uL (ref 1.7–7.7)
Neutrophils Relative %: 52 %
Platelet Count: 218 K/uL (ref 150–400)
RBC: 3.81 MIL/uL — ABNORMAL LOW (ref 3.87–5.11)
RDW: 21.8 % — ABNORMAL HIGH (ref 11.5–15.5)
WBC Count: 3.8 K/uL — ABNORMAL LOW (ref 4.0–10.5)
nRBC: 0 % (ref 0.0–0.2)

## 2024-08-05 LAB — CMP (CANCER CENTER ONLY)
ALT: 20 U/L (ref 0–44)
AST: 31 U/L (ref 15–41)
Albumin: 3.8 g/dL (ref 3.5–5.0)
Alkaline Phosphatase: 187 U/L — ABNORMAL HIGH (ref 38–126)
Anion gap: 11 (ref 5–15)
BUN: 9 mg/dL (ref 8–23)
CO2: 24 mmol/L (ref 22–32)
Calcium: 9.9 mg/dL (ref 8.9–10.3)
Chloride: 107 mmol/L (ref 98–111)
Creatinine: 0.73 mg/dL (ref 0.44–1.00)
GFR, Estimated: >60 mL/min (ref >60)
Glucose, Bld: 120 mg/dL — ABNORMAL HIGH (ref 70–99)
Potassium: 3.7 mmol/L (ref 3.5–5.1)
Sodium: 141 mmol/L (ref 135–145)
Total Bilirubin: 0.3 mg/dL (ref 0.0–1.2)
Total Protein: 7.1 g/dL (ref 6.5–8.1)

## 2024-08-05 LAB — CEA (ACCESS): CEA (CHCC): 33.64 ng/mL — ABNORMAL HIGH (ref 0.00–5.00)

## 2024-08-05 MED ORDER — DEXAMETHASONE SOD PHOSPHATE PF 10 MG/ML IJ SOLN
10.0000 mg | Freq: Once | INTRAMUSCULAR | Status: AC
  Administered 2024-08-05: 10 mg via INTRAVENOUS

## 2024-08-05 MED ORDER — DEXTROSE 5 % IV SOLN: INTRAVENOUS | Status: DC

## 2024-08-05 MED ORDER — LEUCOVORIN CALCIUM INJECTION 350 MG
400.0000 mg/m2 | Freq: Once | INTRAVENOUS | Status: AC
  Administered 2024-08-05: 736 mg via INTRAVENOUS
  Filled 2024-08-05: qty 36.8

## 2024-08-05 MED ORDER — SODIUM CHLORIDE 0.9 % IV SOLN
1200.0000 mg/m2 | INTRAVENOUS | Status: DC
  Administered 2024-08-05: 2200 mg via INTRAVENOUS
  Filled 2024-08-05: qty 44

## 2024-08-05 MED ORDER — PALONOSETRON HCL INJECTION 0.25 MG/5ML
0.2500 mg | Freq: Once | INTRAVENOUS | Status: AC
  Administered 2024-08-05: 0.25 mg via INTRAVENOUS
  Filled 2024-08-05: qty 5

## 2024-08-05 MED ORDER — OXALIPLATIN CHEMO INJECTION 100 MG/20ML
85.0000 mg/m2 | Freq: Once | INTRAVENOUS | Status: AC
  Administered 2024-08-05: 150 mg via INTRAVENOUS
  Filled 2024-08-05: qty 20

## 2024-08-05 NOTE — Progress Notes (Signed)
 Patient seen by Olam Ned NP today  Vitals are within treatment parameters:Yes   Labs are within treatment parameters: Yes   Treatment plan has been signed: Yes   Per physician team, Patient is ready for treatment and there are NO modifications to the treatment plan.

## 2024-08-05 NOTE — Progress Notes (Signed)
 [facility] OFFICE PROGRESS NOTE   Diagnosis: Duodenal carcinoma  INTERVAL HISTORY:   Ms. Greenspan returns as scheduled.  She completed cycle 6 FOLFOX 07/05/2024.  She denies nausea/vomiting.  She tends to develop loose stools after chemotherapy, 5-6 times a day at the most.  She takes 1 or 2 Imodium tablets a day with good control.  Cold sensitivity lasted about 2 weeks.  No change in baseline neuropathy symptoms.  No abdominal pain.  Good appetite.  Objective:  Vital signs in last 24 hours:  Blood pressure 114/69, pulse 68, temperature (!) 97.5 F (36.4 C), temperature source Oral, resp. rate 18, height 5' 2 (1.575 m), weight 169 lb 8 oz (76.9 kg), SpO2 99%.    HEENT: No thrush or ulcers. Resp: Lungs clear bilaterally. Cardio: Regular rate and rhythm. GI: No hepatosplenomegaly.  No mass.  Nontender. Vascular: No leg edema. Neuro: Vibratory sense minimally decreased over the fingertips for tuning fork exam. Skin: Palms without erythema. Port-A-Cath without erythema.  Lab Results:  Lab Results  Component Value Date   WBC 3.8 (L) 08/05/2024   HGB 9.7 (L) 08/05/2024   HCT 31.7 (L) 08/05/2024   MCV 83.2 08/05/2024   PLT 218 08/05/2024   NEUTROABS 2.0 08/05/2024    Imaging:  No results found.  Medications: I have reviewed the patient's current medications.  Assessment/Plan: Duodenal carcinoma 04/04/2024-CT abdomen/pelvis: Heterogenous partially exophytic mass in the posterior dome of the right liver, heterogenous low-attenuation mass in the descending duodenum with extraluminal extension 04/06/2024 upper endoscopy: Large mass in the second portion of the duodenum, completely obstructing, ulcerated.  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  Peripheral hypermetabolism in the posterior right hepatic lesion.  Several tiny bilateral pulmonary nodules measuring 3 to 5 mm, too small to characterize by PET.  Scattered foci of hypermetabolism along the length of the colon, nonspecific. Cycle 1 FOLFOX 04/27/2024 Treatment held 05/10/2024 due to neutropenia (ANC 0.7) Cycle 2 FOLFOX 05/18/2024, Udenyca  Cycle 3 FOLFOX 06/02/2024, Udenyca  Cycle 4 FOLFOX 06/15/2024, 5-FU bolus eliminated, Udenyca  Cycle 5 FOLFOX 06/30/2024, 5-FU pump dose reduced due to diarrhea, Udenyca  Cycle 6 FOLFOX 07/05/2024, Udenyca  CTs at [facility] 07/26/2024-slight interval decrease in size and degree of enhancement of the annular duodenal mass which infiltrates along the pancreaticoduodenal groove.  No significant change in size of the lobulated mass occupying much of hepatic segment 7.  Unchanged subcentimeter pulmonary nodules.  No new sites of disease.  Moderate gastroduodenal luminal distention above the level of the mass. 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 Fettes appears stable.  She has completed 6 cycles of FOLFOX.  She had recent restaging CTs at [facility] which showed a slight decrease in the duodenal mass, no significant change in the liver mass, no new disease.  The recommendation from Dr. Debora  is to continue systemic therapy and consider adding biologic therapy.  We discussed bevacizumab  with Ms. Arechiga and her daughter at today's visit.  We reviewed potential side effects including bleeding, delayed wound healing, proteinuria, hypertension, allergic reaction, reversible posterior leukoencephalopathy syndrome, bowel perforation, thromboembolism.  She was provided with printed information.  We also discussed changing chemotherapy from FOLFOX to FOLFIRI.  We reviewed potential side effects associated with irinotecan including bone marrow toxicity, hair loss, diarrhea (both early and late phase).  She was provided with printed information.  She would like to proceed with FOLFOX today as scheduled.  She will consider adding bevacizumab and changing from FOLFOX to FOLFIRI.  She will let us  know her decision.  CBC and chemistry panel reviewed.  Labs are adequate for treatment.  She will return for follow-up and treatment in 2 weeks.  We are available to see her sooner if needed.  Patient seen with Dr. Cloretta.    Olam Ned ANP/GNP-BC   08/05/2024  9:44 AM   This was a shared visit with Olam Ned.  Ms. Shrake has completed 6 cycles of FOLFOX.  We reviewed results of the restaging CTs at [facility].  I communicated with Dr. Debora.  He reports her case was presented at the [facility] tumor conference.  The recommendation is to continue chemotherapy and add a biologic agent.  We discussed bevacizumab with Ms. Gautreau and her daughter.  We reviewed potential toxicities associated with bevacizumab.  Her insurance has not approved bevacizumab to date and Ms. Teena would like to have time to consider the addition of bevacizumab.  We also discussed second line systemic therapy with FOLFIRI.  She will complete a cycle of FOLFOX today.  She will return for an office visit in 2 weeks.  The CEA is lower today.  We will plan for a restaging CT evaluation after cycle 10  FOLFOX.  Arvella Cloretta, MD

## 2024-08-05 NOTE — Telephone Encounter (Signed)
 Please note that a fax was sent to [phone] regarding the request to load the CT scan scheduled for 07/26/2024 from [facility].

## 2024-08-05 NOTE — Patient Instructions (Signed)

## 2024-08-05 NOTE — Patient Instructions (Addendum)
 Irinotecan Injection What is this medication? IRINOTECAN (ir in oh TEE kan) treats some types of cancer. It works by slowing down the growth of cancer cells. This medicine may be used for other purposes; ask your health care provider or pharmacist if you have questions. COMMON BRAND NAME(S): Camptosar What should I tell my care team before I take this medication? They need to know if you have any of these conditions: Dehydration Diarrhea Infection, especially a viral infection, such as chickenpox, cold sores, herpes Liver disease Low blood cell levels (white cells, red cells, and platelets) Low levels of electrolytes, such as calcium , magnesium, or potassium in your blood Recent or ongoing radiation An unusual or allergic reaction to irinotecan, other medications, foods, dyes, or preservatives If you or your partner are pregnant or trying to get pregnant Breast-feeding How should I use this medication? This medication is injected into a vein. It is given by your care team in a hospital or clinic setting. Talk to your care team about the use of this medication in children. Special care may be needed. Overdosage: If you think you have taken too much of this medicine contact a poison control center or emergency room at once. NOTE: This medicine is only for you. Do not share this medicine with others. What if I miss a dose? Keep appointments for follow-up doses. It is important not to miss your dose. Call your care team if you are unable to keep an appointment. What may interact with this medication? Do not take this medication with any of the following: Cobicistat Itraconazole This medication may also interact with the following: Certain antibiotics, such as clarithromycin, rifampin, rifabutin Certain antivirals for HIV or AIDS Certain medications for fungal infections, such as ketoconazole, posaconazole, voriconazole Certain medications for seizures, such as carbamazepine,  phenobarbital, phenytoin Gemfibrozil Nefazodone St. John's wort This list may not describe all possible interactions. Give your health care provider a list of all the medicines, herbs, non-prescription drugs, or dietary supplements you use. Also tell them if you smoke, drink alcohol, or use illegal drugs. Some items may interact with your medicine. What should I watch for while using this medication? Your condition will be monitored carefully while you are receiving this medication. You may need blood work while taking this medication. This medication may make you feel generally unwell. This is not uncommon as chemotherapy can affect healthy cells as well as cancer cells. Report any side effects. Continue your course of treatment even though you feel ill unless your care team tells you to stop. This medication can cause serious side effects. To reduce the risk, your care team may give you other medications to take before receiving this one. Be sure to follow the directions from your care team. This medication may affect your coordination, reaction time, or judgement. Do not drive or operate machinery until you know how this medication affects you. Sit up or stand slowly to reduce the risk of dizzy or fainting spells. Drinking alcohol with this medication can increase the risk of these side effects. This medication may increase your risk of getting an infection. Call your care team for advice if you get a fever, chills, sore throat, or other symptoms of a cold or flu. Do not treat yourself. Try to avoid being around people who are sick. Avoid taking medications that contain aspirin , acetaminophen , ibuprofen, naproxen , or ketoprofen unless instructed by your care team. These medications may hide a fever. This medication may increase your risk to bruise or  bleed. Call your care team if you notice any unusual bleeding. Be careful brushing or flossing your teeth or using a toothpick because you may get an  infection or bleed more easily. If you have any dental work done, tell your dentist you are receiving this medication. Talk to your care team if you or your partner are pregnant or think either of you might be pregnant. This medication can cause serious birth defects if taken during pregnancy and for 6 months after the last dose. You will need a negative pregnancy test before starting this medication. Contraception is recommended while taking this medication and for 6 months after the last dose. Your care team can help you find the option that works for you. Do not father a child while taking this medication and for 3 months after the last dose. Use a condom for contraception during this time period. Do not breastfeed while taking this medication and for 7 days after the last dose. This medication may cause infertility. Talk to your care team if you are concerned about your fertility. What side effects may I notice from receiving this medication? Side effects that you should report to your care team as soon as possible: Allergic reactions--skin rash, itching, hives, swelling of the face, lips, tongue, or throat Dry cough, shortness of breath or trouble breathing Increased saliva or tears, increased sweating, stomach cramping, diarrhea, small pupils, unusual weakness or fatigue, slow heartbeat Infection--fever, chills, cough, sore throat, wounds that don't heal, pain or trouble when passing urine, general feeling of discomfort or being unwell Kidney injury--decrease in the amount of urine, swelling of the ankles, hands, or feet Low red blood cell level--unusual weakness or fatigue, dizziness, headache, trouble breathing Severe or prolonged diarrhea Unusual bruising or bleeding Side effects that usually do not require medical attention (report to your care team if they continue or are bothersome): Constipation Diarrhea Hair loss Loss of appetite Nausea Stomach pain This list may not describe all  possible side effects. Call your doctor for medical advice about side effects. You may report side effects to FDA at 1-800-FDA-1088. Where should I keep my medication? This medication is given in a hospital or clinic. It will not be stored at home. NOTE: This sheet is a summary. It may not cover all possible information. If you have questions about this medicine, talk to your doctor, pharmacist, or health care provider.  2024 Elsevier/Gold Standard (2022-01-14 00:00:00) Bevacizumab Injection What is this medication? BEVACIZUMAB (be va SIZ yoo mab) treats some types of cancer. It works by blocking a protein that causes cancer cells to grow and multiply. This helps to slow or stop the spread of cancer cells. It is a monoclonal antibody. This medicine may be used for other purposes; ask your health care provider or pharmacist if you have questions. COMMON BRAND NAME(S): Alymsys, Avastin, MVASI, Vegzalma, Zirabev What should I tell my care team before I take this medication? They need to know if you have any of these conditions: Blood clots Coughing up blood Having or recent surgery Heart failure High blood pressure History of a connection between 2 or more body parts that do not usually connect (fistula) History of a tear in your stomach or intestines Protein in your urine An unusual or allergic reaction to bevacizumab, other medications, foods, dyes, or preservatives Pregnant or trying to get pregnant Breast-feeding How should I use this medication? This medication is injected into a vein. It is given by your care team in a hospital  or clinic setting. Talk to your care team the use of this medication in children. Special care may be needed. Overdosage: If you think you have taken too much of this medicine contact a poison control center or emergency room at once. NOTE: This medicine is only for you. Do not share this medicine with others. What if I miss a dose? Keep appointments for  follow-up doses. It is important not to miss your dose. Call your care team if you are unable to keep an appointment. What may interact with this medication? Interactions are not expected. This list may not describe all possible interactions. Give your health care provider a list of all the medicines, herbs, non-prescription drugs, or dietary supplements you use. Also tell them if you smoke, drink alcohol, or use illegal drugs. Some items may interact with your medicine. What should I watch for while using this medication? Your condition will be monitored carefully while you are receiving this medication. You may need blood work while taking this medication. This medication may make you feel generally unwell. This is not uncommon as chemotherapy can affect healthy cells as well as cancer cells. Report any side effects. Continue your course of treatment even though you feel ill unless your care team tells you to stop. This medication may increase your risk to bruise or bleed. Call your care team if you notice any unusual bleeding. Before having surgery, talk to your care team to make sure it is ok. This medication can increase the risk of poor healing of your surgical site or wound. You will need to stop this medication for 28 days before surgery. After surgery, wait at least 28 days before restarting this medication. Make sure the surgical site or wound is healed enough before restarting this medication. Talk to your care team if questions. Talk to your care team if you may be pregnant. Serious birth defects can occur if you take this medication during pregnancy and for 6 months after the last dose. Contraception is recommended while taking this medication and for 6 months after the last dose. Your care team can help you find the option that works for you. Do not breastfeed while taking this medication and for 6 months after the last dose. This medication can cause infertility. Talk to your care team if  you are concerned about your fertility. What side effects may I notice from receiving this medication? Side effects that you should report to your care team as soon as possible: Allergic reactions--skin rash, itching, hives, swelling of the face, lips, tongue, or throat Bleeding--bloody or black, tar-like stools, vomiting blood or brown material that looks like coffee grounds, red or dark brown urine, small red or purple spots on skin, unusual bruising or bleeding Blood clot--pain, swelling, or warmth in the leg, shortness of breath, chest pain Heart attack--pain or tightness in the chest, shoulders, arms, or jaw, nausea, shortness of breath, cold or clammy skin, feeling faint or lightheaded Heart failure--shortness of breath, swelling of the ankles, feet, or hands, sudden weight gain, unusual weakness or fatigue Increase in blood pressure Infection--fever, chills, cough, sore throat, wounds that don't heal, pain or trouble when passing urine, general feeling of discomfort or being unwell Infusion reactions--chest pain, shortness of breath or trouble breathing, feeling faint or lightheaded Kidney injury--decrease in the amount of urine, swelling of the ankles, hands, or feet Stomach pain that is severe, does not go away, or gets worse Stroke--sudden numbness or weakness of the face, arm, or leg,  trouble speaking, confusion, trouble walking, loss of balance or coordination, dizziness, severe headache, change in vision Sudden and severe headache, confusion, change in vision, seizures, which may be signs of posterior reversible encephalopathy syndrome (PRES) Side effects that usually do not require medical attention (report to your care team if they continue or are bothersome): Back pain Change in taste Diarrhea Dry skin Increased tears Nosebleed This list may not describe all possible side effects. Call your doctor for medical advice about side effects. You may report side effects to FDA at  1-800-FDA-1088. Where should I keep my medication? This medication is given in a hospital or clinic. It will not be stored at home. NOTE: This sheet is a summary. It may not cover all possible information. If you have questions about this medicine, talk to your doctor, pharmacist, or health care provider.  2024 Elsevier/Gold Standard (2022-01-18 00:00:00) [facility] - A DEPT OF [facility]  Discharge Instructions: Thank you for choosing [facility] to provide your oncology and hematology care.   If you have a lab appointment with the Cancer Center, please go directly to the Cancer Center and check in at the registration area.   Wear comfortable clothing and clothing appropriate for easy access to any Portacath or PICC line.   We strive to give you quality time with your provider. You may need to reschedule your appointment if you arrive late (15 or more minutes).  Arriving late affects you and other patients whose appointments are after yours.  Also, if you miss three or more appointments without notifying the office, you may be dismissed from the clinic at the provider's discretion.      For prescription refill requests, have your pharmacy contact our office and allow 72 hours for refills to be completed.    Today you received the following chemotherapy and/or immunotherapy agents: oxaliplatin , leucovorin , fluorouracil        To help prevent nausea and vomiting after your treatment, we encourage you to take your nausea medication as directed.  BELOW ARE SYMPTOMS THAT SHOULD BE REPORTED IMMEDIATELY: *FEVER GREATER THAN 100.4 F (38 C) OR HIGHER *CHILLS OR SWEATING *NAUSEA AND VOMITING THAT IS NOT CONTROLLED WITH YOUR NAUSEA MEDICATION *UNUSUAL SHORTNESS OF BREATH *UNUSUAL BRUISING OR BLEEDING *URINARY PROBLEMS (pain or burning when urinating, or frequent urination) *BOWEL PROBLEMS (unusual diarrhea, constipation, pain near the  anus) TENDERNESS IN MOUTH AND THROAT WITH OR WITHOUT PRESENCE OF ULCERS (sore throat, sores in mouth, or a toothache) UNUSUAL RASH, SWELLING OR PAIN  UNUSUAL VAGINAL DISCHARGE OR ITCHING   Items with * indicate a potential emergency and should be followed up as soon as possible or go to the Emergency Department if any problems should occur.  Please show the CHEMOTHERAPY ALERT CARD or IMMUNOTHERAPY ALERT CARD at check-in to the Emergency Department and triage nurse.  Should you have questions after your visit or need to cancel or reschedule your appointment, please contact [facility] - A DEPT OF [facility]  Dept: [phone]  and follow the prompts.  Office hours are 8:00 a.m. to 4:30 p.m. Monday - Friday. Please note that voicemails left after 4:00 p.m. may not be returned until the following business day.  We are closed weekends and major holidays. You have access to a nurse at all times for urgent questions. Please call the main number to the clinic Dept: [phone] and follow the prompts.   For any non-urgent questions,  you may also contact your provider using MyChart. We now offer e-Visits for anyone 49 and older to request care online for non-urgent symptoms. For details visit [email or web address].   Also download the MyChart app! Go to the app store, search MyChart, open the app, select [facility], and log in with your MyChart username and password.

## 2024-08-06 ENCOUNTER — Encounter: Payer: Self-pay | Admitting: Oncology

## 2024-08-07 ENCOUNTER — Inpatient Hospital Stay

## 2024-08-07 VITALS — BP 140/60 | HR 60 | Temp 98.7°F | Resp 20

## 2024-08-07 DIAGNOSIS — C179 Malignant neoplasm of small intestine, unspecified: Secondary | ICD-10-CM

## 2024-08-07 DIAGNOSIS — Z5111 Encounter for antineoplastic chemotherapy: Secondary | ICD-10-CM | POA: Diagnosis not present

## 2024-08-07 MED ORDER — PEGFILGRASTIM-CBQV 6 MG/0.6ML ~~LOC~~ SOSY
6.0000 mg | PREFILLED_SYRINGE | Freq: Once | SUBCUTANEOUS | Status: AC
  Administered 2024-08-07: 6 mg via SUBCUTANEOUS

## 2024-08-07 MED ORDER — SODIUM CHLORIDE 0.9% FLUSH
10.0000 mL | INTRAVENOUS | Status: DC | PRN
  Administered 2024-08-07: 10 mL

## 2024-08-09 ENCOUNTER — Inpatient Hospital Stay: Admitting: Licensed Clinical Social Worker

## 2024-08-14 ENCOUNTER — Other Ambulatory Visit: Payer: Self-pay | Admitting: Oncology

## 2024-08-14 DIAGNOSIS — C179 Malignant neoplasm of small intestine, unspecified: Secondary | ICD-10-CM

## 2024-08-17 ENCOUNTER — Inpatient Hospital Stay: Attending: Nurse Practitioner

## 2024-08-17 ENCOUNTER — Inpatient Hospital Stay

## 2024-08-17 ENCOUNTER — Inpatient Hospital Stay: Admitting: Nurse Practitioner

## 2024-08-17 ENCOUNTER — Encounter: Payer: Self-pay | Admitting: Nurse Practitioner

## 2024-08-17 VITALS — BP 127/65 | HR 88 | Temp 97.8°F | Resp 18 | Ht 62.0 in | Wt 169.6 lb

## 2024-08-17 DIAGNOSIS — Z79631 Long term (current) use of antimetabolite agent: Secondary | ICD-10-CM | POA: Insufficient documentation

## 2024-08-17 DIAGNOSIS — Z79634 Long term (current) use of topoisomerase inhibitor: Secondary | ICD-10-CM | POA: Insufficient documentation

## 2024-08-17 DIAGNOSIS — C179 Malignant neoplasm of small intestine, unspecified: Secondary | ICD-10-CM

## 2024-08-17 DIAGNOSIS — R630 Anorexia: Secondary | ICD-10-CM | POA: Insufficient documentation

## 2024-08-17 DIAGNOSIS — R918 Other nonspecific abnormal finding of lung field: Secondary | ICD-10-CM | POA: Insufficient documentation

## 2024-08-17 DIAGNOSIS — E039 Hypothyroidism, unspecified: Secondary | ICD-10-CM | POA: Diagnosis not present

## 2024-08-17 DIAGNOSIS — Z853 Personal history of malignant neoplasm of breast: Secondary | ICD-10-CM | POA: Diagnosis not present

## 2024-08-17 DIAGNOSIS — Z5189 Encounter for other specified aftercare: Secondary | ICD-10-CM | POA: Insufficient documentation

## 2024-08-17 DIAGNOSIS — R197 Diarrhea, unspecified: Secondary | ICD-10-CM | POA: Diagnosis not present

## 2024-08-17 DIAGNOSIS — F32A Depression, unspecified: Secondary | ICD-10-CM | POA: Insufficient documentation

## 2024-08-17 DIAGNOSIS — F419 Anxiety disorder, unspecified: Secondary | ICD-10-CM | POA: Diagnosis not present

## 2024-08-17 DIAGNOSIS — T699XXA Effect of reduced temperature, unspecified, initial encounter: Secondary | ICD-10-CM | POA: Diagnosis not present

## 2024-08-17 DIAGNOSIS — M1990 Unspecified osteoarthritis, unspecified site: Secondary | ICD-10-CM | POA: Diagnosis not present

## 2024-08-17 DIAGNOSIS — D709 Neutropenia, unspecified: Secondary | ICD-10-CM | POA: Diagnosis not present

## 2024-08-17 DIAGNOSIS — I10 Essential (primary) hypertension: Secondary | ICD-10-CM | POA: Diagnosis not present

## 2024-08-17 DIAGNOSIS — R040 Epistaxis: Secondary | ICD-10-CM | POA: Diagnosis not present

## 2024-08-17 DIAGNOSIS — R059 Cough, unspecified: Secondary | ICD-10-CM | POA: Insufficient documentation

## 2024-08-17 DIAGNOSIS — K769 Liver disease, unspecified: Secondary | ICD-10-CM | POA: Insufficient documentation

## 2024-08-17 DIAGNOSIS — X31XXXA Exposure to excessive natural cold, initial encounter: Secondary | ICD-10-CM | POA: Diagnosis not present

## 2024-08-17 DIAGNOSIS — R634 Abnormal weight loss: Secondary | ICD-10-CM | POA: Diagnosis not present

## 2024-08-17 DIAGNOSIS — Z79899 Other long term (current) drug therapy: Secondary | ICD-10-CM | POA: Insufficient documentation

## 2024-08-17 DIAGNOSIS — Z5111 Encounter for antineoplastic chemotherapy: Secondary | ICD-10-CM | POA: Diagnosis present

## 2024-08-17 DIAGNOSIS — R202 Paresthesia of skin: Secondary | ICD-10-CM | POA: Insufficient documentation

## 2024-08-17 DIAGNOSIS — C170 Malignant neoplasm of duodenum: Secondary | ICD-10-CM | POA: Insufficient documentation

## 2024-08-17 DIAGNOSIS — Z8042 Family history of malignant neoplasm of prostate: Secondary | ICD-10-CM | POA: Insufficient documentation

## 2024-08-17 LAB — CMP (CANCER CENTER ONLY)
ALT: 21 U/L (ref 0–44)
AST: 32 U/L (ref 15–41)
Albumin: 4.2 g/dL (ref 3.5–5.0)
Alkaline Phosphatase: 340 U/L — ABNORMAL HIGH (ref 38–126)
Anion gap: 14 (ref 5–15)
BUN: 8 mg/dL (ref 8–23)
CO2: 23 mmol/L (ref 22–32)
Calcium: 10.1 mg/dL (ref 8.9–10.3)
Chloride: 106 mmol/L (ref 98–111)
Creatinine: 0.75 mg/dL (ref 0.44–1.00)
GFR, Estimated: >60 mL/min (ref >60)
Glucose, Bld: 128 mg/dL — ABNORMAL HIGH (ref 70–99)
Potassium: 3.1 mmol/L — ABNORMAL LOW (ref 3.5–5.1)
Sodium: 142 mmol/L (ref 135–145)
Total Bilirubin: 0.3 mg/dL (ref 0.0–1.2)
Total Protein: 7.4 g/dL (ref 6.5–8.1)

## 2024-08-17 LAB — CBC WITH DIFFERENTIAL (CANCER CENTER ONLY)
Abs Immature Granulocytes: 0.26 K/uL — ABNORMAL HIGH (ref 0.00–0.07)
Basophils Absolute: 0 K/uL (ref 0.0–0.1)
Basophils Relative: 0 %
Eosinophils Absolute: 0.1 K/uL (ref 0.0–0.5)
Eosinophils Relative: 1 %
HCT: 33 % — ABNORMAL LOW (ref 36.0–46.0)
Hemoglobin: 10 g/dL — ABNORMAL LOW (ref 12.0–15.0)
Immature Granulocytes: 2 %
Lymphocytes Relative: 13 %
Lymphs Abs: 1.9 K/uL (ref 0.7–4.0)
MCH: 25.3 pg — ABNORMAL LOW (ref 26.0–34.0)
MCHC: 30.3 g/dL (ref 30.0–36.0)
MCV: 83.3 fL (ref 80.0–100.0)
Monocytes Absolute: 0.6 K/uL (ref 0.1–1.0)
Monocytes Relative: 5 %
Neutro Abs: 11.2 K/uL — ABNORMAL HIGH (ref 1.7–7.7)
Neutrophils Relative %: 79 %
Platelet Count: 149 K/uL — ABNORMAL LOW (ref 150–400)
RBC: 3.96 MIL/uL (ref 3.87–5.11)
RDW: 21.8 % — ABNORMAL HIGH (ref 11.5–15.5)
WBC Count: 14.1 K/uL — ABNORMAL HIGH (ref 4.0–10.5)
nRBC: 0 % (ref 0.0–0.2)

## 2024-08-17 LAB — CEA (ACCESS): CEA (CHCC): 39.49 ng/mL — ABNORMAL HIGH (ref 0.00–5.00)

## 2024-08-17 MED ORDER — POTASSIUM CHLORIDE CRYS ER 20 MEQ PO TBCR: 20.0000 meq | EXTENDED_RELEASE_TABLET | Freq: Every day | ORAL | 1 refills | Status: AC

## 2024-08-17 NOTE — Progress Notes (Signed)
 [facility] OFFICE PROGRESS NOTE   Diagnosis: Duodenal carcinoma  INTERVAL HISTORY:   Elaine Simon returns as scheduled.  She completed cycle 7 FOLFOX 08/05/2024.  She denies nausea/vomiting.  No mouth sores.  She has intermittent loose stools following treatment.  She is able to control with an antidiarrheal.  She has persistent mild cold sensitivity.  She has persistent mild tingling in the fingertips unrelated to cold exposure.  Symptoms do not interfere with activity.  Objective:  Vital signs in last 24 hours:  Blood pressure 127/65, pulse 88, temperature 97.8 F (36.6 C), temperature source Temporal, resp. rate 18, height 5' 2 (1.575 m), weight 169 lb 9.6 oz (76.9 kg), SpO2 98%.    HEENT: No thrush or ulcers. Resp: Lungs clear bilaterally. Cardio: Regular rate and rhythm. GI: Nontender.  No hepatosplenomegaly. Vascular: No leg edema. Neuro: Vibratory sense intact to mildly decreased over the fingertips per tuning fork exam. Skin: Palms without erythema. Port-A-Cath without erythema.  Lab Results:  Lab Results  Component Value Date   WBC 3.8 (L) 08/05/2024   HGB 9.7 (L) 08/05/2024   HCT 31.7 (L) 08/05/2024   MCV 83.2 08/05/2024   PLT 218 08/05/2024   NEUTROABS 2.0 08/05/2024    Imaging:  No results found.  Medications: I have reviewed the patient's current medications.  Assessment/Plan: Duodenal carcinoma 04/04/2024-CT abdomen/pelvis: Heterogenous partially exophytic mass in the posterior dome of the right liver, heterogenous low-attenuation mass in the descending duodenum with extraluminal extension 04/06/2024 upper endoscopy: Large mass in the second portion of the duodenum, completely obstructing, ulcerated.  Biopsy-moderately differentiated adenocarcinoma with mucinous features; microsatellite instability not detected, mismatch repair protein expression intact, foundation 1-MSS, tumor mutation burden 1, [id number], HRD signature  negative 04/08/2024: Ultrasound biopsy of liver mass: Acute formation/possible ascending cholangitis with prominent appearing lymphoid aggregate, background pools of acellular mucin 04/20/2024 PET scan-markedly hypermetabolic duodenal lesion with hypermetabolic lymph nodes in the hepatoduodenal ligament.  Peripheral hypermetabolism in the posterior right hepatic lesion.  Several tiny bilateral pulmonary nodules measuring 3 to 5 mm, too small to characterize by PET.  Scattered foci of hypermetabolism along the length of the colon, nonspecific. Cycle 1 FOLFOX 04/27/2024 Treatment held 05/10/2024 due to neutropenia (ANC 0.7) Cycle 2 FOLFOX 05/18/2024, Udenyca  Cycle 3 FOLFOX 06/02/2024, Udenyca  Cycle 4 FOLFOX 06/15/2024, 5-FU bolus eliminated, Udenyca  Cycle 5 FOLFOX 06/30/2024, 5-FU pump dose reduced due to diarrhea, Udenyca  Cycle 6 FOLFOX 07/05/2024, Udenyca  [facility] at [facility] 07/26/2024-slight interval decrease in size and degree of enhancement of the annular duodenal mass which infiltrates along the pancreaticoduodenal groove.  No significant change in size of the lobulated mass occupying much of hepatic segment 7.  Unchanged subcentimeter pulmonary nodules.  No new sites of disease.  Moderate gastroduodenal luminal distention above the level of the mass. Tumor board discussion at [facility]-continue systemic treatment, consider adding in biologic Cycle 7 FOLFOX 08/05/2024 Stage I left breast cancer 1995, status post lumpectomy and left axillary dissection, adjuvant chemotherapy, radiation, and a visa Hypertension Hypothyroidism Osteoarthritis Normocytic anemia July 2025 Anorexia/weight loss secondary to #1 Anxiety/depression Family history of prostate cancer  Disposition: Elaine Simon appears stable.  She has completed 7 cycles of FOLFOX.  Overall tolerating treatment well.  She is able to adequately control diarrhea with an antidiarrheal medication.  She is developing Oxaliplatin  neuropathy.   We discussed this is a dose-limiting toxicity.  We also reviewed she has likely achieved maximal benefit from oxaliplatin  at this point.  We again discussed adding bevacizumab to  the treatment regimen.  We also discussed discontinuing FOLFOX and changing to FOLFIRI/bevacizumab.  We reviewed potential toxicities.  She was previously provided with printed information on irinotecan and bevacizumab.  She would like to begin treatment with FOLFIRI/bevacizumab.  She understands goals of treatment are to potentially improve the cancer/extend her life/improve any side effects cancer is causing.  CBC and chemistry panel reviewed.  She has hypokalemia.  She will begin K-dur 20 mEq daily.  We are canceling today's treatment.  She will return in 1 week for cycle 1 FOLFIRI/bevacizumab.  We will see her in follow-up prior to treatment.  Plan reviewed with Dr. Cloretta.   Olam Ned ANP/GNP-BC   08/17/2024  11:19 AM

## 2024-08-18 ENCOUNTER — Inpatient Hospital Stay

## 2024-08-18 NOTE — Progress Notes (Signed)
 [facility] CSW Counseling Note  Patient was referred by self. Treatment type: Individual   Presenting Concerns: Patient and/or family reports the following symptoms/concerns: depression and stress Duration of problem: since diagnosis approx 4 months ago ; Severity of problem: moderate   Orientation:oriented to person, place, and time/date.   Affect: Appropriate and Congruent Risk of harm to self or others: No plan to harm self or others  Patient and/or Family's Strengths/Protective Factors: Social connections, Social and Emotional competence, Concrete supports in place (healthy food, safe environments, etc.), and ability for insight.Active sense of humor  Average or above average intelligence  Capable of independent living  Communication skills  General fund of knowledge  Motivation for treatment/growth  Religious Affiliation  Supportive family/friends      Goals Addressed: Patient will:  Reduce symptoms of: depression and stress  Increase knowledge and/or ability of:  coping skills and self-management skills.  Increase healthy adjustment to current life circumstances    Progress towards Goals: Progressing   Interventions: Interventions utilized:  Solution-Focused Strategies, Behavioral Activation, Supportive Counseling, Communication Skills, and Supportive Reflection      08/05/2024    9:00 AM 07/13/2024    9:34 AM 07/02/2024    1:47 PM 06/30/2024    9:00 AM 06/15/2024   10:12 AM  Depression screen PHQ 2/9  Decreased Interest 1 1 1 1 1   Down, Depressed, Hopeless 1 1 1 1 1   PHQ - 2 Score 2 2 2 2 2   Altered sleeping 1 0  1 0  Tired, decreased energy 1 1  1 1   Change in appetite 1 0  1 0  Feeling bad or failure about yourself  1 0  0 0  Trouble concentrating 2 0  0 0  Moving slowly or fidgety/restless 2 0  0 0  Suicidal thoughts 0 0  0 0  PHQ-9 Score 10 3   5  3       Data saved with a previous flowsheet row definition         06/11/2024    4:10 PM 08/09/2019     4:34 PM  GAD 7 : Generalized Anxiety Score  Nervous, Anxious, on Edge 2 2  Control/stop worrying 2 3  Worry too much - different things 1 2  Trouble relaxing 0 2  Restless 0 0  Easily annoyed or irritable 1 3  Afraid - awful might happen 1 3  Total GAD 7 Score 7 15  Anxiety Difficulty Somewhat difficult        Assessment:  Patient reports stress related to new treatment plan and decision to take a more aggressive approach. Patient expressed self-doubt that she is making the right decision and CSW Intern helped reframe to- making the best decision with the information available, noting that new decisions can be made with new information. Patient and Intern explored what it means for pt to live a full life in the face of uncertainty, and brainstormed ways to meet her goals. Patient reports faith as a protective factor, knowing that it isn't all up to me acknowledging the support she receives from her beliefs and faith community.  Patient and intern brainstormed continued ways to build and strengthen pt's support network, setting goals for pt to have conversations with family and friends regarding needs. Patient expressed ongoing interpersonal tension re her boundaries not being respected. Patient and intern problem solved practical ways to establish and support pt's autonomy and independence while still getting her needs met.  Plan: Follow up with CSW: In 2 weeks Behavioral recommendations: Continue to build support network by reaching out to friends/ family and sharing news of Dx/ asking for help when needed; Communicate boundaries regarding when pt is/ isn't able to have visitors in the home; Grounding practices discussed in session: time in nature, prayer, breath meditation. Referral(s): Next GI Support Group in January      Thersia KATHEE Daring Clinical Social Work Intern [facility]

## 2024-08-19 ENCOUNTER — Inpatient Hospital Stay

## 2024-08-19 NOTE — Progress Notes (Signed)
 I reviewed patient visit by social work Tax inspector. I concur with the treatment plan as documented in the SW intern's note.   Nazareth Norenberg E Lylianna Fraiser, LCSW Clinical Child psychotherapist

## 2024-08-19 NOTE — Progress Notes (Signed)
 [facility] CSW Progress Note  Clinical Social Work Intern contacted patient by phone to follow-up on scheduling and emotional support. Patient is concerned about length and quantity of appointments coming up since her chemo medications have changed.    Interventions: Provided brief mental health counseling with regard to doubt and anxiety about new treatment plan beginning next week and uncertainty about side effects and efficacy of new meds. Intern encouraged pt to reach out to providers with any medical questions, and offered a visualization to do during Tx next Thursday to focus on healing properties of new medications.        Follow Up Plan:  CSW Intern will call pt next week after Tx to check in, and see pt for counseling on 12/17.     Thersia KATHEE Daring Clinical Social Worker [facility]

## 2024-08-22 ENCOUNTER — Other Ambulatory Visit: Payer: Self-pay | Admitting: Oncology

## 2024-08-22 DIAGNOSIS — C179 Malignant neoplasm of small intestine, unspecified: Secondary | ICD-10-CM

## 2024-08-22 NOTE — Progress Notes (Signed)
 DISCONTINUE OFF PATHWAY REGIMEN - Other   OFF01020:mFOLFOX6 (Leucovorin  IV D1 + Fluorouracil  IV D1/CIV D1,2 + Oxaliplatin  IV D1) q14 Days:   A cycle is every 14 days:     Oxaliplatin       Leucovorin       Fluorouracil       Fluorouracil    **Always confirm dose/schedule in your pharmacy ordering system**  PRIOR TREATMENT: mFOLFOX6 (Leucovorin  IV D1 + Fluorouracil  IV D1/CIV D1,2 + Oxaliplatin  IV D1) q14 Days  START OFF PATHWAY REGIMEN - Other   OFF01023:FOLFIRI + Bevacizumab  (Leucovorin  IV D1 + Fluorouracil  IV D1/CIV D1,2 + Irinotecan  IV D1 + Bevacizumab  IV D1) q14 Days:   A cycle is every 14 days:     Bevacizumab -xxxx      Irinotecan       Leucovorin       Fluorouracil       Fluorouracil    **Always confirm dose/schedule in your pharmacy ordering system**  Patient Characteristics: Intent of Therapy: Non-Curative / Palliative Intent, Discussed with Patient

## 2024-08-23 ENCOUNTER — Inpatient Hospital Stay: Admitting: Licensed Clinical Social Worker

## 2024-08-23 DIAGNOSIS — C179 Malignant neoplasm of small intestine, unspecified: Secondary | ICD-10-CM

## 2024-08-23 NOTE — Progress Notes (Signed)
 [facility] Healthcare Advance Directives Clinical Social Work  Patient presented to Advance Directives Clinic  to review and complete healthcare advance directives.  Clinical Social Worker met with patient.  CSW went over the Advanced Directives packet with pt and answered all questions regarding the packet.  Pt states she would like to discuss the document with her family before completing and will contact supportive services to complete the document at a later time.   Clinical Social Worker encouraged patient/family to contact with any additional questions or concerns.   Devere JONELLE Manna, LCSW Clinical Social Worker [facility]

## 2024-08-25 ENCOUNTER — Inpatient Hospital Stay

## 2024-08-25 ENCOUNTER — Other Ambulatory Visit: Payer: Self-pay

## 2024-08-25 ENCOUNTER — Inpatient Hospital Stay: Admitting: Nurse Practitioner

## 2024-08-25 ENCOUNTER — Encounter: Payer: Self-pay | Admitting: Nurse Practitioner

## 2024-08-25 VITALS — BP 134/77 | HR 83 | Temp 97.8°F | Resp 18 | Ht 62.0 in | Wt 167.6 lb

## 2024-08-25 VITALS — BP 152/76 | HR 62 | Temp 97.9°F | Resp 17

## 2024-08-25 DIAGNOSIS — Z853 Personal history of malignant neoplasm of breast: Secondary | ICD-10-CM

## 2024-08-25 DIAGNOSIS — C179 Malignant neoplasm of small intestine, unspecified: Secondary | ICD-10-CM

## 2024-08-25 DIAGNOSIS — Z5111 Encounter for antineoplastic chemotherapy: Secondary | ICD-10-CM | POA: Diagnosis not present

## 2024-08-25 LAB — CMP (CANCER CENTER ONLY)
ALT: 14 U/L (ref 0–44)
AST: 28 U/L (ref 15–41)
Albumin: 4.3 g/dL (ref 3.5–5.0)
Alkaline Phosphatase: 189 U/L — ABNORMAL HIGH (ref 38–126)
Anion gap: 13 (ref 5–15)
BUN: 10 mg/dL (ref 8–23)
CO2: 23 mmol/L (ref 22–32)
Calcium: 10 mg/dL (ref 8.9–10.3)
Chloride: 104 mmol/L (ref 98–111)
Creatinine: 0.78 mg/dL (ref 0.44–1.00)
GFR, Estimated: >60 mL/min (ref >60)
Glucose, Bld: 139 mg/dL — ABNORMAL HIGH (ref 70–99)
Potassium: 3.8 mmol/L (ref 3.5–5.1)
Sodium: 139 mmol/L (ref 135–145)
Total Bilirubin: 0.3 mg/dL (ref 0.0–1.2)
Total Protein: 7.3 g/dL (ref 6.5–8.1)

## 2024-08-25 LAB — CBC WITH DIFFERENTIAL (CANCER CENTER ONLY)
Abs Immature Granulocytes: 0.03 K/uL (ref 0.00–0.07)
Basophils Absolute: 0 K/uL (ref 0.0–0.1)
Basophils Relative: 1 %
Eosinophils Absolute: 0 K/uL (ref 0.0–0.5)
Eosinophils Relative: 1 %
HCT: 32.8 % — ABNORMAL LOW (ref 36.0–46.0)
Hemoglobin: 10.2 g/dL — ABNORMAL LOW (ref 12.0–15.0)
Immature Granulocytes: 1 %
Lymphocytes Relative: 36 %
Lymphs Abs: 1.8 K/uL (ref 0.7–4.0)
MCH: 26.2 pg (ref 26.0–34.0)
MCHC: 31.1 g/dL (ref 30.0–36.0)
MCV: 84.1 fL (ref 80.0–100.0)
Monocytes Absolute: 0.4 K/uL (ref 0.1–1.0)
Monocytes Relative: 8 %
Neutro Abs: 2.8 K/uL (ref 1.7–7.7)
Neutrophils Relative %: 53 %
Platelet Count: 203 K/uL (ref 150–400)
RBC: 3.9 MIL/uL (ref 3.87–5.11)
RDW: 22 % — ABNORMAL HIGH (ref 11.5–15.5)
WBC Count: 5.1 K/uL (ref 4.0–10.5)
nRBC: 0 % (ref 0.0–0.2)

## 2024-08-25 LAB — TOTAL PROTEIN, URINE DIPSTICK: Protein, ur: NEGATIVE mg/dL

## 2024-08-25 LAB — CEA (ACCESS): CEA (CHCC): 36.85 ng/mL — ABNORMAL HIGH (ref 0.00–5.00)

## 2024-08-25 MED ORDER — SODIUM CHLORIDE 0.9 % IV SOLN
400.0000 mg/m2 | Freq: Once | INTRAVENOUS | Status: AC
  Administered 2024-08-25: 732 mg via INTRAVENOUS
  Filled 2024-08-25: qty 36.6

## 2024-08-25 MED ORDER — DEXAMETHASONE SOD PHOSPHATE PF 10 MG/ML IJ SOLN
10.0000 mg | Freq: Once | INTRAMUSCULAR | Status: AC
  Administered 2024-08-25: 10 mg via INTRAVENOUS

## 2024-08-25 MED ORDER — SODIUM CHLORIDE 0.9 % IV SOLN: INTRAVENOUS | Status: DC

## 2024-08-25 MED ORDER — SODIUM CHLORIDE 0.9 % IV SOLN
180.0000 mg/m2 | Freq: Once | INTRAVENOUS | Status: AC
  Administered 2024-08-25: 320 mg via INTRAVENOUS
  Filled 2024-08-25: qty 15

## 2024-08-25 MED ORDER — SODIUM CHLORIDE 0.9 % IV SOLN
5.0000 mg/kg | Freq: Once | INTRAVENOUS | Status: AC
  Administered 2024-08-25: 400 mg via INTRAVENOUS
  Filled 2024-08-25: qty 16

## 2024-08-25 MED ORDER — ATROPINE SULFATE 1 MG/ML IV SOLN
0.5000 mg | Freq: Once | INTRAVENOUS | Status: AC | PRN
  Administered 2024-08-25: 0.5 mg via INTRAVENOUS
  Filled 2024-08-25: qty 1

## 2024-08-25 MED ORDER — PALONOSETRON HCL INJECTION 0.25 MG/5ML
0.2500 mg | Freq: Once | INTRAVENOUS | Status: AC
  Administered 2024-08-25: 0.25 mg via INTRAVENOUS
  Filled 2024-08-25: qty 5

## 2024-08-25 MED ORDER — SODIUM CHLORIDE 0.9 % IV SOLN
1200.0000 mg/m2 | INTRAVENOUS | Status: DC
  Administered 2024-08-25: 2000 mg via INTRAVENOUS
  Filled 2024-08-25: qty 40

## 2024-08-25 NOTE — Progress Notes (Signed)
 Patient seen by Olam Ned NP today  Vitals are within treatment parameters:Yes   Labs are within treatment parameters: Yes   Treatment plan has been signed: Yes   Per physician team, Patient is ready for treatment and there are NO modifications to the treatment plan.

## 2024-08-25 NOTE — Progress Notes (Signed)
 [facility] Simon OFFICE PROGRESS NOTE   Diagnosis: Duodenal carcinoma  INTERVAL HISTORY:   Elaine Simon returns as scheduled.  She is seen prior to proceeding with cycle 1 FOLFIRI/bevacizumab .  She denies nausea.  Bowels are moving.  No diarrhea.  No abdominal pain.  She has a good appetite.  Objective:  Vital signs in last 24 hours:  Blood pressure 134/77, pulse 83, temperature 97.8 F (36.6 C), temperature source Temporal, resp. rate 18, height 5' 2 (1.575 m), weight 167 lb 9.6 oz (76 kg), SpO2 100%.    HEENT: No thrush or ulcers. Resp: Lungs clear bilaterally. Cardio: Regular rate and rhythm. GI: Nontender.  No hepatosplenomegaly. Vascular: No leg edema. Skin: Palms without erythema. Port-A-Cath without erythema.  Lab Results:  Lab Results  Component Value Date   WBC 5.1 08/25/2024   HGB 10.2 (L) 08/25/2024   HCT 32.8 (L) 08/25/2024   MCV 84.1 08/25/2024   PLT 203 08/25/2024   NEUTROABS 2.8 08/25/2024    Imaging:  No results found.  Medications: I have reviewed the patient's current medications.  Assessment/Plan: Duodenal carcinoma 04/04/2024-CT abdomen/pelvis: Heterogenous partially exophytic mass in the posterior dome of the right liver, heterogenous low-attenuation mass in the descending duodenum with extraluminal extension 04/06/2024 upper endoscopy: Large mass in the second portion of the duodenum, completely obstructing, ulcerated.  Biopsy-moderately differentiated adenocarcinoma with mucinous features; microsatellite instability not detected, mismatch repair protein expression intact, foundation 1-MSS, tumor mutation burden 1, BRAFK601E, HRD signature negative 04/08/2024: Ultrasound biopsy of liver mass: Acute formation/possible ascending cholangitis with prominent appearing lymphoid aggregate, background pools of acellular mucin 04/20/2024 PET scan-markedly hypermetabolic duodenal lesion with hypermetabolic lymph nodes in the hepatoduodenal  ligament.  Peripheral hypermetabolism in the posterior right hepatic lesion.  Several tiny bilateral pulmonary nodules measuring 3 to 5 mm, too small to characterize by PET.  Scattered foci of hypermetabolism along the length of the colon, nonspecific. Cycle 1 FOLFOX 04/27/2024 Treatment held 05/10/2024 due to neutropenia (ANC 0.7) Cycle 2 FOLFOX 05/18/2024, Udenyca  Cycle 3 FOLFOX 06/02/2024, Udenyca  Cycle 4 FOLFOX 06/15/2024, 5-FU bolus eliminated, Udenyca  Cycle 5 FOLFOX 06/30/2024, 5-FU pump dose reduced due to diarrhea, Udenyca  Cycle 6 FOLFOX 07/05/2024, Udenyca  CTs at [facility] 07/26/2024-slight interval decrease in size and degree of enhancement of the annular duodenal mass which infiltrates along the pancreaticoduodenal groove.  No significant change in size of the lobulated mass occupying much of hepatic segment 7.  Unchanged subcentimeter pulmonary nodules.  No new sites of disease.  Moderate gastroduodenal luminal distention above the level of the mass. Tumor board discussion at [facility]-continue systemic treatment, consider adding in biologic Cycle 7 FOLFOX 08/05/2024 Cycle 1 FOLFIRI/bevacizumab  08/25/2024, Udenyca  Stage I left breast cancer 1995, status post lumpectomy and left axillary dissection, adjuvant chemotherapy, radiation, and a visa Hypertension Hypothyroidism Osteoarthritis Normocytic anemia July 2025 Anorexia/weight loss secondary to #1 Anxiety/depression Family history of prostate cancer  Disposition: Elaine Simon appears stable.  She is seen today prior to beginning cycle 1 FOLFIRI/bevacizumab .  We again reviewed potential toxicities.  She agrees to proceed.  CBC and chemistry panel reviewed.  Labs are adequate for treatment.  She will return for follow-up and cycle 2 FOLFIRI/bevacizumab  09/13/2024.  We are available to see her sooner if needed.  Patient seen with Dr. Cloretta.    Elaine Simon ANP/GNP-BC   08/25/2024  10:35 AM  This was a shared  visit with Elaine Simon.  Elaine Simon has decided to proceed with FOLFIRI/bevacizumab .  We reviewed potential toxicities associated with  this regimen again today including the chance of nausea/vomiting, diarrhea, alopecia, hematologic toxicity, infection, and bleeding.  We discussed the thromboembolic disease, bleeding, hypertension, and bowel perforation associated with bevacizumab .  She agrees to proceed. A treatment plan was entered.  She will receive G-CSF support following chemotherapy. Elaine Hof, MD

## 2024-08-25 NOTE — Patient Instructions (Signed)

## 2024-08-25 NOTE — Patient Instructions (Signed)
 [facility] - A DEPT OF [facility]  Discharge Instructions: Thank you for choosing [facility] to provide your oncology and hematology care.   If you have a lab appointment with the Cancer Center, please go directly to the Cancer Center and check in at the registration area.   Wear comfortable clothing and clothing appropriate for easy access to any Portacath or PICC line.   We strive to give you quality time with your provider. You may need to reschedule your appointment if you arrive late (15 or more minutes).  Arriving late affects you and other patients whose appointments are after yours.  Also, if you miss three or more appointments without notifying the office, you may be dismissed from the clinic at the providers discretion.      For prescription refill requests, have your pharmacy contact our office and allow 72 hours for refills to be completed.    Today you received the following chemotherapy and/or immunotherapy agents [facility] - A DEPT OF [place]  Discharge Instructions: Thank you for choosing [facility] to provide your oncology and hematology care.   If you have a lab appointment with the Cancer Center, please go directly to the Cancer Center and check in at the registration area.   Wear comfortable clothing and clothing appropriate for easy access to any Portacath or PICC line.   We strive to give you quality time with your provider. You may need to reschedule your appointment if you arrive late (15 or more minutes).  Arriving late affects you and other patients whose appointments are after yours.  Also, if you miss three or more appointments without notifying the office, you may be dismissed from the clinic at the providers discretion.      For prescription refill requests, have your pharmacy contact our office and allow 72 hours for refills to be completed.    Today you  received the following chemotherapy and/or immunotherapy agents bevacizumab , leucovorin , irinotecan , and fluorouracil .      To help prevent nausea and vomiting after your treatment, we encourage you to take your nausea medication as directed.  BELOW ARE SYMPTOMS THAT SHOULD BE REPORTED IMMEDIATELY: *FEVER GREATER THAN 100.4 F (38 C) OR HIGHER *CHILLS OR SWEATING *NAUSEA AND VOMITING THAT IS NOT CONTROLLED WITH YOUR NAUSEA MEDICATION *UNUSUAL SHORTNESS OF BREATH *UNUSUAL BRUISING OR BLEEDING *URINARY PROBLEMS (pain or burning when urinating, or frequent urination) *BOWEL PROBLEMS (unusual diarrhea, constipation, pain near the anus) TENDERNESS IN MOUTH AND THROAT WITH OR WITHOUT PRESENCE OF ULCERS (sore throat, sores in mouth, or a toothache) UNUSUAL RASH, SWELLING OR PAIN  UNUSUAL VAGINAL DISCHARGE OR ITCHING   Items with * indicate a potential emergency and should be followed up as soon as possible or go to the Emergency Department if any problems should occur.  Please show the CHEMOTHERAPY ALERT CARD or IMMUNOTHERAPY ALERT CARD at check-in to the Emergency Department and triage nurse.  Should you have questions after your visit or need to cancel or reschedule your appointment, please contact [facility]  Dept: [phone]  and follow the prompts.  Office hours are 8:00 a.m. to 4:30 p.m. Monday - Friday. Please note that voicemails left after 4:00 p.m. may not be returned until the following business day.  We are closed weekends and major holidays. You have access to a nurse at all  times for urgent questions. Please call the main number to the clinic Dept: [phone] and follow the prompts.   For any non-urgent questions, you may also contact your provider using MyChart. We now offer e-Visits for anyone 52 and older to request care online for non-urgent symptoms. For details visit [email or web address].   Also download the MyChart  app! Go to the app store, search MyChart, open the app, select [place], and log in with your MyChart username and password.        To help prevent nausea and vomiting after your treatment, we encourage you to take your nausea medication as directed.  BELOW ARE SYMPTOMS THAT SHOULD BE REPORTED IMMEDIATELY: *FEVER GREATER THAN 100.4 F (38 C) OR HIGHER *CHILLS OR SWEATING *NAUSEA AND VOMITING THAT IS NOT CONTROLLED WITH YOUR NAUSEA MEDICATION *UNUSUAL SHORTNESS OF BREATH *UNUSUAL BRUISING OR BLEEDING *URINARY PROBLEMS (pain or burning when urinating, or frequent urination) *BOWEL PROBLEMS (unusual diarrhea, constipation, pain near the anus) TENDERNESS IN MOUTH AND THROAT WITH OR WITHOUT PRESENCE OF ULCERS (sore throat, sores in mouth, or a toothache) UNUSUAL RASH, SWELLING OR PAIN  UNUSUAL VAGINAL DISCHARGE OR ITCHING   Items with * indicate a potential emergency and should be followed up as soon as possible or go to the Emergency Department if any problems should occur.  Please show the CHEMOTHERAPY ALERT CARD or IMMUNOTHERAPY ALERT CARD at check-in to the Emergency Department and triage nurse.  Should you have questions after your visit or need to cancel or reschedule your appointment, please contact [facility]  Dept: [phone]  and follow the prompts.  Office hours are 8:00 a.m. to 4:30 p.m. Monday - Friday. Please note that voicemails left after 4:00 p.m. may not be returned until the following business day.  We are closed weekends and major holidays. You have access to a nurse at all times for urgent questions. Please call the main number to the clinic Dept: [phone] and follow the prompts.   For any non-urgent questions, you may also contact your provider using MyChart. We now offer e-Visits for anyone 21 and older to request care online for non-urgent symptoms. For details visit [email or web address].   Also  download the MyChart app! Go to the app store, search MyChart, open the app, select [place], and log in with your MyChart username and password.

## 2024-08-26 ENCOUNTER — Telehealth: Payer: Self-pay | Admitting: *Deleted

## 2024-08-26 ENCOUNTER — Inpatient Hospital Stay

## 2024-08-26 ENCOUNTER — Telehealth: Payer: Self-pay

## 2024-08-26 DIAGNOSIS — C179 Malignant neoplasm of small intestine, unspecified: Secondary | ICD-10-CM

## 2024-08-26 MED ORDER — LORAZEPAM 0.5 MG PO TABS: 0.5000 mg | ORAL_TABLET | Freq: Three times a day (TID) | ORAL | 0 refills | Status: DC

## 2024-08-26 NOTE — Telephone Encounter (Signed)
 Sent is script for lorazepam  0.5 mg every 8 hours prn. Patient notified. Encouraged her to reach out to her CSW and to spend time in prayer and the scriptures. This RN sent message to CSW asking for her to reach out as well.

## 2024-08-26 NOTE — Telephone Encounter (Signed)
 Elaine Simon called to speak with NP about her inability to sleep and anxiety. She is anxious and constantly thinks about her situation and her mortality. She is speaking with CSW at [facility] about this. Confirmed she is on Trazodone 150 mg at betime and still can't sleep. She had script for xanax  in past from Dr. Gudena, but she does not have any more. Wants to try something else for anxiety-RN mentioned lorazepam  and she would like to try this if possible or something else if NP is aware of what could help with these two issues.

## 2024-08-26 NOTE — Telephone Encounter (Signed)
 The patient contacted the office to report that she had a bowel movement during which she observed blood in the toilet. Additionally, she noted a small amount of light pink blood when wiping. She was advised to contact the office should she experience any rectal bleeding. The patient understood the instructions and will follow up with us  if the issue recurs.

## 2024-08-27 ENCOUNTER — Inpatient Hospital Stay

## 2024-08-27 ENCOUNTER — Other Ambulatory Visit: Payer: Self-pay | Admitting: Nurse Practitioner

## 2024-08-27 ENCOUNTER — Telehealth: Payer: Self-pay | Admitting: *Deleted

## 2024-08-27 VITALS — BP 143/71 | HR 65 | Temp 98.2°F | Resp 18

## 2024-08-27 DIAGNOSIS — Z5111 Encounter for antineoplastic chemotherapy: Secondary | ICD-10-CM | POA: Diagnosis not present

## 2024-08-27 DIAGNOSIS — C179 Malignant neoplasm of small intestine, unspecified: Secondary | ICD-10-CM

## 2024-08-27 MED ORDER — PEGFILGRASTIM-CBQV 6 MG/0.6ML ~~LOC~~ SOSY
6.0000 mg | PREFILLED_SYRINGE | Freq: Once | SUBCUTANEOUS | Status: AC
  Administered 2024-08-27: 6 mg via SUBCUTANEOUS
  Filled 2024-08-27: qty 0.6

## 2024-08-27 NOTE — Patient Instructions (Signed)

## 2024-08-27 NOTE — Telephone Encounter (Signed)
 VM left by pharmacist at [facility] asking about lorazepam  script sent on 08/26/24. He wanted office aware that she still has script from PCP, Dr. Shelda for Alprazolam  1 mg. Per website she last picked up med on 05/30/2024 for #90. Pharmacy said she is due to come for refill in 2 days. Wanted office aware. Called pharmacy x 2 and put on hold for over 20 minutes each time with no answer. Called back 3rd time and left VM to cancel the lorazepam  script we sent for her per Olam Ned, NP.  Patient had told this RN she was out of Alprazolam  and wanted to try something else for her anxiety.

## 2024-08-27 NOTE — Progress Notes (Signed)
 [facility] CSW Progress Note  Clinical Social Work Intern contacted patient by phone per referral from RN to follow-up on emotional support. Patient reports feeling she is fighting a battle and is struggling to adjust to realities of illness and treatment.     Interventions: Provided brief mental health counseling with regard to adjusting to illness and related fears and anxiety. Reviewed grounding practices such as visualization, journaling, and affirmations, which patient reports have helped in the past few weeks. Validated use of prayer and social supports as a protective factor Teacher, Adult Education, bible study) Encouraged normal routines and activities pt enjoys as able, such as getting nails done.       Follow Up Plan:  CSW will see patient on 11/17.    Elaine Simon Clinical Social Work Intern [employer] [employer]

## 2024-08-28 ENCOUNTER — Inpatient Hospital Stay

## 2024-08-31 ENCOUNTER — Inpatient Hospital Stay

## 2024-08-31 ENCOUNTER — Inpatient Hospital Stay: Admitting: Oncology

## 2024-09-01 ENCOUNTER — Inpatient Hospital Stay

## 2024-09-01 NOTE — Progress Notes (Signed)
 [facility] CSW Counseling Note  Patient was referred by self. Treatment type: Individual   Presenting Concerns: Patient and/or family reports the following symptoms/concerns: depression and stress Duration of problem: since diagnosis approx 5 months ago ; Severity of problem: moderate   Orientation:oriented to person, place, and time/date.   Affect: Appropriate and Congruent Risk of harm to self or others: No plan to harm self or others  Patient and/or Family's Strengths/Protective Factors: Social connections, Social and Emotional competence, Concrete supports in place (healthy food, safe environments, etc.), and ability for insight.Active sense of humor  Average or above average intelligence  Capable of independent living  Communication skills  General fund of knowledge  Motivation for treatment/growth  Religious Affiliation  Supportive family/friends      Goals Addressed: Patient will:  Reduce symptoms of: depression and stress  Increase knowledge and/or ability of:  coping skills and self-management skills.  Increase healthy adjustment to current life circumstances    Progress towards Goals: Progressing   Interventions: Interventions utilized:  Mining Engineer, Supportive Counseling, Communication Skills, and ACT (Acceptance and Commitment Therapy)      08/25/2024   10:00 AM 08/05/2024    9:00 AM 07/13/2024    9:34 AM 07/02/2024    1:47 PM 06/30/2024    9:00 AM  Depression screen PHQ 2/9  Decreased Interest 1 1 1 1 1   Down, Depressed, Hopeless 1 1 1 1 1   PHQ - 2 Score 2 2 2 2 2   Altered sleeping 1 1 0  1  Tired, decreased energy 1 1 1  1   Change in appetite 1 1 0  1  Feeling bad or failure about yourself  1 1 0  0  Trouble concentrating 1 2 0  0  Moving slowly or fidgety/restless 1 2 0  0  Suicidal thoughts 1 0 0  0  PHQ-9 Score 9 10 3   5       Data saved with a previous flowsheet row definition         06/11/2024    4:10 PM 08/09/2019    4:34 PM   GAD 7 : Generalized Anxiety Score  Nervous, Anxious, on Edge 2 2  Control/stop worrying 2 3  Worry too much - different things 1 2  Trouble relaxing 0 2  Restless 0 0  Easily annoyed or irritable 1 3  Afraid - awful might happen 1 3  Total GAD 7 Score 7 15  Anxiety Difficulty Somewhat difficult        Assessment:  Patient reports reduced stress since last session which she attributes to decreased symptoms with new chemo treatment, as well as improved medication management. Patient spoke of goals and vision once treatment is complete, such as helping others through sharing her testimony. Patient explored what kind of legacy she wants to leave, which includes themes of love, happiness, and sharing what God has done for her. CSW Intern and patient shifted the perspective that patient doesn't have to wait until she is healed to live into these values, identifying ways pt can incorporate these themes into her everyday life now.  Patient shared doubts about medical decisions she made previously for her sister, as she struggles to make end of life decisions of her own. Intern assisted patient in processing grief and reframing prior mistakes as the unfolding of a larger plan, leaning on patient's faith as a guide and support.   Patient expressed ongoing interpersonal tension re her boundaries not being respected. Patient and intern  problem solved and role played practical ways to establish and support pt's autonomy and independence while still getting her needs met.      Plan: Follow up with CSW: Jan 2026 Behavioral recommendations: Implement strategies for boundary setting practiced in session; Journal about legacy words and use them as a guide for small daily actions.  Referral(s): Next GI Support Group in January      Thersia KATHEE Daring Clinical Social Work Intern [facility]

## 2024-09-02 ENCOUNTER — Inpatient Hospital Stay

## 2024-09-02 NOTE — Progress Notes (Signed)
 I reviewed patient visit with social work Tax inspector. I concur with the treatment plan as documented in the SW intern's note.   Analis Distler E Siaosi Alter, LCSW Clinical Child psychotherapist

## 2024-09-03 NOTE — Telephone Encounter (Signed)
 Called to report she had a brief nosebleed and was concerned. Made her aware that is common with her regimen. Suggested a home humidifier, saline nasal spray and Vaseline in nostrils. Avoid picking or blowing hard. Call if frequency increases or nose bleed is prolonged. She understands and agrees.

## 2024-09-12 ENCOUNTER — Other Ambulatory Visit: Payer: Self-pay | Admitting: Oncology

## 2024-09-13 ENCOUNTER — Inpatient Hospital Stay

## 2024-09-13 ENCOUNTER — Inpatient Hospital Stay: Admitting: Oncology

## 2024-09-13 VITALS — BP 129/83 | HR 100 | Temp 97.8°F | Resp 18 | Ht 62.0 in | Wt 167.8 lb

## 2024-09-13 DIAGNOSIS — Z5111 Encounter for antineoplastic chemotherapy: Secondary | ICD-10-CM | POA: Diagnosis not present

## 2024-09-13 DIAGNOSIS — C179 Malignant neoplasm of small intestine, unspecified: Secondary | ICD-10-CM

## 2024-09-13 LAB — CMP (CANCER CENTER ONLY)
ALT: 19 U/L (ref 0–44)
AST: 29 U/L (ref 15–41)
Albumin: 4.3 g/dL (ref 3.5–5.0)
Alkaline Phosphatase: 214 U/L — ABNORMAL HIGH (ref 38–126)
Anion gap: 11 (ref 5–15)
BUN: 17 mg/dL (ref 8–23)
CO2: 24 mmol/L (ref 22–32)
Calcium: 9.8 mg/dL (ref 8.9–10.3)
Chloride: 106 mmol/L (ref 98–111)
Creatinine: 0.79 mg/dL (ref 0.44–1.00)
GFR, Estimated: >60 mL/min
Glucose, Bld: 121 mg/dL — ABNORMAL HIGH (ref 70–99)
Potassium: 3.9 mmol/L (ref 3.5–5.1)
Sodium: 140 mmol/L (ref 135–145)
Total Bilirubin: <0.2 mg/dL (ref 0.0–1.2)
Total Protein: 7.1 g/dL (ref 6.5–8.1)

## 2024-09-13 LAB — CBC WITH DIFFERENTIAL (CANCER CENTER ONLY)
Abs Immature Granulocytes: 0.02 K/uL (ref 0.00–0.07)
Basophils Absolute: 0 K/uL (ref 0.0–0.1)
Basophils Relative: 1 %
Eosinophils Absolute: 0.1 K/uL (ref 0.0–0.5)
Eosinophils Relative: 2 %
HCT: 31.5 % — ABNORMAL LOW (ref 36.0–46.0)
Hemoglobin: 9.9 g/dL — ABNORMAL LOW (ref 12.0–15.0)
Immature Granulocytes: 0 %
Lymphocytes Relative: 39 %
Lymphs Abs: 2.1 K/uL (ref 0.7–4.0)
MCH: 26.8 pg (ref 26.0–34.0)
MCHC: 31.4 g/dL (ref 30.0–36.0)
MCV: 85.1 fL (ref 80.0–100.0)
Monocytes Absolute: 0.3 K/uL (ref 0.1–1.0)
Monocytes Relative: 6 %
Neutro Abs: 2.8 K/uL (ref 1.7–7.7)
Neutrophils Relative %: 52 %
Platelet Count: 208 K/uL (ref 150–400)
RBC: 3.7 MIL/uL — ABNORMAL LOW (ref 3.87–5.11)
RDW: 20.3 % — ABNORMAL HIGH (ref 11.5–15.5)
WBC Count: 5.3 K/uL (ref 4.0–10.5)
nRBC: 0 % (ref 0.0–0.2)

## 2024-09-13 MED ORDER — PALONOSETRON HCL INJECTION 0.25 MG/5ML
0.2500 mg | Freq: Once | INTRAVENOUS | Status: AC
  Administered 2024-09-13: 0.25 mg via INTRAVENOUS
  Filled 2024-09-13: qty 5

## 2024-09-13 MED ORDER — DEXAMETHASONE SOD PHOSPHATE PF 10 MG/ML IJ SOLN
10.0000 mg | Freq: Once | INTRAMUSCULAR | Status: AC
  Administered 2024-09-13: 10 mg via INTRAVENOUS

## 2024-09-13 MED ORDER — SODIUM CHLORIDE 0.9 % IV SOLN
180.0000 mg/m2 | Freq: Once | INTRAVENOUS | Status: AC
  Administered 2024-09-13: 320 mg via INTRAVENOUS
  Filled 2024-09-13: qty 15

## 2024-09-13 MED ORDER — SODIUM CHLORIDE 0.9 % IV SOLN
1200.0000 mg/m2 | INTRAVENOUS | Status: DC
  Administered 2024-09-13: 2000 mg via INTRAVENOUS
  Filled 2024-09-13: qty 40

## 2024-09-13 MED ORDER — SODIUM CHLORIDE 0.9 % IV SOLN
5.0000 mg/kg | Freq: Once | INTRAVENOUS | Status: AC
  Administered 2024-09-13: 400 mg via INTRAVENOUS
  Filled 2024-09-13: qty 16

## 2024-09-13 MED ORDER — ATROPINE SULFATE 1 MG/ML IV SOLN
0.5000 mg | Freq: Once | INTRAVENOUS | Status: AC | PRN
  Administered 2024-09-13: 0.5 mg via INTRAVENOUS
  Filled 2024-09-13: qty 1

## 2024-09-13 MED ORDER — SODIUM CHLORIDE 0.9 % IV SOLN
400.0000 mg/m2 | Freq: Once | INTRAVENOUS | Status: AC
  Administered 2024-09-13: 732 mg via INTRAVENOUS
  Filled 2024-09-13: qty 36.6

## 2024-09-13 MED ORDER — SODIUM CHLORIDE 0.9 % IV SOLN: INTRAVENOUS | Status: DC

## 2024-09-13 NOTE — Progress Notes (Signed)
 " Elaine Simon   Diagnosis: Small bowel carcinoma  INTERVAL HISTORY:   Elaine Simon completed a cycle of FOLFIRI/bevacizumab  on 08/25/2024.  No nausea/vomiting, mouth sores, or diarrhea.  She reports a dry cough .  The cough is intermittent.  No dyspnea or chest pain.  No leg swelling or pain.  She had a mild nosebleed 09/03/2024.  She also reports an episode of blood in the toilet bowl.  Objective:  Vital signs in last 24 hours:  Blood pressure 129/83, pulse 100, temperature 97.8 F (36.6 C), resp. rate 18, height 5' 2 (1.575 m), weight 167 lb 12.8 oz (76.1 kg), SpO2 98%.    HEENT: No thrush or ulcers, hyperpigmentation of the palate and buccal mucosa Resp: Lungs clear bilaterally Cardio: Regular rate and rhythm, grouped beats GI: No hepatosplenomegaly, nontender Vascular: No leg edema Skin: Palms without erythema  Portacath/PICC-without erythema  Lab Results:  Lab Results  Component Value Date   WBC 5.3 09/13/2024   HGB 9.9 (L) 09/13/2024   HCT 31.5 (L) 09/13/2024   MCV 85.1 09/13/2024   PLT 208 09/13/2024   NEUTROABS 2.8 09/13/2024    CMP  Lab Results  Component Value Date   NA 140 09/13/2024   K 3.9 09/13/2024   CL 106 09/13/2024   CO2 24 09/13/2024   GLUCOSE 121 (H) 09/13/2024   BUN 17 09/13/2024   CREATININE 0.79 09/13/2024   CALCIUM  9.8 09/13/2024   PROT 7.1 09/13/2024   ALBUMIN 4.3 09/13/2024   AST 29 09/13/2024   ALT 19 09/13/2024   ALKPHOS 214 (H) 09/13/2024   BILITOT <0.2 09/13/2024   GFRNONAA >60 09/13/2024   GFRAA 75 09/26/2020    Lab Results  Component Value Date   CEA 36.85 (H) 08/25/2024    Lab Results  Component Value Date   INR 1.0 04/08/2024   LABPROT 13.7 04/08/2024    Imaging:  No results found.  Medications: I have reviewed the patient's current medications.   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  Peripheral hypermetabolism in the posterior right hepatic lesion.  Several tiny bilateral pulmonary nodules measuring 3 to 5 mm, too small to characterize by PET.  Scattered foci of hypermetabolism along the length of the colon, nonspecific. Cycle 1 FOLFOX 04/27/2024 Treatment held 05/10/2024 due to neutropenia (ANC 0.7) Cycle 2 FOLFOX 05/18/2024, Udenyca  Cycle 3 FOLFOX 06/02/2024, Udenyca  Cycle 4 FOLFOX 06/15/2024, 5-FU bolus eliminated, Udenyca  Cycle 5 FOLFOX 06/30/2024, 5-FU pump dose reduced due to diarrhea, Udenyca  Cycle 6 FOLFOX 07/05/2024, Udenyca  CTs at [facility] 07/26/2024-slight interval decrease in size and degree of enhancement of the annular duodenal mass which infiltrates along the pancreaticoduodenal groove.  No significant change in size of the lobulated mass occupying much of hepatic segment 7.  Unchanged subcentimeter pulmonary nodules.  No new sites of disease.  Moderate gastroduodenal luminal distention above the level of the mass. Tumor board discussion at [facility]-continue  systemic treatment, consider adding in biologic Cycle 7 FOLFOX 08/05/2024 Cycle 1 FOLFIRI/bevacizumab  08/25/2024, Udenyca  Cycle 2 FOLFIRI/bevacizumab   09/13/2024, Udenyca  Stage I left breast cancer 1995, status post lumpectomy and left axillary dissection, adjuvant chemotherapy, radiation, and a visa Hypertension Hypothyroidism Osteoarthritis Normocytic anemia July 2025 Anorexia/weight loss secondary to #1 Anxiety/depression Family history of prostate cancer    Disposition: Elaine Simon tolerated the first cycle of FOLFIRI/bevacizumab  today.  She will call for recurrent or increased bleeding.  She will complete cycle 2 today.  The plan is to complete 4 cycles of FOLFIRI/bevacizumab  prior to a restaging evaluation.  We will check a CEA when she returns in 2 weeks. She had grouped heart beats on physical exam today.  She has a history of bigeminy on an EKG in the past.  Bigeminy was noted on physical exam when I saw her in October. Arley Hof, MD  09/13/2024  9:46 AM   "

## 2024-09-13 NOTE — Progress Notes (Signed)
 Patient seen by Dr. Arley Hof today  Vitals are within treatment parameters:Yes Pulse- 100 Labs are within treatment parameters: Yes   Treatment plan has been signed: Yes   Per physician team, Patient is ready for treatment and there are NO modifications to the treatment plan.

## 2024-09-13 NOTE — Patient Instructions (Addendum)
 [facility] - A DEPT OF [facility]  Discharge Instructions: Thank you for choosing [facility] to provide your oncology and hematology care.   If you have a lab appointment with the Cancer Center, please go directly to the Cancer Center and check in at the registration area.   Wear comfortable clothing and clothing appropriate for easy access to any Portacath or PICC line.   We strive to give you quality time with your provider. You may need to reschedule your appointment if you arrive late (15 or more minutes).  Arriving late affects you and other patients whose appointments are after yours.  Also, if you miss three or more appointments without notifying the office, you may be dismissed from the clinic at the providers discretion.      For prescription refill requests, have your pharmacy contact our office and allow 72 hours for refills to be completed.    Today you received the following chemotherapy and/or immunotherapy agents: Bevacizumab -awwb (MVASI ), Irinotecan  (Camptosar ), Leucovorin , and Fluorouracil  (adrucil , 5-FU)       To help prevent nausea and vomiting after your treatment, we encourage you to take your nausea medication as directed.  BELOW ARE SYMPTOMS THAT SHOULD BE REPORTED IMMEDIATELY: *FEVER GREATER THAN 100.4 F (38 C) OR HIGHER *CHILLS OR SWEATING *NAUSEA AND VOMITING THAT IS NOT CONTROLLED WITH YOUR NAUSEA MEDICATION *UNUSUAL SHORTNESS OF BREATH *UNUSUAL BRUISING OR BLEEDING *URINARY PROBLEMS (pain or burning when urinating, or frequent urination) *BOWEL PROBLEMS (unusual diarrhea, constipation, pain near the anus) TENDERNESS IN MOUTH AND THROAT WITH OR WITHOUT PRESENCE OF ULCERS (sore throat, sores in mouth, or a toothache) UNUSUAL RASH, SWELLING OR PAIN  UNUSUAL VAGINAL DISCHARGE OR ITCHING   Items with * indicate a potential emergency and should be followed up as soon as possible or go to the Emergency Department if  any problems should occur.  Please show the CHEMOTHERAPY ALERT CARD or IMMUNOTHERAPY ALERT CARD at check-in to the Emergency Department and triage nurse.  Should you have questions after your visit or need to cancel or reschedule your appointment, please contact [facility]PT [facility]  Dept: [phone]  and follow the prompts.  Office hours are 8:00 a.m. to 4:30 p.m. Monday - Friday. Please note that voicemails left after 4:00 p.m. may not be returned until the following business day.  We are closed weekends and major holidays. You have access to a nurse at all times for urgent questions. Please call the main number to the clinic Dept: [phone] and follow the prompts.   For any non-urgent questions, you may also contact your provider using MyChart. We now offer e-Visits for anyone 43 and older to request care online for non-urgent symptoms. For details visit [email or web address].   Also download the MyChart app! Go to the app store, search MyChart, open the app, select [facility], and log in with your MyChart username and password.

## 2024-09-13 NOTE — Patient Instructions (Signed)

## 2024-09-15 ENCOUNTER — Inpatient Hospital Stay

## 2024-09-15 VITALS — BP 132/86 | HR 85 | Temp 97.6°F | Resp 18

## 2024-09-15 DIAGNOSIS — C179 Malignant neoplasm of small intestine, unspecified: Secondary | ICD-10-CM

## 2024-09-15 DIAGNOSIS — Z5111 Encounter for antineoplastic chemotherapy: Secondary | ICD-10-CM | POA: Diagnosis not present

## 2024-09-15 MED ORDER — PEGFILGRASTIM-CBQV 6 MG/0.6ML ~~LOC~~ SOSY
6.0000 mg | PREFILLED_SYRINGE | Freq: Once | SUBCUTANEOUS | Status: AC
  Administered 2024-09-15: 6 mg via SUBCUTANEOUS
  Filled 2024-09-15: qty 0.6

## 2024-09-15 NOTE — Patient Instructions (Signed)

## 2024-09-25 ENCOUNTER — Other Ambulatory Visit: Payer: Self-pay | Admitting: Oncology

## 2024-09-28 ENCOUNTER — Inpatient Hospital Stay: Attending: Nurse Practitioner

## 2024-09-28 ENCOUNTER — Inpatient Hospital Stay

## 2024-09-28 ENCOUNTER — Inpatient Hospital Stay: Admitting: Oncology

## 2024-09-28 VITALS — BP 133/77 | HR 66 | Temp 97.7°F | Resp 18

## 2024-09-28 VITALS — BP 122/70 | HR 82 | Temp 97.8°F | Resp 18 | Ht 62.0 in | Wt 164.1 lb

## 2024-09-28 DIAGNOSIS — Z79899 Other long term (current) drug therapy: Secondary | ICD-10-CM | POA: Diagnosis not present

## 2024-09-28 DIAGNOSIS — Z79631 Long term (current) use of antimetabolite agent: Secondary | ICD-10-CM | POA: Insufficient documentation

## 2024-09-28 DIAGNOSIS — Z8042 Family history of malignant neoplasm of prostate: Secondary | ICD-10-CM | POA: Diagnosis not present

## 2024-09-28 DIAGNOSIS — K769 Liver disease, unspecified: Secondary | ICD-10-CM | POA: Diagnosis not present

## 2024-09-28 DIAGNOSIS — R630 Anorexia: Secondary | ICD-10-CM | POA: Diagnosis not present

## 2024-09-28 DIAGNOSIS — Z853 Personal history of malignant neoplasm of breast: Secondary | ICD-10-CM | POA: Insufficient documentation

## 2024-09-28 DIAGNOSIS — M545 Low back pain, unspecified: Secondary | ICD-10-CM | POA: Insufficient documentation

## 2024-09-28 DIAGNOSIS — F32A Depression, unspecified: Secondary | ICD-10-CM | POA: Insufficient documentation

## 2024-09-28 DIAGNOSIS — Z5111 Encounter for antineoplastic chemotherapy: Secondary | ICD-10-CM | POA: Diagnosis present

## 2024-09-28 DIAGNOSIS — D709 Neutropenia, unspecified: Secondary | ICD-10-CM | POA: Insufficient documentation

## 2024-09-28 DIAGNOSIS — C179 Malignant neoplasm of small intestine, unspecified: Secondary | ICD-10-CM | POA: Diagnosis not present

## 2024-09-28 DIAGNOSIS — F419 Anxiety disorder, unspecified: Secondary | ICD-10-CM | POA: Diagnosis not present

## 2024-09-28 DIAGNOSIS — G8929 Other chronic pain: Secondary | ICD-10-CM | POA: Insufficient documentation

## 2024-09-28 DIAGNOSIS — I10 Essential (primary) hypertension: Secondary | ICD-10-CM | POA: Insufficient documentation

## 2024-09-28 DIAGNOSIS — K625 Hemorrhage of anus and rectum: Secondary | ICD-10-CM | POA: Insufficient documentation

## 2024-09-28 DIAGNOSIS — R197 Diarrhea, unspecified: Secondary | ICD-10-CM | POA: Insufficient documentation

## 2024-09-28 DIAGNOSIS — Z5112 Encounter for antineoplastic immunotherapy: Secondary | ICD-10-CM | POA: Insufficient documentation

## 2024-09-28 DIAGNOSIS — E039 Hypothyroidism, unspecified: Secondary | ICD-10-CM | POA: Insufficient documentation

## 2024-09-28 DIAGNOSIS — G629 Polyneuropathy, unspecified: Secondary | ICD-10-CM | POA: Insufficient documentation

## 2024-09-28 DIAGNOSIS — Z79634 Long term (current) use of topoisomerase inhibitor: Secondary | ICD-10-CM | POA: Diagnosis not present

## 2024-09-28 DIAGNOSIS — M1990 Unspecified osteoarthritis, unspecified site: Secondary | ICD-10-CM | POA: Diagnosis not present

## 2024-09-28 DIAGNOSIS — C170 Malignant neoplasm of duodenum: Secondary | ICD-10-CM | POA: Insufficient documentation

## 2024-09-28 DIAGNOSIS — R040 Epistaxis: Secondary | ICD-10-CM | POA: Insufficient documentation

## 2024-09-28 LAB — CMP (CANCER CENTER ONLY)
ALT: 21 U/L (ref 0–44)
AST: 26 U/L (ref 15–41)
Albumin: 4.4 g/dL (ref 3.5–5.0)
Alkaline Phosphatase: 220 U/L — ABNORMAL HIGH (ref 38–126)
Anion gap: 12 (ref 5–15)
BUN: 10 mg/dL (ref 8–23)
CO2: 23 mmol/L (ref 22–32)
Calcium: 10 mg/dL (ref 8.9–10.3)
Chloride: 104 mmol/L (ref 98–111)
Creatinine: 0.76 mg/dL (ref 0.44–1.00)
GFR, Estimated: >60 mL/min
Glucose, Bld: 119 mg/dL — ABNORMAL HIGH (ref 70–99)
Potassium: 4 mmol/L (ref 3.5–5.1)
Sodium: 138 mmol/L (ref 135–145)
Total Bilirubin: <0.2 mg/dL (ref 0.0–1.2)
Total Protein: 7.5 g/dL (ref 6.5–8.1)

## 2024-09-28 LAB — CBC WITH DIFFERENTIAL (CANCER CENTER ONLY)
Abs Immature Granulocytes: 0.05 K/uL (ref 0.00–0.07)
Basophils Absolute: 0 K/uL (ref 0.0–0.1)
Basophils Relative: 0 %
Eosinophils Absolute: 0.1 K/uL (ref 0.0–0.5)
Eosinophils Relative: 1 %
HCT: 34.1 % — ABNORMAL LOW (ref 36.0–46.0)
Hemoglobin: 10.7 g/dL — ABNORMAL LOW (ref 12.0–15.0)
Immature Granulocytes: 1 %
Lymphocytes Relative: 27 %
Lymphs Abs: 2.3 K/uL (ref 0.7–4.0)
MCH: 26.6 pg (ref 26.0–34.0)
MCHC: 31.4 g/dL (ref 30.0–36.0)
MCV: 84.6 fL (ref 80.0–100.0)
Monocytes Absolute: 0.4 K/uL (ref 0.1–1.0)
Monocytes Relative: 5 %
Neutro Abs: 5.7 K/uL (ref 1.7–7.7)
Neutrophils Relative %: 66 %
Platelet Count: 323 K/uL (ref 150–400)
RBC: 4.03 MIL/uL (ref 3.87–5.11)
RDW: 19.7 % — ABNORMAL HIGH (ref 11.5–15.5)
WBC Count: 8.5 K/uL (ref 4.0–10.5)
nRBC: 0 % (ref 0.0–0.2)

## 2024-09-28 LAB — CEA (ACCESS): CEA (CHCC): 32.1 ng/mL — ABNORMAL HIGH (ref 0.00–5.00)

## 2024-09-28 LAB — TOTAL PROTEIN, URINE DIPSTICK: Protein, ur: NEGATIVE mg/dL

## 2024-09-28 MED ORDER — ATROPINE SULFATE 1 MG/ML IV SOLN
0.5000 mg | Freq: Once | INTRAVENOUS | Status: AC | PRN
  Administered 2024-09-28: 0.5 mg via INTRAVENOUS
  Filled 2024-09-28: qty 1

## 2024-09-28 MED ORDER — SODIUM CHLORIDE 0.9 % IV SOLN
400.0000 mg/m2 | Freq: Once | INTRAVENOUS | Status: AC
  Administered 2024-09-28: 720 mg via INTRAVENOUS
  Filled 2024-09-28: qty 36

## 2024-09-28 MED ORDER — SODIUM CHLORIDE 0.9 % IV SOLN
180.0000 mg/m2 | Freq: Once | INTRAVENOUS | Status: AC
  Administered 2024-09-28: 320 mg via INTRAVENOUS
  Filled 2024-09-28: qty 15

## 2024-09-28 MED ORDER — SODIUM CHLORIDE 0.9 % IV SOLN
1200.0000 mg/m2 | INTRAVENOUS | Status: DC
  Administered 2024-09-28: 2000 mg via INTRAVENOUS
  Filled 2024-09-28: qty 40

## 2024-09-28 MED ORDER — SODIUM CHLORIDE 0.9 % IV SOLN: INTRAVENOUS | Status: DC

## 2024-09-28 MED ORDER — SODIUM CHLORIDE 0.9 % IV SOLN
5.0000 mg/kg | Freq: Once | INTRAVENOUS | Status: AC
  Administered 2024-09-28: 400 mg via INTRAVENOUS
  Filled 2024-09-28: qty 16

## 2024-09-28 MED ORDER — PALONOSETRON HCL INJECTION 0.25 MG/5ML
0.2500 mg | Freq: Once | INTRAVENOUS | Status: AC
  Administered 2024-09-28: 0.25 mg via INTRAVENOUS
  Filled 2024-09-28: qty 5

## 2024-09-28 MED ORDER — DEXAMETHASONE SOD PHOSPHATE PF 10 MG/ML IJ SOLN
10.0000 mg | Freq: Once | INTRAMUSCULAR | Status: AC
  Administered 2024-09-28: 10 mg via INTRAVENOUS
  Filled 2024-09-28: qty 1

## 2024-09-28 NOTE — Patient Instructions (Signed)
 [facility] - A DEPT OF [facility]  Discharge Instructions: Thank you for choosing [facility] to provide your oncology and hematology care.   If you have a lab appointment with the Cancer Center, please go directly to the Cancer Center and check in at the registration area.   Wear comfortable clothing and clothing appropriate for easy access to any Portacath or PICC line.   We strive to give you quality time with your provider. You may need to reschedule your appointment if you arrive late (15 or more minutes).  Arriving late affects you and other patients whose appointments are after yours.  Also, if you miss three or more appointments without notifying the office, you may be dismissed from the clinic at the providers discretion.      For prescription refill requests, have your pharmacy contact our office and allow 72 hours for refills to be completed.    Today you received the following chemotherapy and/or immunotherapy agents: Bevacizumab -awwb (MVASI ), Irinotecan  (Camptosar ), Leucovorin , and Fluorouracil  (adrucil , 5-FU)       To help prevent nausea and vomiting after your treatment, we encourage you to take your nausea medication as directed.  BELOW ARE SYMPTOMS THAT SHOULD BE REPORTED IMMEDIATELY: *FEVER GREATER THAN 100.4 F (38 C) OR HIGHER *CHILLS OR SWEATING *NAUSEA AND VOMITING THAT IS NOT CONTROLLED WITH YOUR NAUSEA MEDICATION *UNUSUAL SHORTNESS OF BREATH *UNUSUAL BRUISING OR BLEEDING *URINARY PROBLEMS (pain or burning when urinating, or frequent urination) *BOWEL PROBLEMS (unusual diarrhea, constipation, pain near the anus) TENDERNESS IN MOUTH AND THROAT WITH OR WITHOUT PRESENCE OF ULCERS (sore throat, sores in mouth, or a toothache) UNUSUAL RASH, SWELLING OR PAIN  UNUSUAL VAGINAL DISCHARGE OR ITCHING   Items with * indicate a potential emergency and should be followed up as soon as possible or go to the Emergency Department if  any problems should occur.  Please show the CHEMOTHERAPY ALERT CARD or IMMUNOTHERAPY ALERT CARD at check-in to the Emergency Department and triage nurse.  Should you have questions after your visit or need to cancel or reschedule your appointment, please contact [facility]  Dept: [phone]  and follow the prompts.  Office hours are 8:00 a.m. to 4:30 p.m. Monday - Friday. Please note that voicemails left after 4:00 p.m. may not be returned until the following business day.  We are closed weekends and major holidays. You have access to a nurse at all times for urgent questions. Please call the main number to the clinic Dept: [phone] and follow the prompts.   For any non-urgent questions, you may also contact your provider using MyChart. We now offer e-Visits for anyone 43 and older to request care online for non-urgent symptoms. For details visit [email or web address].   Also download the MyChart app! Go to the app store, search MyChart, open the app, select [facility], and log in with your MyChart username and password.

## 2024-09-28 NOTE — Progress Notes (Signed)
 " [facility] OFFICE PROGRESS NOTE   Diagnosis: Small bowel carcinoma  INTERVAL HISTORY:   [name] completed another cycle of FOLFIRI/bevacizumab  on 09/13/2024.  No nausea/vomiting or mouth sores.  She reports diarrhea up to 4 times per day following chemotherapy.  Diarrhea improved with Imodium.  She continues to have neuropathy symptoms in the hands and feet.  She plans to resume gabapentin . She has chronic low back pain. She has mild nosebleeding and reports 1 episode of rectal bleeding prior to the last cycle of chemotherapy.  No other bleeding.  Objective:  Vital signs in last 24 hours:  Blood pressure 122/70, pulse 82, temperature 97.8 F (36.6 C), temperature source Temporal, resp. rate 18, height 5' 2 (1.575 m), weight 164 lb 1.6 oz (74.4 kg), SpO2 96%.    HEENT: No thrush or ulcers Resp: Lungs clear bilaterally Cardio: Regular rate and rhythm GI: No hepatosplenomegaly, nontender, no mass Vascular: No leg edema    Portacath/PICC-without erythema  Lab Results:  Lab Results  Component Value Date   WBC 8.5 09/28/2024   HGB 10.7 (L) 09/28/2024   HCT 34.1 (L) 09/28/2024   MCV 84.6 09/28/2024   PLT 323 09/28/2024   NEUTROABS 5.7 09/28/2024    CMP  Lab Results  Component Value Date   NA 138 09/28/2024   K 4.0 09/28/2024   CL 104 09/28/2024   CO2 23 09/28/2024   GLUCOSE 119 (H) 09/28/2024   BUN 10 09/28/2024   CREATININE 0.76 09/28/2024   CALCIUM  10.0 09/28/2024   PROT 7.5 09/28/2024   ALBUMIN 4.4 09/28/2024   AST 26 09/28/2024   ALT 21 09/28/2024   ALKPHOS 220 (H) 09/28/2024   BILITOT <0.2 09/28/2024   GFRNONAA >60 09/28/2024   GFRAA 75 09/26/2020    Lab Results  Component Value Date   CEA 32.10 (H) 09/28/2024   Medications: I have reviewed the patient's current medications.   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  Peripheral hypermetabolism in the posterior right hepatic lesion.  Several tiny bilateral pulmonary nodules measuring 3 to 5 mm, too small to characterize by PET.  Scattered foci of hypermetabolism along the length of the colon, nonspecific. Cycle 1 FOLFOX 04/27/2024 Treatment held 05/10/2024 due to neutropenia (ANC 0.7) Cycle 2 FOLFOX 05/18/2024, Udenyca  Cycle 3 FOLFOX 06/02/2024, Udenyca  Cycle 4 FOLFOX 06/15/2024, 5-FU bolus eliminated, Udenyca  Cycle 5 FOLFOX 06/30/2024, 5-FU pump dose reduced due to diarrhea, Udenyca  Cycle 6 FOLFOX 07/05/2024, Udenyca  CTs at [facility] 07/26/2024-slight interval decrease in size and degree of enhancement of the annular duodenal mass which infiltrates along the pancreaticoduodenal groove.  No significant change in size of the lobulated mass occupying much of hepatic segment 7.  Unchanged subcentimeter pulmonary nodules.  No new sites of disease.  Moderate gastroduodenal luminal distention above the level of the mass. Tumor board discussion at [facility]-continue systemic treatment, consider adding in biologic Cycle 7 FOLFOX 08/05/2024 Cycle 1 FOLFIRI/bevacizumab   08/25/2024, Udenyca  Cycle 2 FOLFIRI/bevacizumab  09/13/2024, Udenyca  Cycle 3  FOLFIRI/bevacizumab  09/29/2023, Udenyca  Stage I left breast cancer 1995, status post lumpectomy and left axillary dissection, adjuvant chemotherapy, radiation, and a visa Hypertension Hypothyroidism Osteoarthritis Normocytic anemia July 2025 Anorexia/weight loss secondary to #1 Anxiety/depression Family history of prostate cancer      Disposition: Elaine Simon appears stable.  She is tolerating the FOLFIRI/bevacizumab  well.  She will complete cycle 3 today.  The CEA is lower.  She will complete 5 cycles of FOLFIRI/bevacizumab  prior to a restaging CT evaluation.  [name] will return for an office visit and chemotherapy in 2 weeks.  Arley Hof, MD  09/28/2024  11:15 AM   "

## 2024-09-28 NOTE — Patient Instructions (Signed)

## 2024-09-29 ENCOUNTER — Other Ambulatory Visit: Payer: Self-pay

## 2024-09-30 ENCOUNTER — Inpatient Hospital Stay

## 2024-09-30 VITALS — BP 155/69 | HR 38 | Temp 97.9°F | Resp 18

## 2024-09-30 DIAGNOSIS — C179 Malignant neoplasm of small intestine, unspecified: Secondary | ICD-10-CM

## 2024-09-30 DIAGNOSIS — Z5112 Encounter for antineoplastic immunotherapy: Secondary | ICD-10-CM | POA: Diagnosis not present

## 2024-09-30 MED ORDER — PEGFILGRASTIM-CBQV 6 MG/0.6ML ~~LOC~~ SOSY
6.0000 mg | PREFILLED_SYRINGE | Freq: Once | SUBCUTANEOUS | Status: AC
  Administered 2024-09-30: 6 mg via SUBCUTANEOUS
  Filled 2024-09-30: qty 0.6

## 2024-09-30 NOTE — Patient Instructions (Signed)

## 2024-10-05 ENCOUNTER — Telehealth: Payer: Self-pay | Admitting: *Deleted

## 2024-10-05 NOTE — Telephone Encounter (Signed)
 [name] left VM on 1/13 asking for return call (did not state issue). Called back to day and unable to speak w/her. No answer and voice mail is full. Sent Mychart message.

## 2024-10-06 ENCOUNTER — Telehealth: Payer: Self-pay | Admitting: *Deleted

## 2024-10-06 NOTE — Telephone Encounter (Signed)
 Ms. Brook called to report that 4-6 hours after her last infusion her BP went to ~ 200/80 and she did have a headache at the same time. Few hours later was re-checked and came back down to 150's/80's. Adds that she has not taken her diltiazem  for ~ 3 months due to being on chemotherapy. She will see her PCP tomorrow to discuss. Dr. Cloretta notified.

## 2024-10-06 NOTE — Progress Notes (Signed)
 We received repeated calls from this patient saying she was trying to return a missed call. After chart investigation, it is apparent that she is a pt of Dr. Alyne. I notified their nursing staff to be aware that this patient is attempting to reach them.

## 2024-10-08 ENCOUNTER — Inpatient Hospital Stay

## 2024-10-08 NOTE — Progress Notes (Signed)
 [facility] CSW Progress Note  Clinical Warden/ranger contacted patient by phone to follow-up on need for community resources.    Interventions: Referred patient to community resources: [facility]/ [place] [facility],  Elder Justice Specialist Coordinator [phone] regarding safety planning and financial exploitation concerns. APS criteria not currently met.       Follow Up Plan:  CSW will see patient on 10/20/24 and had CSW direct contact information if additional needs arise in the meantime.    Thersia KATHEE Daring Clinical Social Work Intern [facility]

## 2024-10-10 ENCOUNTER — Other Ambulatory Visit: Payer: Self-pay | Admitting: Oncology

## 2024-10-11 ENCOUNTER — Telehealth: Payer: Self-pay

## 2024-10-11 NOTE — Telephone Encounter (Signed)
 Spoke directly with patient and advised her of her new appointment time on Tuesday 10/12/2024 starting at 1030. She was agreeable and verbalized understanding.

## 2024-10-12 ENCOUNTER — Encounter: Payer: Self-pay | Admitting: Nurse Practitioner

## 2024-10-12 ENCOUNTER — Inpatient Hospital Stay

## 2024-10-12 ENCOUNTER — Inpatient Hospital Stay: Admitting: Nurse Practitioner

## 2024-10-12 VITALS — BP 119/64 | HR 73 | Temp 98.0°F | Resp 18

## 2024-10-12 VITALS — BP 124/72 | HR 79 | Temp 97.8°F | Resp 18 | Ht 62.0 in | Wt 165.6 lb

## 2024-10-12 DIAGNOSIS — C179 Malignant neoplasm of small intestine, unspecified: Secondary | ICD-10-CM | POA: Diagnosis not present

## 2024-10-12 DIAGNOSIS — Z5112 Encounter for antineoplastic immunotherapy: Secondary | ICD-10-CM | POA: Diagnosis not present

## 2024-10-12 LAB — CBC WITH DIFFERENTIAL (CANCER CENTER ONLY)
Abs Immature Granulocytes: 0.09 10*3/uL — ABNORMAL HIGH (ref 0.00–0.07)
Basophils Absolute: 0 10*3/uL (ref 0.0–0.1)
Basophils Relative: 0 %
Eosinophils Absolute: 0.1 10*3/uL (ref 0.0–0.5)
Eosinophils Relative: 1 %
HCT: 34 % — ABNORMAL LOW (ref 36.0–46.0)
Hemoglobin: 10.6 g/dL — ABNORMAL LOW (ref 12.0–15.0)
Immature Granulocytes: 1 %
Lymphocytes Relative: 19 %
Lymphs Abs: 2 10*3/uL (ref 0.7–4.0)
MCH: 26.5 pg (ref 26.0–34.0)
MCHC: 31.2 g/dL (ref 30.0–36.0)
MCV: 85 fL (ref 80.0–100.0)
Monocytes Absolute: 0.5 10*3/uL (ref 0.1–1.0)
Monocytes Relative: 4 %
Neutro Abs: 7.9 10*3/uL — ABNORMAL HIGH (ref 1.7–7.7)
Neutrophils Relative %: 75 %
Platelet Count: 302 10*3/uL (ref 150–400)
RBC: 4 MIL/uL (ref 3.87–5.11)
RDW: 20.3 % — ABNORMAL HIGH (ref 11.5–15.5)
WBC Count: 10.5 10*3/uL (ref 4.0–10.5)
nRBC: 0 % (ref 0.0–0.2)

## 2024-10-12 LAB — CMP (CANCER CENTER ONLY)
ALT: 31 U/L (ref 0–44)
AST: 25 U/L (ref 15–41)
Albumin: 4.4 g/dL (ref 3.5–5.0)
Alkaline Phosphatase: 276 U/L — ABNORMAL HIGH (ref 38–126)
Anion gap: 12 (ref 5–15)
BUN: 14 mg/dL (ref 8–23)
CO2: 25 mmol/L (ref 22–32)
Calcium: 10.3 mg/dL (ref 8.9–10.3)
Chloride: 104 mmol/L (ref 98–111)
Creatinine: 0.86 mg/dL (ref 0.44–1.00)
GFR, Estimated: >60 mL/min
Glucose, Bld: 105 mg/dL — ABNORMAL HIGH (ref 70–99)
Potassium: 4.3 mmol/L (ref 3.5–5.1)
Sodium: 140 mmol/L (ref 135–145)
Total Bilirubin: 0.2 mg/dL (ref 0.0–1.2)
Total Protein: 7.6 g/dL (ref 6.5–8.1)

## 2024-10-12 LAB — CEA (ACCESS): CEA (CHCC): 29.02 ng/mL — ABNORMAL HIGH (ref 0.00–5.00)

## 2024-10-12 MED ORDER — SODIUM CHLORIDE 0.9 % IV SOLN
1200.0000 mg/m2 | INTRAVENOUS | Status: DC
  Administered 2024-10-12: 2000 mg via INTRAVENOUS
  Filled 2024-10-12: qty 40

## 2024-10-12 MED ORDER — SODIUM CHLORIDE 0.9 % IV SOLN
400.0000 mg/m2 | Freq: Once | INTRAVENOUS | Status: AC
  Administered 2024-10-12: 720 mg via INTRAVENOUS
  Filled 2024-10-12: qty 36

## 2024-10-12 MED ORDER — SODIUM CHLORIDE 0.9 % IV SOLN: INTRAVENOUS | Status: DC

## 2024-10-12 MED ORDER — SODIUM CHLORIDE 0.9 % IV SOLN
5.0000 mg/kg | Freq: Once | INTRAVENOUS | Status: AC
  Administered 2024-10-12: 400 mg via INTRAVENOUS
  Filled 2024-10-12: qty 16

## 2024-10-12 MED ORDER — DEXAMETHASONE SOD PHOSPHATE PF 10 MG/ML IJ SOLN
10.0000 mg | Freq: Once | INTRAMUSCULAR | Status: AC
  Administered 2024-10-12: 10 mg via INTRAVENOUS
  Filled 2024-10-12: qty 1

## 2024-10-12 MED ORDER — SODIUM CHLORIDE 0.9 % IV SOLN
180.0000 mg/m2 | Freq: Once | INTRAVENOUS | Status: AC
  Administered 2024-10-12: 320 mg via INTRAVENOUS
  Filled 2024-10-12: qty 15

## 2024-10-12 MED ORDER — ATROPINE SULFATE 1 MG/ML IV SOLN
0.5000 mg | Freq: Once | INTRAVENOUS | Status: AC | PRN
  Administered 2024-10-12: 0.5 mg via INTRAVENOUS
  Filled 2024-10-12: qty 1

## 2024-10-12 MED ORDER — PALONOSETRON HCL INJECTION 0.25 MG/5ML
0.2500 mg | Freq: Once | INTRAVENOUS | Status: AC
  Administered 2024-10-12: 0.25 mg via INTRAVENOUS
  Filled 2024-10-12: qty 5

## 2024-10-12 NOTE — Progress Notes (Addendum)
 Patient seen by Olam Ned NP today  Vitals are within treatment parameters:Yes   Labs are within treatment parameters: Yes  09/28/24- Total Protein Urine: Negative. Per provider will use that result. Treatment plan has been signed: Yes   Per physician team, Patient is ready for treatment and there are NO modifications to the treatment plan.

## 2024-10-12 NOTE — Progress Notes (Signed)
 " [facility] OFFICE PROGRESS NOTE   Diagnosis: Small bowel carcinoma  INTERVAL HISTORY:   Elaine Simon returns as scheduled.  She completed cycle 3 FOLFIRI/bevacizumab  09/28/2024.  She denies nausea/vomiting.  No mouth sores.  She tends to have diarrhea for 2 or 3 days with each treatment.  Imodium relieves the diarrhea.  She denies bleeding.  No abdominal pain.  She reports intermittent dull headaches recently.  She took a Vicodin tablet with relief.  No vision change.  She reports blood pressure has been running higher than typical.  She saw her PCP and has resumed diltiazem .  Objective:  Vital signs in last 24 hours:  Blood pressure 124/72, pulse 79, temperature 97.8 F (36.6 C), temperature source Temporal, resp. rate 18, height 5' 2 (1.575 m), weight 165 lb 9.6 oz (75.1 kg), SpO2 96%.    HEENT: No thrush or ulcers. Resp: Lungs clear bilaterally. Cardio: Regular rate and rhythm. GI: No hepatosplenomegaly.  No mass.  Nontender. Vascular: No leg edema. Neuro: Alert and oriented. Skin: Palms without erythema. Port-A-Cath without erythema.  Lab Results:  Lab Results  Component Value Date   WBC 8.5 09/28/2024   HGB 10.7 (L) 09/28/2024   HCT 34.1 (L) 09/28/2024   MCV 84.6 09/28/2024   PLT 323 09/28/2024   NEUTROABS 5.7 09/28/2024    Imaging:  No results found.  Medications: I have reviewed the patient's current medications.  Assessment/Plan: Duodenal carcinoma 04/04/2024-CT abdomen/pelvis: Heterogenous partially exophytic mass in the posterior dome of the right liver, heterogenous low-attenuation mass in the descending duodenum with extraluminal extension 04/06/2024 upper endoscopy: Large mass in the second portion of the duodenum, completely obstructing, ulcerated.  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  Peripheral hypermetabolism in the posterior right hepatic lesion.  Several tiny bilateral pulmonary nodules measuring 3 to 5 mm, too small to characterize by PET.  Scattered foci of hypermetabolism along the length of the colon, nonspecific. Cycle 1 FOLFOX 04/27/2024 Treatment held 05/10/2024 due to neutropenia (ANC 0.7) Cycle 2 FOLFOX 05/18/2024, Udenyca  Cycle 3 FOLFOX 06/02/2024, Udenyca  Cycle 4 FOLFOX 06/15/2024, 5-FU bolus eliminated, Udenyca  Cycle 5 FOLFOX 06/30/2024, 5-FU pump dose reduced due to diarrhea, Udenyca  Cycle 6 FOLFOX 07/05/2024, Udenyca  CTs at [facility] 07/26/2024-slight interval decrease in size and degree of enhancement of the annular duodenal mass which infiltrates along the pancreaticoduodenal groove.  No significant change in size of the lobulated mass occupying much of hepatic segment 7.  Unchanged subcentimeter pulmonary nodules.  No new sites of disease.  Moderate gastroduodenal luminal distention above the level of the mass. 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  She has completed 3 cycles of FOLFIRI/bevacizumab .  Overall tolerating treatment well.  Plan to proceed with cycle 4 today.  Restaging CTs after cycle 5.  CBC and chemistry panel reviewed.  Labs adequate for treatment.  She recently resumed diltiazem  for hypertension.  Blood pressure adequate today.  She will continue to monitor and contact the office with persistent high readings.  She will return for follow-up and cycle 5 FOLFIRI/bevacizumab  in 2 weeks.  She will contact the office in the interim with any problems.  Elaine Simon ANP/GNP-BC   10/12/2024  10:47 AM        "

## 2024-10-12 NOTE — Patient Instructions (Signed)

## 2024-10-12 NOTE — Patient Instructions (Signed)
 [facility] - A DEPT OF [facility]  Discharge Instructions: Thank you for choosing [facility] to provide your oncology and hematology care.   If you have a lab appointment with the Cancer Center, please go directly to the Cancer Center and check in at the registration area.   Wear comfortable clothing and clothing appropriate for easy access to any Portacath or PICC line.   We strive to give you quality time with your provider. You may need to reschedule your appointment if you arrive late (15 or more minutes).  Arriving late affects you and other patients whose appointments are after yours.  Also, if you miss three or more appointments without notifying the office, you may be dismissed from the clinic at the providers discretion.      For prescription refill requests, have your pharmacy contact our office and allow 72 hours for refills to be completed.    Today you received the following chemotherapy and/or immunotherapy agents: Bevacizumab -awwb (MVASI ), Irinotecan  (Camptosar ), Leucovorin , and Fluorouracil  (adrucil , 5-FU)       To help prevent nausea and vomiting after your treatment, we encourage you to take your nausea medication as directed.  BELOW ARE SYMPTOMS THAT SHOULD BE REPORTED IMMEDIATELY: *FEVER GREATER THAN 100.4 F (38 C) OR HIGHER *CHILLS OR SWEATING *NAUSEA AND VOMITING THAT IS NOT CONTROLLED WITH YOUR NAUSEA MEDICATION *UNUSUAL SHORTNESS OF BREATH *UNUSUAL BRUISING OR BLEEDING *URINARY PROBLEMS (pain or burning when urinating, or frequent urination) *BOWEL PROBLEMS (unusual diarrhea, constipation, pain near the anus) TENDERNESS IN MOUTH AND THROAT WITH OR WITHOUT PRESENCE OF ULCERS (sore throat, sores in mouth, or a toothache) UNUSUAL RASH, SWELLING OR PAIN  UNUSUAL VAGINAL DISCHARGE OR ITCHING   Items with * indicate a potential emergency and should be followed up as soon as possible or go to the Emergency Department if  any problems should occur.  Please show the CHEMOTHERAPY ALERT CARD or IMMUNOTHERAPY ALERT CARD at check-in to the Emergency Department and triage nurse.  Should you have questions after your visit or need to cancel or reschedule your appointment, please contact [facility]  Dept: [phone]  and follow the prompts.  Office hours are 8:00 a.m. to 4:30 p.m. Monday - Friday. Please note that voicemails left after 4:00 p.m. may not be returned until the following business day.  We are closed weekends and major holidays. You have access to a nurse at all times for urgent questions. Please call the main number to the clinic Dept: [phone] and follow the prompts.   For any non-urgent questions, you may also contact your provider using MyChart. We now offer e-Visits for anyone 43 and older to request care online for non-urgent symptoms. For details visit [email or web address].   Also download the MyChart app! Go to the app store, search MyChart, open the app, select [facility], and log in with your MyChart username and password.

## 2024-10-14 ENCOUNTER — Inpatient Hospital Stay

## 2024-10-14 VITALS — BP 130/65 | HR 61 | Temp 98.3°F | Resp 18

## 2024-10-14 DIAGNOSIS — C179 Malignant neoplasm of small intestine, unspecified: Secondary | ICD-10-CM

## 2024-10-14 DIAGNOSIS — Z5112 Encounter for antineoplastic immunotherapy: Secondary | ICD-10-CM | POA: Diagnosis not present

## 2024-10-14 MED ORDER — PEGFILGRASTIM-CBQV 6 MG/0.6ML ~~LOC~~ SOSY
6.0000 mg | PREFILLED_SYRINGE | Freq: Once | SUBCUTANEOUS | Status: AC
  Administered 2024-10-14: 6 mg via SUBCUTANEOUS
  Filled 2024-10-14: qty 0.6

## 2024-10-14 NOTE — Patient Instructions (Signed)

## 2024-10-18 ENCOUNTER — Other Ambulatory Visit: Payer: Self-pay | Admitting: Cardiology

## 2024-10-20 ENCOUNTER — Inpatient Hospital Stay

## 2024-10-20 ENCOUNTER — Telehealth: Payer: Self-pay

## 2024-10-20 NOTE — Telephone Encounter (Signed)
 CSW Intern returned call from patient regarding rescheduling counseling appointment originally scheduled from 10/20/24. Apptointment rescheduled for 11/03/24. Patient in agreement with plan.  Thersia Daring Clinical Social Work Intern [employer]

## 2024-10-25 ENCOUNTER — Inpatient Hospital Stay

## 2024-10-25 ENCOUNTER — Inpatient Hospital Stay: Admitting: Nurse Practitioner

## 2024-10-27 ENCOUNTER — Inpatient Hospital Stay

## 2024-11-08 ENCOUNTER — Inpatient Hospital Stay: Admitting: Oncology

## 2024-11-08 ENCOUNTER — Inpatient Hospital Stay

## 2024-11-10 ENCOUNTER — Inpatient Hospital Stay
